# Patient Record
Sex: Male | Born: 1940
Health system: Southern US, Community
[De-identification: ages and names within clinical notes are randomized; demographics above are authoritative.]

## PROBLEM LIST (undated history)

## (undated) ENCOUNTER — Emergency Department (HOSPITAL_COMMUNITY): Admission: EM | Payer: Medicare Other | Source: Home / Self Care

## (undated) DIAGNOSIS — I8390 Asymptomatic varicose veins of unspecified lower extremity: Secondary | ICD-10-CM

## (undated) DIAGNOSIS — Z8709 Personal history of other diseases of the respiratory system: Secondary | ICD-10-CM

## (undated) HISTORY — PX: APPENDECTOMY: SHX54

## (undated) HISTORY — PX: OTHER SURGICAL HISTORY: SHX169

## (undated) HISTORY — PX: TONSILLECTOMY: SUR1361

## (undated) HISTORY — DX: Asymptomatic varicose veins of unspecified lower extremity: I83.90

---

## 2004-06-19 ENCOUNTER — Encounter: Payer: Self-pay | Admitting: Family Medicine

## 2007-09-04 ENCOUNTER — Encounter: Payer: Self-pay | Admitting: Family Medicine

## 2008-08-27 ENCOUNTER — Ambulatory Visit: Payer: Self-pay | Admitting: Family Medicine

## 2008-08-27 DIAGNOSIS — K219 Gastro-esophageal reflux disease without esophagitis: Secondary | ICD-10-CM | POA: Insufficient documentation

## 2008-08-27 DIAGNOSIS — R5381 Other malaise: Secondary | ICD-10-CM | POA: Insufficient documentation

## 2008-08-27 DIAGNOSIS — R5383 Other fatigue: Secondary | ICD-10-CM

## 2008-08-27 DIAGNOSIS — K921 Melena: Secondary | ICD-10-CM | POA: Insufficient documentation

## 2008-08-27 DIAGNOSIS — R0989 Other specified symptoms and signs involving the circulatory and respiratory systems: Secondary | ICD-10-CM

## 2008-08-27 DIAGNOSIS — F341 Dysthymic disorder: Secondary | ICD-10-CM | POA: Insufficient documentation

## 2008-08-27 DIAGNOSIS — R0609 Other forms of dyspnea: Secondary | ICD-10-CM | POA: Insufficient documentation

## 2008-08-29 ENCOUNTER — Ambulatory Visit: Payer: Self-pay | Admitting: Gastroenterology

## 2008-08-30 ENCOUNTER — Ambulatory Visit: Payer: Self-pay | Admitting: Family Medicine

## 2008-08-30 DIAGNOSIS — I872 Venous insufficiency (chronic) (peripheral): Secondary | ICD-10-CM | POA: Insufficient documentation

## 2008-08-30 DIAGNOSIS — F39 Unspecified mood [affective] disorder: Secondary | ICD-10-CM | POA: Insufficient documentation

## 2008-09-11 ENCOUNTER — Ambulatory Visit: Payer: Self-pay | Admitting: Gastroenterology

## 2008-09-11 LAB — HM COLONOSCOPY

## 2008-10-22 ENCOUNTER — Telehealth: Payer: Self-pay | Admitting: Family Medicine

## 2008-12-20 ENCOUNTER — Ambulatory Visit: Payer: Self-pay | Admitting: Family Medicine

## 2009-01-16 ENCOUNTER — Telehealth: Payer: Self-pay | Admitting: Family Medicine

## 2010-02-18 ENCOUNTER — Ambulatory Visit: Payer: Self-pay | Admitting: Family Medicine

## 2010-02-26 ENCOUNTER — Telehealth: Payer: Self-pay | Admitting: Family Medicine

## 2010-03-13 ENCOUNTER — Telehealth: Payer: Self-pay | Admitting: *Deleted

## 2010-03-13 ENCOUNTER — Ambulatory Visit: Payer: Self-pay | Admitting: Family Medicine

## 2010-03-13 DIAGNOSIS — J45909 Unspecified asthma, uncomplicated: Secondary | ICD-10-CM | POA: Insufficient documentation

## 2010-08-02 LAB — CONVERTED CEMR LAB
ALT: 22 units/L (ref 0–53)
ALT: 25 units/L (ref 0–53)
AST: 21 units/L (ref 0–37)
AST: 22 units/L (ref 0–37)
Albumin: 4.1 g/dL (ref 3.5–5.2)
Albumin: 4.2 g/dL (ref 3.5–5.2)
Alkaline Phosphatase: 73 units/L (ref 39–117)
Alkaline Phosphatase: 75 units/L (ref 39–117)
BUN: 19 mg/dL (ref 6–23)
BUN: 24 mg/dL — ABNORMAL HIGH (ref 6–23)
Basophils Absolute: 0 10*3/uL (ref 0.0–0.1)
Basophils Absolute: 0 10*3/uL (ref 0.0–0.1)
Basophils Relative: 0 % (ref 0.0–3.0)
Basophils Relative: 0.4 % (ref 0.0–3.0)
Bilirubin Urine: NEGATIVE
Bilirubin Urine: NEGATIVE
Bilirubin, Direct: 0.2 mg/dL (ref 0.0–0.3)
Bilirubin, Direct: 0.2 mg/dL (ref 0.0–0.3)
Blood in Urine, dipstick: NEGATIVE
CO2: 30 meq/L (ref 19–32)
CO2: 30 meq/L (ref 19–32)
Calcium: 9 mg/dL (ref 8.4–10.5)
Calcium: 9.4 mg/dL (ref 8.4–10.5)
Chloride: 102 meq/L (ref 96–112)
Chloride: 102 meq/L (ref 96–112)
Cholesterol, target level: 200 mg/dL
Cholesterol: 167 mg/dL (ref 0–200)
Cholesterol: 173 mg/dL (ref 0–200)
Creatinine, Ser: 1.1 mg/dL (ref 0.4–1.5)
Creatinine, Ser: 1.1 mg/dL (ref 0.4–1.5)
Eosinophils Absolute: 0.2 10*3/uL (ref 0.0–0.7)
Eosinophils Absolute: 0.2 10*3/uL (ref 0.0–0.7)
Eosinophils Relative: 2.1 % (ref 0.0–5.0)
Eosinophils Relative: 3.2 % (ref 0.0–5.0)
Folate: >20 ng/mL
Free T4: 0.7 ng/dL (ref 0.6–1.6)
GFR calc Af Amer: 86 mL/min
GFR calc non Af Amer: 68.06 mL/min (ref >60)
GFR calc non Af Amer: 71 mL/min
Glucose, Bld: 91 mg/dL (ref 70–99)
Glucose, Bld: 91 mg/dL (ref 70–99)
Glucose, Urine, Semiquant: NEGATIVE
Glucose, Urine, Semiquant: NEGATIVE
HCT: 48.4 % (ref 39.0–52.0)
HCT: 49.6 % (ref 39.0–52.0)
HDL goal, serum: 40 mg/dL
HDL: 43.8 mg/dL (ref >39.00)
HDL: 44.3 mg/dL (ref >39.0)
Hemoglobin: 16.6 g/dL (ref 13.0–17.0)
Hemoglobin: 17.1 g/dL — ABNORMAL HIGH (ref 13.0–17.0)
Iron: 114 ug/dL (ref 42–165)
Ketones, urine, test strip: NEGATIVE
LDL Cholesterol: 100 mg/dL — ABNORMAL HIGH (ref 0–99)
LDL Cholesterol: 108 mg/dL — ABNORMAL HIGH (ref 0–99)
LDL Goal: 160 mg/dL
Lymphocytes Relative: 21.9 % (ref 12.0–46.0)
Lymphocytes Relative: 32.6 % (ref 12.0–46.0)
Lymphs Abs: 2 10*3/uL (ref 0.7–4.0)
MCHC: 34.3 g/dL (ref 30.0–36.0)
MCHC: 34.5 g/dL (ref 30.0–36.0)
MCV: 94.5 fL (ref 78.0–100.0)
MCV: 94.8 fL (ref 78.0–100.0)
Monocytes Absolute: 0.4 10*3/uL (ref 0.1–1.0)
Monocytes Absolute: 0.5 10*3/uL (ref 0.1–1.0)
Monocytes Relative: 4.6 % (ref 3.0–12.0)
Monocytes Relative: 6.5 % (ref 3.0–12.0)
Neutro Abs: 4 10*3/uL (ref 1.4–7.7)
Neutro Abs: 6.5 10*3/uL (ref 1.4–7.7)
Neutrophils Relative %: 57.7 % (ref 43.0–77.0)
Neutrophils Relative %: 71 % (ref 43.0–77.0)
Nitrite: NEGATIVE
Nitrite: NEGATIVE
PSA: 3.08 ng/mL (ref 0.10–4.00)
PSA: 3.39 ng/mL (ref 0.10–4.00)
Platelets: 171 10*3/uL (ref 150–400)
Platelets: 172 10*3/uL (ref 150.0–400.0)
Potassium: 3.8 meq/L (ref 3.5–5.1)
Potassium: 4.3 meq/L (ref 3.5–5.1)
Protein, U semiquant: NEGATIVE
RBC: 5.1 M/uL (ref 4.22–5.81)
RBC: 5.25 M/uL (ref 4.22–5.81)
RDW: 12.3 % (ref 11.5–14.6)
RDW: 13.1 % (ref 11.5–14.6)
Saturation Ratios: 39.1 % (ref 20.0–50.0)
Sodium: 141 meq/L (ref 135–145)
Sodium: 141 meq/L (ref 135–145)
Specific Gravity, Urine: 1.02
Specific Gravity, Urine: 1.025
T3, Free: 3.6 pg/mL (ref 2.3–4.2)
TSH: 0.24 microintl units/mL — ABNORMAL LOW (ref 0.35–5.50)
TSH: 0.53 microintl units/mL (ref 0.35–5.50)
Testosterone: 346.9 ng/dL — ABNORMAL LOW (ref 350.00–890.00)
Total Bilirubin: 1 mg/dL (ref 0.3–1.2)
Total Bilirubin: 1.1 mg/dL (ref 0.3–1.2)
Total CHOL/HDL Ratio: 3.9
Total CHOL/HDL Ratio: 4
Total Protein: 6.5 g/dL (ref 6.0–8.3)
Total Protein: 6.5 g/dL (ref 6.0–8.3)
Transferrin: 208.1 mg/dL — ABNORMAL LOW (ref >=212.0)
Triglycerides: 103 mg/dL (ref 0–149)
Triglycerides: 114 mg/dL (ref 0.0–149.0)
Urobilinogen, UA: 0.2
Urobilinogen, UA: NEGATIVE
VLDL: 21 mg/dL (ref 0–40)
VLDL: 22.8 mg/dL (ref 0.0–40.0)
Vitamin B-12: 594 pg/mL (ref 211–911)
WBC Urine, dipstick: NEGATIVE
WBC Urine, dipstick: NEGATIVE
WBC: 7 10*3/uL (ref 4.5–10.5)
WBC: 9.2 10*3/uL (ref 4.5–10.5)
pH: 5.5
pH: 5.5

## 2010-08-06 NOTE — Progress Notes (Signed)
Summary: rx   Phone Note Call from Patient Call back at Work Phone [phone]   Caller: Patient Call For: Roderick Pee MD Summary of Call: Pt is complaining of cough at night, URI symptoms, and is asking for a Zpack.  Thinks the pneumonia vaccine made him sick.  Initial call taken by: Lynann Beaver CMA,  February 26, 2010 10:25 AM  Follow-up for Phone Call        pneumonia vaccine  did not make him sick.  The symptoms.  He describes are most consistent with a viral infection, which is treated symptomatically with Tylenol and aspirin. Hydromet, 4 ounces one half to 1 teaspoon nightly for cough.  If symptoms persist or he thinks is something else going on there would be happy to see any time Follow-up by: Roderick Pee MD,  February 26, 2010 10:46 AM  Additional Follow-up for Phone Call Additional follow up Details #1::        patient aware rx called in Additional Follow-up by: Kern Reap CMA Duncan Dull),  February 26, 2010 11:03 AM    New/Updated Medications: HYDROMET 5-1.5 MG/5ML SYRP (HYDROCODONE-HOMATROPINE) half to one teaspoon at bedtime as needed for cough Prescriptions: HYDROMET 5-1.5 MG/5ML SYRP (HYDROCODONE-HOMATROPINE) half to one teaspoon at bedtime as needed for cough  #4 oz x 0   Entered by:   Kern Reap CMA (AAMA)   Authorized by:   Roderick Pee MD   Signed by:   Kern Reap CMA (AAMA) on 02/26/2010   Method used:   Historical   RxID:   [id number]

## 2010-08-06 NOTE — Assessment & Plan Note (Signed)
Summary: severe cough/?bronchitus/per Dr. Beatrix Fetters   Vital Signs:  Patient profile:   70 year old male Weight:      234 pounds Temp:     98.3 degrees F oral BP sitting:   130 / 80  (left arm) Cuff size:   regular  Vitals Entered By: Kern Reap CMA Duncan Dull) (March 13, 2010 4:30 PM) CC: cough   CC:  cough.  History of Present Illness: Cory Parker is a 70 year old male, nonsmoker, who comes in with a 3 week history of cough.  He said no fever or sputum production, facial pain, history of reflux, esophagitis, or allergic rhinitis, or asthma in the past.  He states he   He's been treated with prednisone in the past with good results.  A week before the cough started.  He got two puppies   Allergies: No Known Drug Allergies  Past History:  Past medical, surgical, family and social histories (including risk factors) reviewed for relevance to current acute and chronic problems.  Past Medical History: Reviewed history from 08/27/2008 and no changes required. Unremarkable  Past Surgical History: Reviewed history from 08/27/2008 and no changes required. Appendectomy Tonsillectomy finger torn tendon laceration  Family History: Reviewed history from 08/27/2008 and no changes required. Father: heart attack (7) - deceased Mother: heart condition, kidney Siblings: none  Social History: Reviewed history from 08/27/2008 and no changes required. 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 No apparent alopecia or balding. Eyes:  No corneal or conjunctival inflammation noted. EOMI. Perrla. Funduscopic exam benign, without hemorrhages, exudates or papilledema. Vision grossly normal. Ears:  External ear  exam shows no significant lesions or deformities.  Otoscopic examination reveals clear canals, tympanic membranes are intact bilaterally without bulging, retraction, inflammation or discharge. Hearing is grossly normal bilaterally. Nose:  External nasal examination shows no deformity or inflammation. Nasal mucosa are pink and moist without lesions or exudates. Mouth:  Oral mucosa and oropharynx without lesions or exudates.  Teeth in good repair. Neck:  No deformities, masses, or tenderness noted. Chest Wall:  No deformities, masses, tenderness or gynecomastia noted. Lungs:  Normal respiratory effort, chest expands symmetrically. Lungs are clear to auscultation, no crackles or wheezes.   Impression & Recommendations:  Problem # 1:  REACTIVE AIRWAY DISEASE (ICD-493.90) Assessment New  His updated medication list for this problem includes:    Prednisone 20 Mg Tabs (Prednisone) ..... Uad  Complete Medication List: 1)  Calcium 500/d 500-200 Mg-unit Tabs (Calcium carbonate-vitamin d) .... Once daily 2)  Folic Acid 20 Mg Caps (Folic acid) .... Once daily 3)  Vitamin D 1000 Unit Tabs (Cholecalciferol) .... Once daily 4)  Daily Multiple Vitamins Tabs (Multiple vitamin) .... Once daily 5)  Adult Aspirin Ec Low Strength 81 Mg Tbec (Aspirin) .... Once daily 6)  Vitamin C Cr 500 Mg Cr-tabs (Ascorbic acid) .... Once daily 7)  [facility] Vitamin E 1000 Unit Caps (Vitamin e) .... Once daily 8)  Hydrochlorothiazide 25 Mg Tabs (Hydrochlorothiazide) .... Take 1 tablet by mouth every morning 9)  Hydromet 5-1.5 Mg/54ml Syrp (Hydrocodone-homatropine) .... Half to one teaspoon at bedtime as needed for cough 10)  Prednisone 20 Mg Tabs (Prednisone) .... Uad  Patient Instructions: 1)   30 ounces of water daily 2)  Begin prednisone two tabs x 5  days, then one tab x 5 days, a half a tablet x 5 days, then half a tablet Monday, Wednesday, Friday, for a two week taper. 3)  One half to 1 teaspoon of Hytrin at bedtime as  needed for cough. 4)  Return p.r.n. Prescriptions: HYDROMET 5-1.5 MG/5ML SYRP (HYDROCODONE-HOMATROPINE) half to one teaspoon at bedtime as needed for cough  #8oz x 1   Entered and Authorized by:   Roderick Pee MD   Signed by:   Roderick Pee MD on 03/13/2010   Method used:   Print then Give to Patient   RxID:   [id number] PREDNISONE 20 MG TABS (PREDNISONE) UAD  #50 x 1   Entered and Authorized by:   Roderick Pee MD   Signed by:   Roderick Pee MD on 03/13/2010   Method used:   [facility]ron[facility] to        [facility]  [phone]* (retail)       [address] [place], [place]  [address]       Ph: [phone] or [phone]       Fax: [phone]   RxID:   [id number]

## 2010-08-06 NOTE — Assessment & Plan Note (Signed)
Summary: former pt to re establish would like physical/mhf   Vital Signs:  Patient Profile:   70 Years Old Male Weight:      242 pounds Temp:     98.2 degrees F oral BP sitting:   130 / 80  (left arm) Cuff size:   regular  Vitals Entered By: Kern Reap CMA (August 27, 2008 10:41 AM)                 Chief Complaint:  re-establish.  History of Present Illness: Cory Parker is a 70 year old male, who comes in today for evaluation of weight gain dry skin fatigue cold intolerance.  Mood changes, snoring, and reflux esophagitis.  His last medical exam, was 2004.  He, states he's otherwise been in good health.  No problems.  He takes no medication on a regular basis except for some over-the-counter medications, and an 81 mg, baby aspirin.  He gets routine eye care, and dental care, and last tetanus booster 2003    Updated Prior Medication List: CALCIUM 500/D 500-200 MG-UNIT TABS (CALCIUM CARBONATE-VITAMIN D) once daily FOLIC ACID 20 MG CAPS (FOLIC ACID) once daily VITAMIN D 1000 UNIT TABS (CHOLECALCIFEROL) once daily DAILY MULTIPLE VITAMINS  TABS (MULTIPLE VITAMIN) once daily ADULT ASPIRIN EC LOW STRENGTH 81 MG TBEC (ASPIRIN) once daily VITAMIN C CR 500 MG CR-TABS (ASCORBIC ACID) once daily [facility] VITAMIN E 1000 UNIT CAPS (VITAMIN E) once daily  Current Allergies (reviewed today): No known allergies   Past Medical History:    Unremarkable  Past Surgical History:    Reviewed history and no changes required:       Appendectomy       Tonsillectomy       finger torn tendon laceration   Family History:    Reviewed history and no changes required:       Father: heart attack (61) - deceased       Mother: heart condition, kidney       Siblings: none  Social History:    Reviewed history and no changes required:       Occupation:sales        Married       Never Smoked       Alcohol use-yes       Drug use-no       Regular exercise-yes   Risk Factors:  Tobacco use:   never Drug use:  no Alcohol use:  yes Exercise:  yes   Review of Systems      See HPI   Physical Exam  General:     Well-developed,well-nourished,in no acute distress; alert,appropriate and cooperative throughout examination Head:     Normocephalic and atraumatic without obvious abnormalities. No apparent alopecia or balding. Eyes:     No corneal or conjunctival inflammation noted. EOMI. Perrla. Funduscopic exam benign, without hemorrhages, exudates or papilledema. Vision grossly normal. Ears:     External ear exam shows no significant lesions or deformities.  Otoscopic examination reveals clear canals, tympanic membranes are intact bilaterally without bulging, retraction, inflammation or discharge. Hearing is grossly normal bilaterally. Nose:     External nasal examination shows no deformity or inflammation. Nasal mucosa are pink and moist without lesions or exudates. Mouth:     Oral mucosa and oropharynx without lesions or exudates.  Teeth in good repair. Neck:     No deformities, masses, or tenderness noted. Chest Wall:     No deformities, masses, tenderness or gynecomastia noted. Breasts:     No masses  or gynecomastia noted Lungs:     Normal respiratory effort, chest expands symmetrically. Lungs are clear to auscultation, no crackles or wheezes. Heart:     Normal rate and regular rhythm. S1 and S2 normal without gallop, murmur, click, rub or other extra sounds. Abdomen:     Bowel sounds positive,abdomen soft and non-tender without masses, organomegaly or hernias noted. Rectal:     inspection normal stool guaiac-positive.  No history of rectal bleeding, change in bowel habits, etc. never had a colonoscopy Genitalia:     Testes bilaterally descended without nodularity, tenderness or masses. No scrotal masses or lesions. No penis lesions or urethral discharge. Prostate:     Prostate gland firm and smooth, no enlargement, nodularity, tenderness, mass, asymmetry or  induration. Msk:     No deformity or scoliosis noted of thoracic or lumbar spine.   Pulses:     R and L carotid,radial,femoral,dorsalis pedis and posterior tibial pulses are full and equal bilaterally Extremities:     1+ left pedal edema and 1+ right pedal edema.   Neurologic:     No cranial nerve deficits noted. Station and gait are normal. Plantar reflexes are down-going bilaterally. DTRs are symmetrical throughout. Sensory, motor and coordinative functions appear intact. Skin:     Intact without suspicious lesions or rashes Cervical Nodes:     No lymphadenopathy noted Axillary Nodes:     No palpable lymphadenopathy Inguinal Nodes:     No significant adenopathy Psych:     Cognition and judgment appear intact. Alert and cooperative with normal attention span and concentration. No apparent delusions, illusions, hallucinations    Impression & Recommendations:  Problem # 1:  HEMOCCULT POSITIVE STOOL (ICD-578.1) Assessment: New  Orders: Venipuncture (16109) TLB-Lipid Panel (80061-LIPID) TLB-BMP (Basic Metabolic Panel-BMET) (80048-METABOL) TLB-CBC Platelet - w/Differential (85025-CBCD) TLB-Hepatic/Liver Function Pnl (80076-HEPATIC) TLB-TSH (Thyroid Stimulating Hormone) (84443-TSH) TLB-B12 + Folate Pnl (60454_09811-B14/NWG) TLB-IBC Pnl (Iron/FE;Transferrin) (83550-IBC) TLB-PSA (Prostate Specific Antigen) (84153-PSA) TLB-T4 (Thyrox), Free [phone]) TLB-T3, Free (Triiodothyronine) (84481-T3FREE) Gastroenterology Referral (GI)   Problem # 2:  GERD (ICD-530.81) Assessment: New  Orders: Venipuncture (57846) TLB-Lipid Panel (80061-LIPID) TLB-BMP (Basic Metabolic Panel-BMET) (80048-METABOL) TLB-CBC Platelet - w/Differential (85025-CBCD) TLB-Hepatic/Liver Function Pnl (80076-HEPATIC) TLB-TSH (Thyroid Stimulating Hormone) (84443-TSH) TLB-B12 + Folate Pnl (96295_28413-K44/WNU) TLB-IBC Pnl (Iron/FE;Transferrin) (83550-IBC) TLB-PSA (Prostate Specific Antigen)  (84153-PSA) TLB-T4 (Thyrox), Free [phone]) TLB-T3, Free (Triiodothyronine) (84481-T3FREE) EKG w/ Interpretation (93000)   Problem # 3:  ANXIETY DEPRESSION (ICD-300.4) Assessment: New  Orders: Venipuncture (03474) TLB-Lipid Panel (80061-LIPID) TLB-BMP (Basic Metabolic Panel-BMET) (80048-METABOL) TLB-CBC Platelet - w/Differential (85025-CBCD) TLB-Hepatic/Liver Function Pnl (80076-HEPATIC) TLB-TSH (Thyroid Stimulating Hormone) (84443-TSH) TLB-B12 + Folate Pnl (25956_38756-E33/IRJ) TLB-IBC Pnl (Iron/FE;Transferrin) (83550-IBC) TLB-PSA (Prostate Specific Antigen) (84153-PSA) TLB-T4 (Thyrox), Free [phone]) TLB-T3, Free (Triiodothyronine) (84481-T3FREE)   Problem # 4:  SNORING (ICD-786.09) Assessment: New  Orders: Venipuncture (30160) TLB-Lipid Panel (80061-LIPID) TLB-BMP (Basic Metabolic Panel-BMET) (80048-METABOL) TLB-CBC Platelet - w/Differential (85025-CBCD) TLB-Hepatic/Liver Function Pnl (80076-HEPATIC) TLB-TSH (Thyroid Stimulating Hormone) (84443-TSH) TLB-B12 + Folate Pnl (10932_35573-U20/URK) TLB-IBC Pnl (Iron/FE;Transferrin) (83550-IBC) TLB-PSA (Prostate Specific Antigen) (84153-PSA) TLB-T4 (Thyrox), Free [phone]) TLB-T3, Free (Triiodothyronine) (84481-T3FREE) EKG w/ Interpretation (93000)   Problem # 5:  FATIGUE (ICD-780.79) Assessment: New  Orders: Venipuncture (83151) TLB-Lipid Panel (80061-LIPID) TLB-BMP (Basic Metabolic Panel-BMET) (80048-METABOL) TLB-CBC Platelet - w/Differential (85025-CBCD) TLB-Hepatic/Liver Function Pnl (80076-HEPATIC) TLB-TSH (Thyroid Stimulating Hormone) (84443-TSH) TLB-B12 + Folate Pnl (76160_73710-G26/RSW) TLB-IBC Pnl (Iron/FE;Transferrin) (83550-IBC) TLB-PSA (Prostate Specific Antigen) (84153-PSA) TLB-T4 (Thyrox), Free [phone]) TLB-T3, Free (Triiodothyronine) (84481-T3FREE) UA Dipstick w/o Micro (automated)  (81003) EKG w/ Interpretation (93000)   Complete Medication  List: 1)  Calcium 500/d 500-200 Mg-unit  Tabs (Calcium carbonate-vitamin d) .... Once daily 2)  Folic Acid 20 Mg Caps (Folic acid) .... Once daily 3)  Vitamin D 1000 Unit Tabs (Cholecalciferol) .... Once daily 4)  Daily Multiple Vitamins Tabs (Multiple vitamin) .... Once daily 5)  Adult Aspirin Ec Low Strength 81 Mg Tbec (Aspirin) .... Once daily 6)  Vitamin C Cr 500 Mg Cr-tabs (Ascorbic acid) .... Once daily 7)  [facility]vs Vitamin E 1000 Unit Caps (Vitamin e) .... Once daily   Patient Instructions: 1)  return Friday for follow-up     Tetanus/Td Immunization History:    Tetanus/Td # 1:  Historical (07/05/2001)  Laboratory Results   Urine Tests    Routine Urinalysis   Color: yellow Appearance: Clear Glucose: negative   (Normal Range: Negative) Bilirubin: negative   (Normal Range: Negative) Ketone: trace (5)   (Normal Range: Negative) Spec. Gravity: 1.020   (Normal Range: 1.003-1.035) Blood: negative   (Normal Range: Negative) pH: 5.5   (Normal Range: 5.0-8.0) Protein: negative   (Normal Range: Negative) Urobilinogen: 0.2   (Normal Range: 0-1) Nitrite: negative   (Normal Range: Negative) Leukocyte Esterace: negative   (Normal Range: Negative)    Comments: Rita Ohara  August 27, 2008 1:21 PM

## 2010-08-06 NOTE — Assessment & Plan Note (Signed)
Summary: PT WILL COME IN FASTING/NJR   Vital Signs:  Patient profile:   70 year old male Height:      69.5 inches Weight:      231 pounds BMI:     33.75 Temp:     98.0 degrees F oral BP sitting:   130 / 80  (left arm) Cuff size:   regular  Vitals Entered By: Kern Reap CMA Duncan Dull) (February 18, 2010 9:09 AM) CC: wellness exam, Lipid Management Is Patient Diabetic? No   CC:  wellness exam and Lipid Management.  History of Present Illness: [name] is a 70 year old, married man nonsmoker, who continues to work full time, who comes in today for general physical examination and to discuss the edema in his lower extremities.  He has a history of venous insufficiency with bilateral lower extremity edema.  He stopped the diuretic that we gave him........... hydrochlorothiazide 25 mg......... in his leg swelled out.  He restarted the diuretic, and the swelling when a wide.  He does have trouble with superficial varicose veins.  I recommended he see Dr. Hart Rochester for vascular consult.  He's willing to do that....he is also wanting a testosterone level, however, he has no history of erectile dysfunction  He gets routine eye care.  Dental care.  Colonoscopy last year was normal, tetanus, 2003, seasonal flu 2010, Pneumovax today, information given on shingles Here for Medicare AWV:  1.   Risk factors based on Past M, S, F history:,,,,,,,reviewed.  No change 2.   Physical Activities: walks daily 3.   Depression/mood: mood good.  No depression 4.   Hearing: hearing normal 5.   ADL's: ADLs.  Normal 6.   Fall Risk: reviewed none 7.   Home Safety: reviewed.  No guns in the home 8.   Height, weight, &visual acuity:height weight, normal.  Visual acuity normal.  Recent eye exam 9.   Counseling: continue diet exercise program 10.   Labs ordered based on risk factors: done today 11.           Referral Coordination,,,,,,,,referred to Dr. Betti Cruz for vascular evaluation 12.           Care  Plan..........Marland Kitchenreviewed in detail 13.            Cognitive Assessment ...........normal mentation  Lipid Management History:      Positive NCEP/ATP III risk factors include male age 68 years old or older.  Negative NCEP/ATP III risk factors include non-tobacco-user status.    Allergies (verified): No Known Drug Allergies  Past History:  Past medical, surgical, family and social histories (including risk factors) reviewed, and no changes noted (except as noted below).  Past Medical History: Reviewed history from 08/27/2008 and no changes required. Unremarkable  Past Surgical History: Reviewed history from 08/27/2008 and no changes required. Appendectomy Tonsillectomy finger torn tendon laceration  Family History: Reviewed history from 08/27/2008 and no changes required. Father: heart attack (61) - deceased Mother: heart condition, kidney Siblings: none  Social History: Reviewed history from 08/27/2008 and no changes required. 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 No apparent alopecia or balding. Eyes:  No corneal or conjunctival inflammation noted. EOMI. Perrla. Funduscopic exam benign, without hemorrhages, exudates or papilledema. Vision grossly normal. Ears:  External ear exam shows no significant  lesions or deformities.  Otoscopic examination reveals clear canals, tympanic membranes are intact bilaterally without bulging, retraction, inflammation or discharge. Hearing is grossly normal bilaterally. Nose:  External nasal examination shows no deformity or inflammation. Nasal mucosa are pink and moist without lesions or exudates. Mouth:  Oral mucosa and oropharynx without lesions or  exudates.  Teeth in good repair. Neck:  No deformities, masses, or tenderness noted. Chest Wall:  No deformities, masses, tenderness or gynecomastia noted. Breasts:  No masses or gynecomastia noted Lungs:  Normal respiratory effort, chest expands symmetrically. Lungs are clear to auscultation, no crackles or wheezes. Heart:  Normal rate and regular rhythm. S1 and S2 normal without gallop, murmur, click, rub or other extra sounds. Abdomen:  Bowel sounds positive,abdomen soft and non-tender without masses, organomegaly or hernias noted. Rectal:  No external abnormalities noted. Normal sphincter tone. No rectal masses or tenderness. Genitalia:  Testes bilaterally descended without nodularity, tenderness or masses. No scrotal masses or lesions. No penis lesions or urethral discharge. Prostate:  no nodules, no asymmetry, and 1+ enlarged.   Msk:  No deformity or scoliosis noted of thoracic or lumbar spine.   Pulses:  R and L carotid,radial,femoral,dorsalis pedis and posterior tibial pulses are full and equal bilaterally Extremities:  bilateral superficial varicose veins trace edema Neurologic:  No cranial nerve deficits noted. Station and gait are normal. Plantar reflexes are down-going bilaterally. DTRs are symmetrical throughout. Sensory, motor and coordinative functions appear intact. Skin:  Intact without suspicious lesions or rashes Cervical Nodes:  No lymphadenopathy noted Axillary Nodes:  No palpable lymphadenopathy Inguinal Nodes:  No significant adenopathy   Impression & Recommendations:  Problem # 1:  VENOUS INSUFFICIENCY, CHRONIC (ICD-459.81) Assessment Unchanged  Orders: Prescription Created Electronically [phone]) Medicare -1st Annual Wellness Visit [phone]) Urinalysis-dipstick only (Medicare patient) ([id number]) TLB-Lipid Panel (80061-LIPID) TLB-BMP (Basic Metabolic Panel-BMET) (80048-METABOL) TLB-CBC Platelet - w/Differential (85025-CBCD) TLB-Hepatic/Liver Function Pnl  (80076-HEPATIC) TLB-TSH (Thyroid Stimulating Hormone) (84443-TSH) TLB-Testosterone, Total (84403-TESTO) TLB-PSA (Prostate Specific Antigen) (84153-PSA)  Problem # 2:  Preventive Health Care (ICD-V70.0) Assessment: Unchanged  Complete Medication List: 1)  Calcium 500/d 500-200 Mg-unit Tabs (Calcium carbonate-vitamin d) .... Once daily 2)  Folic Acid 20 Mg Caps (Folic acid) .... Once daily 3)  Vitamin D 1000 Unit Tabs (Cholecalciferol) .... Once daily 4)  Daily Multiple Vitamins Tabs (Multiple vitamin) .... Once daily 5)  Adult Aspirin Ec Low Strength 81 Mg Tbec (Aspirin) .... Once daily 6)  Vitamin C Cr 500 Mg Cr-tabs (Ascorbic acid) .... Once daily 7)  [facility]vs Vitamin E 1000 Unit Caps (Vitamin e) .... Once daily 8)  Hydrochlorothiazide 25 Mg Tabs (Hydrochlorothiazide) .... Take 1 tablet by mouth every morning  Other Orders: Venipuncture (62130) Specimen Handling (86578) EKG w/ Interpretation (93000) Pneumococcal Vaccine (46962) Admin 1st Vaccine (95284)  Lipid Assessment/Plan:      Based on NCEP/ATP III, the patient's risk factor category is "0-1 risk factors".  The patient's lipid goals are as follows: Total cholesterol goal is 200; LDL cholesterol goal is 160; HDL cholesterol goal is 40; Triglyceride goal is 150.     Patient Instructions: 1)  called Dr. Betti Cruz for a peripheral vascular evaluation. 2)  Take one aspirin tablet daily, and the hydrochlorothiazide 25 mg daily. 3)  Please schedule a follow-up appointment in 1 year. 4)  It is important that you exercise regularly at least 20 minutes 5 times a week. If you develop chest pain, have severe difficulty breathing, or feel very tired , stop exercising immediately and seek medical  attention. Prescriptions: HYDROCHLOROTHIAZIDE 25 MG TABS (HYDROCHLOROTHIAZIDE) Take 1 tablet by mouth every morning  #100 Tablet x 3   Entered and Authorized by:   Roderick Pee MD   Signed by:   Roderick Pee MD on 02/18/2010   Method used:    Electron[facility] to        [facility]  [phone]* (retail)       [address] [place], [place]  [address]       Ph: [phone] or [phone]       Fax: [phone]   RxID:   [id number]    Immunizations Administered:  Pneumonia Vaccine:    Vaccine Type: Pneumovax    Site: left deltoid    Mfr: Merck    Dose: 0.5 ml    Route: IM    Given by: Kern Reap CMA (AAMA)    Exp. Date: 07/04/2011    Lot #: [id number]    VIS given: 01/31/96 version given February 18, 2010.    Physician counseled: yes   Laboratory Results   Urine Tests  Date/Time Recieved: February 18, 2010 1:19 PM  Date/Time Reported: February 18, 2010 1:19 PM   Routine Urinalysis   Color: yellow Appearance: Clear Glucose: negative   (Normal Range: Negative) Bilirubin: negative   (Normal Range: Negative) Ketone: negative   (Normal Range: Negative) Spec. Gravity: 1.025   (Normal Range: 1.003-1.035) Blood: trace-lysed   (Normal Range: Negative) pH: 5.5   (Normal Range: 5.0-8.0) Protein: trace   (Normal Range: Negative) Urobilinogen: negative   (Normal Range: 0-1) Nitrite: negative   (Normal Range: Negative) Leukocyte Esterace: negative   (Normal Range: Negative)    Comments: Wynona Canes, CMA  February 18, 2010 1:19 PM

## 2010-08-06 NOTE — Progress Notes (Signed)
Summary: Pt requesting zpak for cough  Phone Note Call from Patient Call back at Work Phone [phone]   Caller: vm Call For: Britley Gashi Summary of Call: Hydrocodone will be running out tonight.  One hell of a cough. Think bronchitis.  Cough can't get rid of.   Feather tickle in throat all the time.  Tired of sucking on cough drop or candy.  Zpak or something to [facility].   Initial call taken by: Rudy Jew, RN,  March 13, 2010 12:26 PM  Follow-up for Phone Call        ov today ....... Follow-up by: Roderick Pee MD,  March 13, 2010 12:37 PM  Additional Follow-up for Phone Call Additional follow up Details #1::        call Pt he is coming into office today at 4:15 Additional Follow-up by: Kathrynn Speed CMA,  March 13, 2010 1:21 PM

## 2011-02-21 ENCOUNTER — Other Ambulatory Visit: Payer: Self-pay | Admitting: Family Medicine

## 2011-04-08 ENCOUNTER — Other Ambulatory Visit (INDEPENDENT_AMBULATORY_CARE_PROVIDER_SITE_OTHER): Payer: Commercial Managed Care - PPO

## 2011-04-08 DIAGNOSIS — Z0000 Encounter for general adult medical examination without abnormal findings: Secondary | ICD-10-CM

## 2011-04-08 LAB — CBC WITH DIFFERENTIAL/PLATELET
Basophils Absolute: 0 10*3/uL (ref 0.0–0.1)
Basophils Relative: 0.5 % (ref 0.0–3.0)
Eosinophils Absolute: 0.2 10*3/uL (ref 0.0–0.7)
Eosinophils Relative: 3.9 % (ref 0.0–5.0)
HCT: 47.4 % (ref 39.0–52.0)
Hemoglobin: 16.1 g/dL (ref 13.0–17.0)
Lymphocytes Relative: 37.7 % (ref 12.0–46.0)
Lymphs Abs: 2.3 10*3/uL (ref 0.7–4.0)
MCHC: 33.9 g/dL (ref 30.0–36.0)
MCV: 95.5 fl (ref 78.0–100.0)
Monocytes Absolute: 0.3 10*3/uL (ref 0.1–1.0)
Monocytes Relative: 5.3 % (ref 3.0–12.0)
Neutro Abs: 3.2 10*3/uL (ref 1.4–7.7)
Neutrophils Relative %: 52.6 % (ref 43.0–77.0)
Platelets: 168 10*3/uL (ref 150.0–400.0)
RBC: 4.96 Mil/uL (ref 4.22–5.81)
RDW: 13.4 % (ref 11.5–14.6)
WBC: 6 10*3/uL (ref 4.5–10.5)

## 2011-04-08 LAB — POCT URINALYSIS DIPSTICK
Bilirubin, UA: NEGATIVE
Blood, UA: NEGATIVE
Glucose, UA: NEGATIVE
Ketones, UA: NEGATIVE
Leukocytes, UA: NEGATIVE
Nitrite, UA: NEGATIVE
Protein, UA: NEGATIVE
Spec Grav, UA: 1.025
Urobilinogen, UA: 0.2
pH, UA: 6

## 2011-04-08 LAB — BASIC METABOLIC PANEL
BUN: 23 mg/dL (ref 6–23)
CO2: 28 mEq/L (ref 19–32)
Calcium: 9.1 mg/dL (ref 8.4–10.5)
Chloride: 109 mEq/L (ref 96–112)
Creatinine, Ser: 1.1 mg/dL (ref 0.4–1.5)
GFR: 74.59 mL/min (ref >60.00)
Glucose, Bld: 98 mg/dL (ref 70–99)
Potassium: 5.2 mEq/L — ABNORMAL HIGH (ref 3.5–5.1)
Sodium: 142 mEq/L (ref 135–145)

## 2011-04-08 LAB — HEPATIC FUNCTION PANEL
ALT: 19 U/L (ref 0–53)
AST: 19 U/L (ref 0–37)
Albumin: 4 g/dL (ref 3.5–5.2)
Alkaline Phosphatase: 70 U/L (ref 39–117)
Bilirubin, Direct: 0.2 mg/dL (ref 0.0–0.3)
Total Bilirubin: 0.9 mg/dL (ref 0.3–1.2)
Total Protein: 6.4 g/dL (ref 6.0–8.3)

## 2011-04-08 LAB — TSH: TSH: 0.22 u[IU]/mL — ABNORMAL LOW (ref 0.35–5.50)

## 2011-04-08 LAB — LIPID PANEL
Cholesterol: 159 mg/dL (ref 0–200)
HDL: 41.9 mg/dL (ref >39.00)
LDL Cholesterol: 93 mg/dL (ref 0–99)
Total CHOL/HDL Ratio: 4
Triglycerides: 120 mg/dL (ref 0.0–149.0)
VLDL: 24 mg/dL (ref 0.0–40.0)

## 2011-04-08 LAB — PSA: PSA: 2.8 ng/mL (ref 0.10–4.00)

## 2011-04-14 ENCOUNTER — Encounter: Payer: Self-pay | Admitting: Family Medicine

## 2011-04-15 ENCOUNTER — Encounter: Payer: Self-pay | Admitting: Family Medicine

## 2011-04-15 ENCOUNTER — Ambulatory Visit (INDEPENDENT_AMBULATORY_CARE_PROVIDER_SITE_OTHER): Payer: Commercial Managed Care - PPO | Admitting: Family Medicine

## 2011-04-15 VITALS — BP 122/70 | Temp 98.2°F | Ht 70.0 in | Wt 236.0 lb

## 2011-04-15 DIAGNOSIS — I872 Venous insufficiency (chronic) (peripheral): Secondary | ICD-10-CM

## 2011-04-15 DIAGNOSIS — Z0000 Encounter for general adult medical examination without abnormal findings: Secondary | ICD-10-CM

## 2011-04-15 DIAGNOSIS — Z23 Encounter for immunization: Secondary | ICD-10-CM

## 2011-04-15 MED ORDER — FUROSEMIDE 20 MG PO TABS: 20.0000 mg | ORAL_TABLET | Freq: Every day | ORAL | Status: DC

## 2011-04-15 NOTE — Progress Notes (Signed)
  Subjective:    Patient ID: Cory Parker, male    DOB: 01-Dec-1940, 70 y.o.   MRN: [id number]  HPI Cory Parker is a 70 year-old, married male, nonsmoker Who comes in today for a general Medicare wellness examination.  He has a history of venous insufficiency and edema of his lower extremities.  He was taking hydrochlorothiazide 25 mg daily however, he says it made him feel fatigued.  Therefore, he stopped the medication.  He gets routine eye care, hearing normal, regular dental care, colonoscopy 2010, normal, tetanus, 2003, Pneumovax 2011, seasonal flu shot today, information given on shingles.  Activities of daily living.  Normal.  He still works on a part-time basis.  Cognitive function, normal.  Home health safety reviewed.  No issues identified, no guns in the house, he does have a healthcare power of attorney and living will.   Review of Systems  Constitutional: Negative.   HENT: Negative.   Eyes: Negative.   Respiratory: Negative.   Cardiovascular: Negative.   Gastrointestinal: Negative.   Genitourinary: Negative.   Musculoskeletal: Negative.   Skin: Negative.   Neurological: Negative.   Hematological: Negative.   Psychiatric/Behavioral: Negative.        Objective:   Physical Exam  Constitutional: He is oriented to person, place, and time. He appears well-developed and well-nourished.  HENT:  Head: Normocephalic and atraumatic.  Right Ear: External ear normal.  Left Ear: External ear normal.  Nose: Nose normal.  Mouth/Throat: Oropharynx is clear and moist.  Eyes: Conjunctivae and EOM are normal. Pupils are equal, round, and reactive to light.  Neck: Normal range of motion. Neck supple. No JVD present. No tracheal deviation present. No thyromegaly present.  Cardiovascular: Normal rate, regular rhythm, normal heart sounds and intact distal pulses.  Exam reveals no gallop and no friction rub.   No murmur heard.      Venous insufficiency, with 2+ edema bilaterally    Pulmonary/Chest: Effort normal and breath sounds normal. No stridor. No respiratory distress. He has no wheezes. He has no rales. He exhibits no tenderness.  Abdominal: Soft. Bowel sounds are normal. He exhibits no distension and no mass. There is no tenderness. There is no rebound and no guarding.  Genitourinary: Rectum normal, prostate normal and penis normal. Guaiac negative stool. No penile tenderness.  Musculoskeletal: Normal range of motion. He exhibits no edema and no tenderness.  Lymphadenopathy:    He has no cervical adenopathy.  Neurological: He is alert and oriented to person, place, and time. He has normal reflexes. No cranial nerve deficit. He exhibits normal muscle tone.  Skin: Skin is warm and dry. No rash noted. No erythema. No pallor.       Total body skin exam normal.  Multiple seborrheic keratoses on the body also on the one in the right scalp above the year  Psychiatric: He has a normal mood and affect. His behavior is normal. Judgment and thought content normal.          Assessment & Plan:  Healthy male.  History of venous insufficiency, Lasix 20 mg daily p.r.n. Declines stockings.  Return in one year, sooner for any problems

## 2011-04-15 NOTE — Patient Instructions (Signed)
Continue your good health habits.  Lasix 20 mg........ One tablet daily, as needed for fluid retention.  Return in one year for your annual physical examination sooner if any problems

## 2011-09-24 ENCOUNTER — Telehealth: Payer: Self-pay | Admitting: Family Medicine

## 2011-09-24 NOTE — Telephone Encounter (Signed)
Patient called stating that he would like to have a shingle shot rx sent to [facility] on [address]. Please assist.

## 2011-09-27 MED ORDER — ZOSTER VACCINE LIVE 19400 UNT/0.65ML ~~LOC~~ SOLR: 0.6500 mL | Freq: Once | SUBCUTANEOUS | Status: AC

## 2011-09-27 NOTE — Telephone Encounter (Signed)
rx sent to pharmacy

## 2012-08-23 ENCOUNTER — Ambulatory Visit (INDEPENDENT_AMBULATORY_CARE_PROVIDER_SITE_OTHER): Payer: Medicare Other | Admitting: Family Medicine

## 2012-08-23 ENCOUNTER — Encounter: Payer: Self-pay | Admitting: Family Medicine

## 2012-08-23 ENCOUNTER — Telehealth: Payer: Self-pay | Admitting: Family Medicine

## 2012-08-23 VITALS — BP 140/90 | Temp 98.6°F | Wt 232.0 lb

## 2012-08-23 DIAGNOSIS — J069 Acute upper respiratory infection, unspecified: Secondary | ICD-10-CM

## 2012-08-23 DIAGNOSIS — J45909 Unspecified asthma, uncomplicated: Secondary | ICD-10-CM

## 2012-08-23 MED ORDER — PREDNISONE 20 MG PO TABS: ORAL_TABLET | ORAL | Status: DC

## 2012-08-23 MED ORDER — HYDROCODONE-HOMATROPINE 5-1.5 MG/5ML PO SYRP: ORAL_SOLUTION | ORAL | Status: DC

## 2012-08-23 NOTE — Telephone Encounter (Signed)
Appt made for 11:30 - encounter closed.

## 2012-08-23 NOTE — Telephone Encounter (Signed)
Patient Information:  Caller Name: Drue Flirt  Phone: [phone]  Patient: Cory Parker, Cory Parker  Gender: Male  DOB: 05-19-1941  Age: 72 Years  PCP: Kelle Darting [facility])  Office Follow Up:  Does the office need to follow up with this patient?: Yes  Instructions For The Office:  OFFICE FOLLOWUP REQUEST  -- Caller is requesting appt with Dr. Tawanna Cooler for today for cough since 08/16/2012 , and no appts seen that were available  RN Note:  RN attempted to scheduled appt today with Dr Tawanna Cooler, but there were no available appts. RN will send note to office per profile orders   Symptoms  Reason For Call & Symptoms: Today, 08/23/2012 , wife calling wanting appt for pt who has had cough since 08/16/2012 .  Waking at night often with cough.  Pt is at work during call and not with wife -- RN unable to triage.  Wife is requesting appt for today  Reviewed Health History In EMR: N/A  Reviewed Medications In EMR: N/A  Reviewed Allergies In EMR: N/A  Reviewed Surgeries / Procedures: N/A  Date of Onset of Symptoms: 08/16/2012  Treatments Tried: Rest, fluids, Mucinex,  Treatments Tried Worked: No  Guideline(s) Used:  No Protocol Available - Sick Adult  Disposition Per Guideline:   See Today in Office  Reason For Disposition Reached:   Patient wants to be seen  Advice Given:  N/A

## 2012-08-23 NOTE — Patient Instructions (Addendum)
Drink lots of water  Take the prednisone as directed  Hydromet 1/2-1 teaspoon at bedtime when necessary for cough  A vaporizer  Return when necessary

## 2012-08-23 NOTE — Progress Notes (Signed)
  Subjective:    Patient ID: Cory Parker, male    DOB: 1941/04/12, 72 y.o.   MRN: [id number]  HPI Cory Parker is a 72 year old male nonsmoker who comes in today for evaluation of a  cough for 6 days  About 6 days ago he developed a cough. No fever earache etc. Last Friday he went to an urgent care center and was given azithromycin with no indications  He's had a history of asthma when he gets a viral infection   Review of Systems Review of systems otherwise negative he is a nonsmoker    Objective:   Physical Exam Well-developed well-nourished male in no acute distress HEENT negative neck was supple no adenopathy lungs are clear except for symmetrical late expiratory wheezing       Assessment & Plan:  Viral syndrome with secondary asthma plan prednisone burst and taper

## 2013-02-22 ENCOUNTER — Telehealth: Payer: Self-pay | Admitting: Family Medicine

## 2013-02-22 NOTE — Telephone Encounter (Addendum)
Received letter from patient requesting amendment of Health Information 02/14/13. Mailed forms to patient  DAJ  Received Request for Amendment & Authorization forms from patient 02/23/13 [facility]  Request for Amendment was denied by Kelle Darting MD 03/07/2013 [facility]  The Denial letter & Provider Denial form sent to patient through Certified Mail  03/13/13 DAJ

## 2013-03-15 ENCOUNTER — Telehealth: Payer: Self-pay | Admitting: Family Medicine

## 2013-03-15 NOTE — Telephone Encounter (Signed)
Received signed domestic return receipt verifying delivery of certified letter on March 15, 2013. Article number [id number]

## 2013-03-30 ENCOUNTER — Ambulatory Visit (INDEPENDENT_AMBULATORY_CARE_PROVIDER_SITE_OTHER): Payer: Medicare Other

## 2013-03-30 ENCOUNTER — Ambulatory Visit (INDEPENDENT_AMBULATORY_CARE_PROVIDER_SITE_OTHER): Payer: Medicare Other | Admitting: *Deleted

## 2013-03-30 DIAGNOSIS — Z23 Encounter for immunization: Secondary | ICD-10-CM

## 2013-04-03 ENCOUNTER — Other Ambulatory Visit: Payer: Self-pay | Admitting: Vascular Surgery

## 2013-04-03 DIAGNOSIS — I8390 Asymptomatic varicose veins of unspecified lower extremity: Secondary | ICD-10-CM

## 2013-04-23 ENCOUNTER — Encounter: Payer: Self-pay | Admitting: Vascular Surgery

## 2013-04-24 ENCOUNTER — Ambulatory Visit (INDEPENDENT_AMBULATORY_CARE_PROVIDER_SITE_OTHER): Payer: Medicare Other | Admitting: Vascular Surgery

## 2013-04-24 ENCOUNTER — Encounter: Payer: Self-pay | Admitting: Vascular Surgery

## 2013-04-24 ENCOUNTER — Encounter (INDEPENDENT_AMBULATORY_CARE_PROVIDER_SITE_OTHER): Payer: Self-pay

## 2013-04-24 ENCOUNTER — Ambulatory Visit (HOSPITAL_COMMUNITY)
Admission: RE | Admit: 2013-04-24 | Discharge: 2013-04-24 | Disposition: A | Discharge status: Home / Self Care | Payer: Medicare Other | Source: Ambulatory Visit | Attending: Vascular Surgery | Admitting: Vascular Surgery

## 2013-04-24 VITALS — BP 167/81 | HR 58 | Resp 16 | Ht 71.0 in | Wt 225.0 lb

## 2013-04-24 DIAGNOSIS — I83893 Varicose veins of bilateral lower extremities with other complications: Secondary | ICD-10-CM

## 2013-04-24 DIAGNOSIS — I8390 Asymptomatic varicose veins of unspecified lower extremity: Secondary | ICD-10-CM | POA: Insufficient documentation

## 2013-04-24 NOTE — Progress Notes (Signed)
Subjective:     Patient ID: Cory Parker, male   DOB: [birth year], 73 y.o.   MRN: [id number]  HPI this 72 year old male was referred by Dr. Alonza Smoker for evaluation of bulging painful varicose veins in the right leg. Patient has had veins which are prominent in the right calf for many years but recently has developed increasing swelling as the day progresses as well as some darkening and thickening of the skin in the lower third of the right leg. He has no history of DVT, thrombophlebitis, stasis ulcers, or bleeding. He does notice heaviness in the right leg as the day progresses which is consistent with the edema. It is not wear long leg elastic compression stockings nor elevate his legs or regular basis.  History reviewed. No pertinent past medical history.  History  Substance Use Topics  . Smoking status: Never Smoker   . Smokeless tobacco: Never Used  . Alcohol Use: 0.6 oz/week    1 Cans of beer per week    Family History  Problem Relation Age of Onset  . Kidney disease Mother   . Heart disease Mother   . Heart attack Father     No Known Allergies  Current outpatient prescriptions:aspirin 81 MG tablet, Take 81 mg by mouth daily.  , Disp: , Rfl: ;  calcium-vitamin D (OSCAL WITH D) 500-200 MG-UNIT per tablet, Take 1 tablet by mouth daily.  , Disp: , Rfl: ;  cholecalciferol (VITAMIN D) 1000 UNITS tablet, Take 1,000 Units by mouth daily.  , Disp: , Rfl: ;  DAILY MULTIPLE VITAMINS PO, Take by mouth daily.  , Disp: , Rfl: ;  Folic Acid 20 MG CAPS, Take by mouth daily.  , Disp: , Rfl:  HYDROcodone-homatropine (HYCODAN) 5-1.5 MG/5ML syrup, 1/2-1 teaspoon each bedtime when necessary for cough, Disp: 120 mL, Rfl: 1;  predniSONE (DELTASONE) 20 MG tablet, 2 tabs x3 days, 1 tab x3 days, a half a tab x3 days, then a half a tablet Monday Wednesday Friday for a two-week taper, Disp: 40 tablet, Rfl: 1;  pyridoxine (B-6) 100 MG tablet, Take 100 mg by mouth daily.  , Disp: , Rfl:  vitamin C (ASCORBIC  ACID) 500 MG tablet, Take 500 mg by mouth daily.  , Disp: , Rfl: ;  vitamin E 1000 UNIT capsule, Take 1,000 Units by mouth daily.  , Disp: , Rfl: ;  furosemide (LASIX) 20 MG tablet, Take 1 tablet (20 mg total) by mouth daily., Disp: 100 tablet, Rfl: 3  BP 167/81  Pulse 58  Resp 16  Ht 5\' 11"  (1.803 m)  Wt 225 lb (102.059 kg)  BMI 31.39 kg/m2  Body mass index is 31.39 kg/(m^2).           Review of Systems denies chest pain, dyspnea on exertion, PND, orthopnea, hemoptysis, claudication. All systems negative and complete review of systems    Objective:   Physical Exam BP 167/81  Pulse 58  Resp 16  Ht 5\' 11"  (1.803 m)  Wt 225 lb (102.059 kg)  BMI 31.39 kg/m2  Gen.-alert and oriented x3 in no apparent distress HEENT normal for age Lungs no rhonchi or wheezing Cardiovascular regular rhythm no murmurs carotid pulses 3+ palpable no bruits audible Abdomen soft nontender no palpable masses Musculoskeletal free of  major deformities Skin clear -no rashes Neurologic normal Lower extremities 3+ femoral and dorsalis pedis pulses palpable bilaterally with 1+ edema right ankle no edema left leg Right leg with bulging varicosities along the great saphenous  system distribution in the calf with early hyperpigmentation lower third right leg and prominent reticular and spider veins around malleolar area-no active ulcers  Today I ordered a venous duplex exam of the right leg which are reviewed and interpreted. He does have gross reflux throughout the right great saphenous vein from the knee to the saphenofemoral junction with no DVT.     Assessment:     Painful varicose veins right leg due to gross reflux right great saphenous system with distal edema and early skin changes    Plan:     #1 long-leg elastic compression stockings 20-30 mm gradient #2 elevate legs as much as possible during the day #3 ibuprofen on a daily basis #4 return in 3 months. If no dramatic improvement patient  needs #1 laser ablation right great saphenous vein and then to return in 3 months to see if stab phlebectomy of secondary varicosities will be indicated

## 2013-05-14 ENCOUNTER — Encounter: Payer: Self-pay | Admitting: Vascular Surgery

## 2013-07-30 ENCOUNTER — Encounter: Payer: Self-pay | Admitting: Vascular Surgery

## 2013-07-31 ENCOUNTER — Ambulatory Visit (INDEPENDENT_AMBULATORY_CARE_PROVIDER_SITE_OTHER): Payer: Medicare Other | Admitting: Vascular Surgery

## 2013-07-31 ENCOUNTER — Encounter: Payer: Self-pay | Admitting: Vascular Surgery

## 2013-07-31 VITALS — BP 168/93 | HR 60 | Resp 18 | Ht 71.0 in | Wt 231.0 lb

## 2013-07-31 DIAGNOSIS — I83893 Varicose veins of bilateral lower extremities with other complications: Secondary | ICD-10-CM | POA: Insufficient documentation

## 2013-07-31 NOTE — Progress Notes (Signed)
Subjective:     Patient ID: Cory Parker, male   DOB: January 19, 1941, 73 y.o.   MRN: [id number]  HPI this 73 year old male returns for continued followup regarding his severe painful varicose veins in the right lower extremity due to reflux in the right great saphenous system. He continues to have aching throbbing and burning discomfort. He has tried long-leg elastic compression stockings 20-30 mm gradient as well as elevation and ibuprofen with no success. This is affecting his daily living.  Past Medical History  Diagnosis Date  . Varicose veins     History  Substance Use Topics  . Smoking status: Never Smoker   . Smokeless tobacco: Never Used  . Alcohol Use: 0.6 oz/week    1 Cans of beer per week    Family History  Problem Relation Age of Onset  . Kidney disease Mother   . Heart disease Mother   . Heart attack Father     No Known Allergies  Current outpatient prescriptions:aspirin 81 MG tablet, Take 81 mg by mouth daily.  , Disp: , Rfl: ;  calcium-vitamin D (OSCAL WITH D) 500-200 MG-UNIT per tablet, Take 1 tablet by mouth daily.  , Disp: , Rfl: ;  cholecalciferol (VITAMIN D) 1000 UNITS tablet, Take 1,000 Units by mouth daily.  , Disp: , Rfl: ;  DAILY MULTIPLE VITAMINS PO, Take by mouth daily.  , Disp: , Rfl:  pyridoxine (B-6) 100 MG tablet, Take 100 mg by mouth daily.  , Disp: , Rfl: ;  vitamin C (ASCORBIC ACID) 500 MG tablet, Take 500 mg by mouth daily.  , Disp: , Rfl: ;  vitamin E 1000 UNIT capsule, Take 1,000 Units by mouth daily.  , Disp: , Rfl: ;  Folic Acid 20 MG CAPS, Take by mouth daily.  , Disp: , Rfl: ;  furosemide (LASIX) 20 MG tablet, Take 1 tablet (20 mg total) by mouth daily., Disp: 100 tablet, Rfl: 3 HYDROcodone-homatropine (HYCODAN) 5-1.5 MG/5ML syrup, 1/2-1 teaspoon each bedtime when necessary for cough, Disp: 120 mL, Rfl: 1;  predniSONE (DELTASONE) 20 MG tablet, 2 tabs x3 days, 1 tab x3 days, a half a tab x3 days, then a half a tablet Monday Wednesday Friday for a  two-week taper, Disp: 40 tablet, Rfl: 1  BP 168/93  Pulse 60  Resp 18  Ht 5\' 11"  (1.803 m)  Wt 231 lb (104.781 kg)  BMI 32.23 kg/m2  Body mass index is 32.23 kg/(m^2).          Review of Systems denies chest pain, dyspnea on exertion, PND, orthopnea, hemoptysis    Objective:   Physical Exam BP 168/93  Pulse 60  Resp 18  Ht 5\' 11"  (1.803 m)  Wt 231 lb (104.781 kg)  BMI 32.23 kg/m2  General well-developed well-nourished male no apparent stress oriented x3 Lungs no rhonchi or wheezing Right leg with bulging varicosities in the great saphenous system beginning in the distal thigh extending to the ankle with 1+ edema. No active hyper pigmentation or ulceration is noted.      Assessment:     Gross reflux right great saphenous system causing painful bulging varicosities right leg-affecting patient's living and resistant to conservative measures    Plan:     Patient needs laser ablation right great saphenous vein and then return in 3 months to see if stab phlebectomy of painful varicosities will be indicated. We'll proceed with precertification perform this in the near future

## 2013-08-14 ENCOUNTER — Other Ambulatory Visit: Payer: Self-pay | Admitting: *Deleted

## 2013-08-14 DIAGNOSIS — I83893 Varicose veins of bilateral lower extremities with other complications: Secondary | ICD-10-CM

## 2013-08-24 ENCOUNTER — Encounter: Payer: Self-pay | Admitting: Vascular Surgery

## 2013-08-27 ENCOUNTER — Ambulatory Visit (INDEPENDENT_AMBULATORY_CARE_PROVIDER_SITE_OTHER): Payer: Medicare Other | Admitting: Vascular Surgery

## 2013-08-27 ENCOUNTER — Encounter: Payer: Self-pay | Admitting: Vascular Surgery

## 2013-08-27 VITALS — BP 157/86 | HR 60 | Resp 16 | Ht 71.0 in | Wt 225.0 lb

## 2013-08-27 DIAGNOSIS — I83893 Varicose veins of bilateral lower extremities with other complications: Secondary | ICD-10-CM

## 2013-08-27 NOTE — Progress Notes (Signed)
   Laser Ablation Procedure      Date: 08/27/2013    Cory Parker DOB:[birth year]  Consent signed: Yes  Surgeon:J.D. Kellie Simmering  Procedure: Laser Ablation: right Greater Saphenous Vein  BP 157/86  Pulse 60  Resp 16  Ht 5\' 11"  (1.803 m)  Wt 225 lb (102.059 kg)  BMI 31.39 kg/m2  Start time: 9   End time: 9:40  Tumescent Anesthesia: 400 cc 0.9% NaCl with 50 cc Lidocaine HCL with 1% Epi and 15 cc 8.4% NaHCO3  Local Anesthesia: 5 cc Lidocaine HCL and NaHCO3 (ratio 2:1)  Pulsed mode: 15watts 500 ms delay 1.0 duration Total energy: 2580 total pulses: 173  Total time: 2:52     Patient tolerated procedure well: Yes  Notes:   Description of Procedure:  After marking the course of the saphenous vein and the secondary varicosities in the standing position, the patient was placed on the operating table in the supine position, and the right leg was prepped and draped in sterile fashion. Local anesthetic was administered, and under ultrasound guidance the saphenous vein was accessed with a micro needle and guide wire; then the micro puncture sheath was placed. A guide wire was inserted to the saphenofemoral junction, followed by a 5 french sheath.  The position of the sheath and then the laser fiber below the junction was confirmed using the ultrasound and visualization of the aiming beam.  Tumescent anesthesia was administered along the course of the saphenous vein using ultrasound guidance. Protective laser glasses were placed on the patient, and the laser was fired at 15 watt pulsed mode advancing 1-2 mm per sec.  For a total of 2580 joules.  A steri strip was applied to the puncture site.    ABD pads and thigh high compression stockings were applied.  Ace wrap bandages were applied over the phlebectomy sites and at the top of the saphenofemoral junction.  Blood loss was less than 15 cc.  The patient ambulated out of the operating room having tolerated the procedure well.

## 2013-08-27 NOTE — Progress Notes (Signed)
Subjective:     Patient ID: Cory Parker, male   DOB: January 20, 1941, 74 y.o.   MRN: [id number]  HPI this 73 year old male had laser ablation right great saphenous vein from the proximal calf to the saphenofemoral junction performed under local tumescent anesthesia for venous hypertension with painful varicosities. He tolerated shooter well. A total of 2580 J of energy was utilized.  Review of Systems     Objective:   Physical Exam BP 157/86  Pulse 60  Resp 16  Ht 5\' 11"  (1.803 m)  Wt 225 lb (102.059 kg)  BMI 31.39 kg/m2        Assessment:     Well-tolerated laser ablation right great saphenous vein performed under local tumescent anesthesia    Plan:     Return in one week for venous duplex exam to confirm closure right great saphenous vein Will then return in 3 months to see a stab phlebectomy is indicated for painful residual varicosities.

## 2013-08-28 ENCOUNTER — Telehealth: Payer: Self-pay | Admitting: *Deleted

## 2013-08-28 NOTE — Telephone Encounter (Signed)
Pt doing well. Some sore spots but nothing bad. Following all instructions. Reminded him of his fu appt.

## 2013-08-31 ENCOUNTER — Encounter: Payer: Self-pay | Admitting: Vascular Surgery

## 2013-09-03 ENCOUNTER — Ambulatory Visit (HOSPITAL_COMMUNITY)
Admission: RE | Admit: 2013-09-03 | Discharge: 2013-09-03 | Disposition: A | Discharge status: Home / Self Care | Payer: Medicare Other | Source: Ambulatory Visit | Attending: Vascular Surgery | Admitting: Vascular Surgery

## 2013-09-03 ENCOUNTER — Ambulatory Visit: Payer: Medicare Other | Admitting: Vascular Surgery

## 2013-09-03 ENCOUNTER — Encounter: Payer: Self-pay | Admitting: Vascular Surgery

## 2013-09-03 ENCOUNTER — Ambulatory Visit (INDEPENDENT_AMBULATORY_CARE_PROVIDER_SITE_OTHER): Payer: Medicare Other | Admitting: Vascular Surgery

## 2013-09-03 ENCOUNTER — Encounter (HOSPITAL_COMMUNITY): Payer: Medicare Other

## 2013-09-03 VITALS — BP 145/79 | HR 78 | Resp 16 | Ht 71.0 in | Wt 225.0 lb

## 2013-09-03 DIAGNOSIS — I83893 Varicose veins of bilateral lower extremities with other complications: Secondary | ICD-10-CM | POA: Insufficient documentation

## 2013-09-03 NOTE — Progress Notes (Signed)
Subjective:     Patient ID: Cory Parker, male   DOB: 1940/10/10, 73 y.o.   MRN: [id number]  HPI this 73 year old male returns 1 week post laser ablation right great saphenous vein from proximal Junior saphenofemoral junction. He is in moderate discomfort along the course of the great saphenous vein which has improved over the last several days. He is wearing his long-leg elastic compression stocking and elevating the leg as instructed. He has taken some of the ibuprofen but had some GI system complaints so has backed off of that somewhat. He states that the discoloration of the right ankle areas are significantly improved from prior to the procedure.  Past Medical History  Diagnosis Date  . Varicose veins     History  Substance Use Topics  . Smoking status: Never Smoker   . Smokeless tobacco: Never Used  . Alcohol Use: 0.6 oz/week    1 Cans of beer per week    Family History  Problem Relation Age of Onset  . Kidney disease Mother   . Heart disease Mother   . Heart attack Father     No Known Allergies  Current outpatient prescriptions:aspirin 81 MG tablet, Take 81 mg by mouth daily.  , Disp: , Rfl: ;  calcium-vitamin D (OSCAL WITH D) 500-200 MG-UNIT per tablet, Take 1 tablet by mouth daily.  , Disp: , Rfl: ;  cholecalciferol (VITAMIN D) 1000 UNITS tablet, Take 1,000 Units by mouth daily.  , Disp: , Rfl: ;  DAILY MULTIPLE VITAMINS PO, Take by mouth daily.  , Disp: , Rfl: ;  Folic Acid 20 MG CAPS, Take by mouth daily.  , Disp: , Rfl:  furosemide (LASIX) 20 MG tablet, Take 1 tablet (20 mg total) by mouth daily., Disp: 100 tablet, Rfl: 3;  HYDROcodone-homatropine (HYCODAN) 5-1.5 MG/5ML syrup, 1/2-1 teaspoon each bedtime when necessary for cough, Disp: 120 mL, Rfl: 1;  predniSONE (DELTASONE) 20 MG tablet, 2 tabs x3 days, 1 tab x3 days, a half a tab x3 days, then a half a tablet Monday Wednesday Friday for a two-week taper, Disp: 40 tablet, Rfl: 1 pyridoxine (B-6) 100 MG tablet, Take 100 mg  by mouth daily.  , Disp: , Rfl: ;  vitamin C (ASCORBIC ACID) 500 MG tablet, Take 500 mg by mouth daily.  , Disp: , Rfl: ;  vitamin E 1000 UNIT capsule, Take 1,000 Units by mouth daily.  , Disp: , Rfl:   BP 145/79  Pulse 78  Resp 16  Ht 5\' 11"  (1.803 m)  Wt 225 lb (102.059 kg)  BMI 31.39 kg/m2  Body mass index is 31.39 kg/(m^2).          Review of Systems denies chest pain, dyspnea on exertion, PND, hemoptysis.     Objective:   Physical Exam BP 145/79  Pulse 78  Resp 16  Ht 5\' 11"  (1.803 m)  Wt 225 lb (102.059 kg)  BMI 31.39 kg/m2  General well-developed well-nourished male no apparent stress alert and oriented x3 Lungs are rhonchi or wheezing Right leg with moderate ecchymosis in the mid to proximal thigh with 1+ edema which is resolving. Mildly tender to palpation. No distal edema noted.   Varicosities in the calf are not nearly as tense as previously.  Today I ordered venous duplex exam of the right leg which are reviewed and interpreted. There is no DVT. The right great saphenous vein is now closed from the proximal calf to near the saphenofemoral junction.  Assessment:     Doing well one week post laser ablation right great saphenous vein Venous insufficiency right leg is now resolved following laser ablation of right great saphenous system    Plan:     Will return in 3 months this the stab phlebectomy would be indicated for residual varicosities.

## 2013-09-04 ENCOUNTER — Ambulatory Visit: Payer: Medicare Other | Admitting: Vascular Surgery

## 2013-09-04 ENCOUNTER — Encounter (HOSPITAL_COMMUNITY): Payer: Medicare Other

## 2013-12-10 ENCOUNTER — Encounter: Payer: Self-pay | Admitting: Vascular Surgery

## 2013-12-11 ENCOUNTER — Encounter: Payer: Self-pay | Admitting: Vascular Surgery

## 2013-12-11 ENCOUNTER — Ambulatory Visit (INDEPENDENT_AMBULATORY_CARE_PROVIDER_SITE_OTHER): Payer: Medicare Other | Admitting: Vascular Surgery

## 2013-12-11 VITALS — BP 130/72 | HR 57 | Ht 71.0 in | Wt 233.0 lb

## 2013-12-11 DIAGNOSIS — I83893 Varicose veins of bilateral lower extremities with other complications: Secondary | ICD-10-CM

## 2013-12-11 NOTE — Progress Notes (Signed)
Subjective:     Patient ID: Cory Parker, male   DOB: May 14, 1941, 73 y.o.   MRN: [id number]  HPI 73 year old male returns for continued followup regarding his varicose veins in the right leg. He had laser ablation right great saphenous vein performed 3 months ago for severe reflux causing painful varicosities and edema. Since then the varicosities are less prominent and continue to cause aching and throbbing discomfort as the day progresses. He also has some mild edema distally as well as an extensive pattern of reticular telangiectasias in the ankle area. He has no history of DVT or thrombophlebitis. He has tried long-leg elastic compression stockings 20-30 mm gradient without success.  Past Medical History  Diagnosis Date  . Varicose veins     History  Substance Use Topics  . Smoking status: Never Smoker   . Smokeless tobacco: Never Used  . Alcohol Use: 0.6 oz/week    1 Cans of beer per week    Family History  Problem Relation Age of Onset  . Kidney disease Mother   . Heart disease Mother   . Heart attack Father     No Known Allergies  Current outpatient prescriptions:aspirin 81 MG tablet, Take 81 mg by mouth daily.  , Disp: , Rfl: ;  calcium-vitamin D (OSCAL WITH D) 500-200 MG-UNIT per tablet, Take 1 tablet by mouth daily.  , Disp: , Rfl: ;  cholecalciferol (VITAMIN D) 1000 UNITS tablet, Take 1,000 Units by mouth daily.  , Disp: , Rfl: ;  DAILY MULTIPLE VITAMINS PO, Take by mouth daily.  , Disp: , Rfl: ;  Folic Acid 20 MG CAPS, Take by mouth daily.  , Disp: , Rfl:  HYDROcodone-homatropine (HYCODAN) 5-1.5 MG/5ML syrup, 1/2-1 teaspoon each bedtime when necessary for cough, Disp: 120 mL, Rfl: 1;  predniSONE (DELTASONE) 20 MG tablet, 2 tabs x3 days, 1 tab x3 days, a half a tab x3 days, then a half a tablet Monday Wednesday Friday for a two-week taper, Disp: 40 tablet, Rfl: 1;  pyridoxine (B-6) 100 MG tablet, Take 100 mg by mouth daily.  , Disp: , Rfl:  vitamin C (ASCORBIC ACID) 500 MG  tablet, Take 500 mg by mouth daily.  , Disp: , Rfl: ;  vitamin E 1000 UNIT capsule, Take 1,000 Units by mouth daily.  , Disp: , Rfl: ;  furosemide (LASIX) 20 MG tablet, Take 1 tablet (20 mg total) by mouth daily., Disp: 100 tablet, Rfl: 3  BP 130/72  Pulse 57  Ht 5\' 11"  (1.803 m)  Wt 233 lb (105.688 kg)  BMI 32.51 kg/m2  SpO2 95%  Body mass index is 32.51 kg/(m^2).            Review of Systems denies chest pain, dyspnea on exertion, PND, orthopnea, hemoptysis.     Objective:   Physical Exam BP 130/72  Pulse 57  Ht 5\' 11"  (1.803 m)  Wt 233 lb (105.688 kg)  BMI 32.51 kg/m2  SpO2 95%  General well-developed well-nourished male no apparent stress alert and oriented x3 Lungs rhonchi or wheezing Right lower extremity with 1+ edema and extensive telangiectasia pattern in the lower third of the leg around the medial malleolus. No active ulcer noted. Prominent bulges in the very distal thigh and medial calf of the right leg as well as posteriorly. 3 posterior cells pedis pulse palpable.     Assessment:     Status post laser ablation right great saphenous vein 3 months ago with residual painful varicosities and flange  ectasias in the ankle area which are affecting his daily living    Plan:     The patient needs multiple stab phlebectomy of right thigh and calf-10-20 and 1 course of sclerotherapy for telangiectasias and ankle. We'll proceed with precertification to perform this in the near future

## 2013-12-28 ENCOUNTER — Encounter: Payer: Self-pay | Admitting: Vascular Surgery

## 2013-12-31 ENCOUNTER — Ambulatory Visit (INDEPENDENT_AMBULATORY_CARE_PROVIDER_SITE_OTHER): Payer: Self-pay | Admitting: Vascular Surgery

## 2013-12-31 ENCOUNTER — Encounter: Payer: Self-pay | Admitting: Vascular Surgery

## 2013-12-31 VITALS — BP 170/82 | HR 65 | Resp 16 | Ht 70.0 in | Wt 225.0 lb

## 2013-12-31 DIAGNOSIS — I83893 Varicose veins of bilateral lower extremities with other complications: Secondary | ICD-10-CM

## 2013-12-31 NOTE — Progress Notes (Signed)
   Laser Ablation Procedure      Date: 12/31/2013    Draxton Luu DOB:1940-08-26  Consent signed: Yes  Surgeon:J.D. Kellie Simmering  Procedure: Stab Phlebectomies and sclerotherapy right leg  BP 170/82  Pulse 65  Resp 16  Ht 5\' 10"  (1.778 m)  Wt 225 lb (102.059 kg)  BMI 32.28 kg/m2  Start time: 3:00   End time: 4:00  Tumescent Anesthesia: 75 cc 0.9% NaCl with 50 cc Lidocaine HCL with 1% Epi and 15 cc 8.4% NaHCO3  Local Anesthesia: 3 cc Lidocaine HCL and NaHCO3 (ratio 2:1)     Sclerotherapy: .3 %Sotradecol. Patient received a total of 5 cc  Stab Phlebectomy: 10-20 Sites: Calf  Patient tolerated procedure well: Yes  Notes:   Description of Procedure:  After marking the  varicosities in the standing position, the patient was placed on the operating table in the supine position, and the right leg was prepped and draped in sterile fashion. Local anesthetic was administered  The patient was then put into Trendelenburg position.  Local anesthetic was utilized overlying the marked varicosities.  Ten to 20 stab wounds were made using the tip of an 11 blade; and using the vein hook,  The phlebectomies were performed using a hemostat to avulse these varicosities.  Adequate hemostasis was achieved, and steri strips were applied to the stab wound.    Sclerotherapy was performed on prominent spider veins around the right ankle using 5  cc .3% Sotradecol foam via a 27g butterfly needle.  ABD pads and thigh high compression stockings were applied.  Ace wrap bandages were applied over the phlebectomy sites. Blood loss was less than 15 cc.  The patient ambulated out of the operating room having tolerated the procedure well.

## 2013-12-31 NOTE — Progress Notes (Signed)
Subjective:     Patient ID: Cory Parker, male   DOB: [birth year], 73 y.o.   MRN: [id number]  HPI this 73 year old male had multiple stab phlebectomy of painful varicosities of the right leg performed under local tumescent anesthesia. He tolerated the procedure well. He also had sclerotherapy of some reticular veins in telangiectasia in the ankle area.  Review of Systems     Objective:   Physical Exam BP 170/82  Pulse 65  Resp 16  Ht 5\' 10"  (1.778 m)  Wt 225 lb (102.059 kg)  BMI 32.28 kg/m2       Assessment:     Well-tolerated stab phlebectomy secondary varicosities right leg-10-20 and sclerotherapy of telangiectasia    Plan:     Return in a week for follow followup

## 2014-01-01 ENCOUNTER — Telehealth: Payer: Self-pay | Admitting: *Deleted

## 2014-01-01 NOTE — Telephone Encounter (Signed)
Pt went to work. Spoke with wife. She reports he is doing well. No bleeding or problems.

## 2014-01-21 ENCOUNTER — Encounter: Payer: Self-pay | Admitting: Vascular Surgery

## 2014-03-18 ENCOUNTER — Encounter: Payer: Self-pay | Admitting: Vascular Surgery

## 2014-03-19 ENCOUNTER — Encounter: Payer: Self-pay | Admitting: Vascular Surgery

## 2014-03-19 ENCOUNTER — Ambulatory Visit (INDEPENDENT_AMBULATORY_CARE_PROVIDER_SITE_OTHER): Payer: Self-pay | Admitting: Vascular Surgery

## 2014-03-19 VITALS — BP 137/79 | HR 62 | Ht 70.0 in | Wt 234.8 lb

## 2014-03-19 DIAGNOSIS — I83893 Varicose veins of bilateral lower extremities with other complications: Secondary | ICD-10-CM

## 2014-03-19 NOTE — Progress Notes (Signed)
Subjective:     Patient ID: Cory Parker, male   DOB: Nov 09, 1940, 73 y.o.   MRN: [id number]  HPI this 73 year old male returns for followup regarding his multiple stab phlebectomy of painful varicosities in the right leg and sclerotherapy of a laryngectomy she is in the right ankle area. He has had some edema in the right ankle area which worsens as the day progresses. His previous venous duplex 8 weeks ago revealed no DVT with good closure of the right great saphenous vein. He also has some mild tenderness to touch in the right ankle area which has improved.   Review of Systems     Objective:   Physical Exam BP 137/79  Pulse 62  Ht 5\' 10"  (1.778 m)  Wt 234 lb 12.8 oz (106.505 kg)  BMI 33.69 kg/m2  SpO2 96%  General well-developed well-nourished male in no apparent distress alert and oriented x3 Right leg with 3+ dorsalis pedis pulse palpable. Mild thickening of skin in right medial malleolar area where sclerotherapy was performed. No ulcerations. 1+ edema lower third leg. Stab phlebectomy sites nicely healed.     Assessment:     Doing well post right great saphenous laser ablation and multiple stab phlebectomy. Has some mild tenderness which is improving from previous sclerotherapy and right ankle area.    Plan:     Have recommended elevation of legs it might and short-leg elastic compression stockings 20-30 mm gradient.  Reassured him that things should improve with time and he will return to see [place] on a when necessary basis

## 2014-03-21 ENCOUNTER — Other Ambulatory Visit (INDEPENDENT_AMBULATORY_CARE_PROVIDER_SITE_OTHER): Payer: Medicare Other

## 2014-03-21 DIAGNOSIS — I83893 Varicose veins of bilateral lower extremities with other complications: Secondary | ICD-10-CM

## 2014-03-21 DIAGNOSIS — Z0000 Encounter for general adult medical examination without abnormal findings: Secondary | ICD-10-CM

## 2014-03-21 DIAGNOSIS — Z125 Encounter for screening for malignant neoplasm of prostate: Secondary | ICD-10-CM

## 2014-03-21 DIAGNOSIS — Z136 Encounter for screening for cardiovascular disorders: Secondary | ICD-10-CM

## 2014-03-21 DIAGNOSIS — I872 Venous insufficiency (chronic) (peripheral): Secondary | ICD-10-CM

## 2014-03-21 DIAGNOSIS — I10 Essential (primary) hypertension: Secondary | ICD-10-CM

## 2014-03-21 LAB — POCT URINALYSIS DIPSTICK
Bilirubin, UA: NEGATIVE
Glucose, UA: NEGATIVE
Ketones, UA: NEGATIVE
Leukocytes, UA: NEGATIVE
Nitrite, UA: NEGATIVE
Protein, UA: NEGATIVE
Spec Grav, UA: 1.015
Urobilinogen, UA: 0.2
pH, UA: 7

## 2014-03-21 LAB — LIPID PANEL
Cholesterol: 164 mg/dL (ref 0–200)
HDL: 38.8 mg/dL — ABNORMAL LOW (ref >39.00)
LDL Cholesterol: 101 mg/dL — ABNORMAL HIGH (ref 0–99)
NonHDL: 125.2
Total CHOL/HDL Ratio: 4
Triglycerides: 119 mg/dL (ref 0.0–149.0)
VLDL: 23.8 mg/dL (ref 0.0–40.0)

## 2014-03-21 LAB — BASIC METABOLIC PANEL
BUN: 19 mg/dL (ref 6–23)
CO2: 24 mEq/L (ref 19–32)
Calcium: 9.1 mg/dL (ref 8.4–10.5)
Chloride: 105 mEq/L (ref 96–112)
Creatinine, Ser: 1.1 mg/dL (ref 0.4–1.5)
GFR: 71.58 mL/min (ref >60.00)
Glucose, Bld: 99 mg/dL (ref 70–99)
Potassium: 4.6 mEq/L (ref 3.5–5.1)
Sodium: 139 mEq/L (ref 135–145)

## 2014-03-21 LAB — CBC WITH DIFFERENTIAL/PLATELET
Basophils Absolute: 0 10*3/uL (ref 0.0–0.1)
Basophils Relative: 0.5 % (ref 0.0–3.0)
Eosinophils Absolute: 0.2 10*3/uL (ref 0.0–0.7)
Eosinophils Relative: 3.5 % (ref 0.0–5.0)
HCT: 47.5 % (ref 39.0–52.0)
Hemoglobin: 16.2 g/dL (ref 13.0–17.0)
Lymphocytes Relative: 40.7 % (ref 12.0–46.0)
Lymphs Abs: 2.5 10*3/uL (ref 0.7–4.0)
MCHC: 34.2 g/dL (ref 30.0–36.0)
MCV: 92.7 fl (ref 78.0–100.0)
Monocytes Absolute: 0.3 10*3/uL (ref 0.1–1.0)
Monocytes Relative: 5.4 % (ref 3.0–12.0)
Neutro Abs: 3.1 10*3/uL (ref 1.4–7.7)
Neutrophils Relative %: 49.9 % (ref 43.0–77.0)
Platelets: 172 10*3/uL (ref 150.0–400.0)
RBC: 5.12 Mil/uL (ref 4.22–5.81)
RDW: 12.9 % (ref 11.5–15.5)
WBC: 6.1 10*3/uL (ref 4.0–10.5)

## 2014-03-21 LAB — PSA: PSA: 3.84 ng/mL (ref 0.10–4.00)

## 2014-03-21 LAB — HEPATIC FUNCTION PANEL
ALT: 17 U/L (ref 0–53)
AST: 18 U/L (ref 0–37)
Albumin: 4.1 g/dL (ref 3.5–5.2)
Alkaline Phosphatase: 84 U/L (ref 39–117)
Bilirubin, Direct: 0.2 mg/dL (ref 0.0–0.3)
Total Bilirubin: 0.7 mg/dL (ref 0.2–1.2)
Total Protein: 6.7 g/dL (ref 6.0–8.3)

## 2014-03-21 LAB — TSH: TSH: 0.45 u[IU]/mL (ref 0.35–4.50)

## 2014-03-28 ENCOUNTER — Encounter: Payer: Self-pay | Admitting: Family Medicine

## 2014-03-28 ENCOUNTER — Ambulatory Visit (INDEPENDENT_AMBULATORY_CARE_PROVIDER_SITE_OTHER): Payer: Medicare Other | Admitting: Family Medicine

## 2014-03-28 VITALS — BP 130/80 | Temp 98.0°F | Ht 69.0 in | Wt 235.0 lb

## 2014-03-28 DIAGNOSIS — Z0000 Encounter for general adult medical examination without abnormal findings: Secondary | ICD-10-CM

## 2014-03-28 DIAGNOSIS — I83893 Varicose veins of bilateral lower extremities with other complications: Secondary | ICD-10-CM

## 2014-03-28 DIAGNOSIS — I872 Venous insufficiency (chronic) (peripheral): Secondary | ICD-10-CM

## 2014-03-28 DIAGNOSIS — Z23 Encounter for immunization: Secondary | ICD-10-CM

## 2014-03-28 MED ORDER — TRIAMTERENE-HCTZ 37.5-25 MG PO CAPS: 1.0000 | ORAL_CAPSULE | Freq: Every day | ORAL | Status: DC

## 2014-03-28 NOTE — Patient Instructions (Signed)
Dyazide 25 mg........... one tablet daily in the morning when necessary for fluid retention  Return in one year for general physical examination sooner if any problem  Walk 30 minutes daily

## 2014-03-28 NOTE — Progress Notes (Signed)
   Subjective:    Patient ID: Cory Parker, male    DOB: 19-Jan-1941, 73 y.o.   MRN: [id number]  HPI [name] is a 73 year old male who comes in today for evaluation of venous insufficiency  Weight 235 pounds he has history of chronic venous insufficiency. He was on Lasix 20 mg daily but he quit taking it for unknown reasons. He takes multiple over-the-counter vitamins advised to stop.  He gets routine eye care, dental care, colonoscopy every 10 years for screening. Vaccinations updated by Apolonio Schneiders  He had surgery on his verrucous veins by Dr. Kellie Simmering.   Review of Systems  Constitutional: Negative.   HENT: Negative.   Eyes: Negative.   Respiratory: Negative.   Cardiovascular: Negative.   Gastrointestinal: Negative.   Genitourinary: Negative.   Musculoskeletal: Negative.   Skin: Negative.   Neurological: Negative.   Psychiatric/Behavioral: Negative.        Objective:   Physical Exam  Nursing note and vitals reviewed. Constitutional: He is oriented to person, place, and time. He appears well-developed and well-nourished.  HENT:  Head: Normocephalic and atraumatic.  Right Ear: External ear normal.  Left Ear: External ear normal.  Nose: Nose normal.  Mouth/Throat: Oropharynx is clear and moist.  Eyes: Conjunctivae and EOM are normal. Pupils are equal, round, and reactive to light.  Neck: Normal range of motion. Neck supple. No JVD present. No tracheal deviation present. No thyromegaly present.  Cardiovascular: Normal rate, regular rhythm, normal heart sounds and intact distal pulses.  Exam reveals no gallop and no friction rub.   No murmur heard. Pulmonary/Chest: Effort normal and breath sounds normal. No stridor. No respiratory distress. He has no wheezes. He has no rales. He exhibits no tenderness.  Abdominal: Soft. Bowel sounds are normal. He exhibits no distension and no mass. There is no tenderness. There is no rebound and no guarding.  Genitourinary: Rectum normal,  prostate normal and penis normal. Guaiac negative stool. No penile tenderness.  Musculoskeletal: Normal range of motion. He exhibits no edema and no tenderness.  Lymphadenopathy:    He has no cervical adenopathy.  Neurological: He is alert and oriented to person, place, and time. He has normal reflexes. No cranial nerve deficit. He exhibits normal muscle tone.  Skin: Skin is warm and dry. No rash noted. No erythema. No pallor.  Psychiatric: He has a normal mood and affect. His behavior is normal. Judgment and thought content normal.   2+ symmetrical BPH       Assessment & Plan:  Healthy male  Venous insufficiency must improve since surgery.......... continue thigh-high stockings Dyazide when necessary

## 2014-03-28 NOTE — Progress Notes (Signed)
Pre visit review using our clinic review tool, if applicable. No additional management support is needed unless otherwise documented below in the visit note. 

## 2014-03-30 ENCOUNTER — Encounter: Payer: Self-pay | Admitting: Family Medicine

## 2014-10-23 ENCOUNTER — Other Ambulatory Visit: Payer: Self-pay

## 2014-10-23 DIAGNOSIS — Z9889 Other specified postprocedural states: Secondary | ICD-10-CM

## 2014-10-23 DIAGNOSIS — M79604 Pain in right leg: Secondary | ICD-10-CM

## 2014-10-23 DIAGNOSIS — Z8679 Personal history of other diseases of the circulatory system: Secondary | ICD-10-CM

## 2014-10-24 ENCOUNTER — Other Ambulatory Visit: Payer: Self-pay | Admitting: *Deleted

## 2014-10-24 ENCOUNTER — Ambulatory Visit (HOSPITAL_COMMUNITY)
Admission: RE | Admit: 2014-10-24 | Discharge: 2014-10-24 | Disposition: A | Discharge status: Home / Self Care | Payer: Medicare Other | Source: Ambulatory Visit | Attending: Vascular Surgery | Admitting: Vascular Surgery

## 2014-10-24 DIAGNOSIS — I83891 Varicose veins of right lower extremities with other complications: Secondary | ICD-10-CM | POA: Insufficient documentation

## 2014-10-28 ENCOUNTER — Encounter: Payer: Self-pay | Admitting: Vascular Surgery

## 2014-10-29 ENCOUNTER — Encounter: Payer: Self-pay | Admitting: Vascular Surgery

## 2014-10-29 ENCOUNTER — Ambulatory Visit (INDEPENDENT_AMBULATORY_CARE_PROVIDER_SITE_OTHER): Payer: Medicare Other | Admitting: Vascular Surgery

## 2014-10-29 VITALS — BP 151/75 | HR 60 | Ht 69.0 in | Wt 235.6 lb

## 2014-10-29 DIAGNOSIS — M79604 Pain in right leg: Secondary | ICD-10-CM

## 2014-10-29 NOTE — Progress Notes (Signed)
Subjective:     Patient ID: Cory Parker, male   DOB: [birth year], 74 y.o.   MRN: [id number]  HPI this 74 year old male had laser ablation in February 2015 followed by multiple stab phlebectomy is in June 2015. He states that the discomfort he was experiencing prior to the procedure has been relieved. He has no specific swelling distally. It is not elastic compression stocking. Recently he has developed some discomfort in his proximal posterior O medial thigh area which generally occurs when walking and is relieved by rest. He has no history of DVT. He has no history of back pain or sciatic nerve compression. He states it is not terrible but he was concerned about it.  Past Medical History  Diagnosis Date  . Varicose veins     History  Substance Use Topics  . Smoking status: Never Smoker   . Smokeless tobacco: Never Used  . Alcohol Use: 0.6 oz/week    1 Cans of beer per week    Family History  Problem Relation Age of Onset  . Kidney disease Mother   . Heart disease Mother   . Heart attack Father     No Known Allergies   Current outpatient prescriptions:  .  aspirin 81 MG tablet, Take 81 mg by mouth daily.  , Disp: , Rfl:  .  calcium-vitamin D (OSCAL WITH D) 500-200 MG-UNIT per tablet, Take 1 tablet by mouth daily.  , Disp: , Rfl:  .  cholecalciferol (VITAMIN D) 1000 UNITS tablet, Take 1,000 Units by mouth daily.  , Disp: , Rfl:  .  DAILY MULTIPLE VITAMINS PO, Take by mouth daily.  , Disp: , Rfl:  .  Folic Acid 20 MG CAPS, Take by mouth daily.  , Disp: , Rfl:  .  triamterene-hydrochlorothiazide (DYAZIDE) 37.5-25 MG per capsule, Take 1 each (1 capsule total) by mouth daily., Disp: 100 capsule, Rfl: 3 .  vitamin C (ASCORBIC ACID) 500 MG tablet, Take 500 mg by mouth daily.  , Disp: , Rfl:  .  vitamin E 1000 UNIT capsule, Take 1,000 Units by mouth daily.  , Disp: , Rfl:   Filed Vitals:   10/29/14 1051 10/29/14 1053  BP: 146/76 151/75  Pulse: 60   Height: 5\' 9"  (1.753 m)    Weight: 235 lb 9.6 oz (106.867 kg)   SpO2: 94%     Body mass index is 34.78 kg/(m^2).           Review of Systems denies chest pain, dyspnea on exertion, PND, orthopnea, hemoptysis, claudication     Objective:   Physical Exam BP 151/75 mmHg  Pulse 60  Ht 5\' 9"  (1.753 m)  Wt 235 lb 9.6 oz (106.867 kg)  BMI 34.78 kg/m2  SpO2 94%  Gen. well-developed well-nourished male in no apparent distress alert and oriented 3 Lungs no rhonchi or wheezing Cardiovascular regular rhythm no murmurs Right lower extremity with minimal edema distally. Some staining in the medial malleolar area from previous reticular veins with sclerotherapy. No ulcerations are noted. A few small varicosities in the medial calf area which are non-distended.  Today I ordered a venous duplex exam of the right leg which I reviewed and interpreted. There is no DVT. The right great saphenous vein is totally closed from previous ablation. The small saphenous vein has some mild reflux at the origin near the saphenous popliteal junction but otherwise is a small caliber vein. No significant deep vein reflux.     Assessment:  Right leg discomfort 14 months post laser ablation right great saphenous vein. No evidence of problems related to ablation with successful ablation and no DVT. Do not no etiology of his right thigh discomfort    Plan:     He will be seeing his medical doctor sent in for further evaluation. I told him that if the pain started radiating down the leg he may need MRI of the back or to see a neurosurgeon No evidence of vascular etiology for his leg discomfort and he was reassured regarding this

## 2014-12-03 DIAGNOSIS — H524 Presbyopia: Secondary | ICD-10-CM | POA: Diagnosis not present

## 2015-04-07 DIAGNOSIS — L570 Actinic keratosis: Secondary | ICD-10-CM | POA: Diagnosis not present

## 2015-04-07 DIAGNOSIS — X32XXXD Exposure to sunlight, subsequent encounter: Secondary | ICD-10-CM | POA: Diagnosis not present

## 2015-04-07 DIAGNOSIS — D485 Neoplasm of uncertain behavior of skin: Secondary | ICD-10-CM | POA: Diagnosis not present

## 2015-04-07 DIAGNOSIS — D225 Melanocytic nevi of trunk: Secondary | ICD-10-CM | POA: Diagnosis not present

## 2015-04-07 DIAGNOSIS — D1801 Hemangioma of skin and subcutaneous tissue: Secondary | ICD-10-CM | POA: Diagnosis not present

## 2015-04-07 DIAGNOSIS — L821 Other seborrheic keratosis: Secondary | ICD-10-CM | POA: Diagnosis not present

## 2015-05-09 ENCOUNTER — Ambulatory Visit (INDEPENDENT_AMBULATORY_CARE_PROVIDER_SITE_OTHER): Payer: Medicare Other | Admitting: *Deleted

## 2015-05-09 DIAGNOSIS — Z23 Encounter for immunization: Secondary | ICD-10-CM

## 2015-12-17 DIAGNOSIS — H524 Presbyopia: Secondary | ICD-10-CM | POA: Diagnosis not present

## 2016-04-12 ENCOUNTER — Ambulatory Visit (INDEPENDENT_AMBULATORY_CARE_PROVIDER_SITE_OTHER): Payer: Medicare Other | Admitting: Primary Care

## 2016-04-12 ENCOUNTER — Encounter: Payer: Self-pay | Admitting: Primary Care

## 2016-04-12 VITALS — BP 134/80 | HR 93 | Temp 97.9°F | Ht 69.0 in | Wt 198.8 lb

## 2016-04-12 DIAGNOSIS — I872 Venous insufficiency (chronic) (peripheral): Secondary | ICD-10-CM

## 2016-04-12 DIAGNOSIS — Z23 Encounter for immunization: Secondary | ICD-10-CM | POA: Diagnosis not present

## 2016-04-12 DIAGNOSIS — Z0000 Encounter for general adult medical examination without abnormal findings: Secondary | ICD-10-CM

## 2016-04-12 DIAGNOSIS — Z0001 Encounter for general adult medical examination with abnormal findings: Secondary | ICD-10-CM | POA: Insufficient documentation

## 2016-04-12 LAB — LIPID PANEL
Cholesterol: 145 mg/dL (ref 0–200)
HDL: 42.1 mg/dL (ref >39.00)
LDL Cholesterol: 79 mg/dL (ref 0–99)
NonHDL: 103.36
Total CHOL/HDL Ratio: 3
Triglycerides: 121 mg/dL (ref 0.0–149.0)
VLDL: 24.2 mg/dL (ref 0.0–40.0)

## 2016-04-12 LAB — COMPREHENSIVE METABOLIC PANEL
ALT: 18 U/L (ref 0–53)
AST: 20 U/L (ref 0–37)
Albumin: 3.8 g/dL (ref 3.5–5.2)
Alkaline Phosphatase: 63 U/L (ref 39–117)
BUN: 23 mg/dL (ref 6–23)
CO2: 28 mEq/L (ref 19–32)
Calcium: 9.1 mg/dL (ref 8.4–10.5)
Chloride: 106 mEq/L (ref 96–112)
Creatinine, Ser: 1.05 mg/dL (ref 0.40–1.50)
GFR: 73.52 mL/min (ref >60.00)
Glucose, Bld: 96 mg/dL (ref 70–99)
Potassium: 4.4 mEq/L (ref 3.5–5.1)
Sodium: 139 mEq/L (ref 135–145)
Total Bilirubin: 0.8 mg/dL (ref 0.2–1.2)
Total Protein: 6.3 g/dL (ref 6.0–8.3)

## 2016-04-12 LAB — HEMOGLOBIN A1C: Hgb A1c MFr Bld: 5.2 % (ref 4.6–6.5)

## 2016-04-12 NOTE — Patient Instructions (Signed)
Complete lab work prior to leaving today. I will notify you of your results once received.   You received your flu shot today. You are up to date on the rest of your vaccinations.  It's importance to improve your diet by reducing consumption of fast food, fried food, processed snack foods, sugary drinks. Increase consumption of fresh vegetables and fruits, whole grains, water.  Ensure you are drinking 64 ounces of water daily.  Start exercising. You should be getting 150 minutes of moderate intensity exercise weekly.  Follow up in 1 year for a repeat annual exam or sooner if needed.  It was a pleasure to meet you today! Please don't hesitate to call me with any questions. Welcome to [facility]!

## 2016-04-12 NOTE — Assessment & Plan Note (Signed)
Underwent outpatient saphenous vein closure in 2015 with improvement. No complaints.

## 2016-04-12 NOTE — Progress Notes (Signed)
Subjective:    Patient ID: Cory Parker, male    DOB: 08-06-1940, 75 y.o.   MRN: [id number]  HPI  Cory Parker is a 75 year old male who presents today to transfer care from [place] and for complete physical.  Immunizations: -Tetanus: Completed in 2015 -Influenza: Due, provided today. -Pneumonia: Completed Pneumovax in 2014, Prevnar in 2015 -Shingles: Completed in 2013  Diet: He endorses a fair diet. Breakfast: Cereal with fruit, toast with jelly; eggs with bacon or sausage Lunch: Raisins, sandwich, fruit bar, fruit Dinner: Restaurants, salad, meat, vegetable Snacks: Occasionally, nuts Desserts: Occasionally, cookies Beverages: Coffee, juice, water, Gatorade, sweet tea, diet soda, beer  Exercise: He does not routinely exercise. Eye exam: Completed in August 2017. Dental exam: Completes semi-annually. Colonoscopy: Completed in 2010, normal. Diverticulosis. PSA: Completed in 2015, normal.   Review of Systems  Constitutional: Negative for unexpected weight change.  HENT: Negative for rhinorrhea.   Respiratory: Negative for cough and shortness of breath.   Cardiovascular: Negative for chest pain.  Gastrointestinal: Negative for constipation and diarrhea.  Genitourinary: Negative for difficulty urinating.       Nocturia once nightly  Musculoskeletal: Negative for arthralgias and myalgias.  Skin: Negative for rash.  Allergic/Immunologic: Positive for environmental allergies.  Neurological: Negative for dizziness, numbness and headaches.  Psychiatric/Behavioral:       Denies concerns for anxiety or depression.       Past Medical History:  Diagnosis Date  . Varicose veins      Social History   Social History  . Marital status: Married    Spouse name: N/A  . Number of children: N/A  . Years of education: N/A   Occupational History  . Not on file.   Social History Main Topics  . Smoking status: Never Smoker  . Smokeless tobacco: Never Used  . Alcohol use  0.6 oz/week    1 Cans of beer per week  . Drug use: No  . Sexual activity: Not on file   Other Topics Concern  . Not on file   Social History Narrative  . No narrative on file    Past Surgical History:  Procedure Laterality Date  . APPENDECTOMY    . finger torn tendon laceration    . TONSILLECTOMY      Family History  Problem Relation Age of Onset  . Kidney disease Mother   . Heart disease Mother   . Heart attack Father     No Known Allergies  Current Outpatient Prescriptions on File Prior to Visit  Medication Sig Dispense Refill  . aspirin 81 MG tablet Take 81 mg by mouth daily.      . calcium-vitamin D (OSCAL WITH D) 500-200 MG-UNIT per tablet Take 1 tablet by mouth daily.      Marland Kitchen DAILY MULTIPLE VITAMINS PO Take by mouth daily.      . Folic Acid 20 MG CAPS Take by mouth daily.      . vitamin C (ASCORBIC ACID) 500 MG tablet Take 500 mg by mouth daily.       No current facility-administered medications on file prior to visit.     BP 134/80   Pulse 93   Temp 97.9 F (36.6 C) (Oral)   Ht 5\' 9"  (1.753 m)   Wt 198 lb 12.8 oz (90.2 kg)   SpO2 97%   BMI 29.36 kg/m    Objective:   Physical Exam  Constitutional: He is oriented to person, place, and time. He appears well-nourished.  HENT:  Right Ear: Tympanic membrane and ear canal normal.  Left Ear: Tympanic membrane and ear canal normal.  Nose: Nose normal. Right sinus exhibits no maxillary sinus tenderness and no frontal sinus tenderness. Left sinus exhibits no maxillary sinus tenderness and no frontal sinus tenderness.  Mouth/Throat: Oropharynx is clear and moist.  Eyes: Conjunctivae and EOM are normal. Pupils are equal, round, and reactive to light.  Neck: Neck supple. Carotid bruit is not present. No thyromegaly present.  Cardiovascular: Normal rate, regular rhythm and normal heart sounds.   Pulmonary/Chest: Effort normal and breath sounds normal. He has no wheezes. He has no rales.  Abdominal: Soft. Bowel  sounds are normal. There is no tenderness.  Musculoskeletal: Normal range of motion.  Neurological: He is alert and oriented to person, place, and time. He has normal reflexes. No cranial nerve deficit.  Skin: Skin is warm and dry.  Psychiatric: He has a normal mood and affect.          Assessment & Plan:

## 2016-04-12 NOTE — Addendum Note (Signed)
Addended by: Jacqualin Combes on: 04/12/2016 12:52 PM   Modules accepted: Orders

## 2016-04-12 NOTE — Assessment & Plan Note (Signed)
Immunizations UTD, influenza vaccination provided today. PSA UTD. Colonoscopy UTD. Discussed the importance of a healthy diet and regular exercise in order for weight loss, and to reduce the risk of other medical diseases. Exam unremarkable. Labs pending. Follow up in 1 year for annual exam.

## 2016-04-12 NOTE — Progress Notes (Signed)
Pre visit review using our clinic review tool, if applicable. No additional management support is needed unless otherwise documented below in the visit note. 

## 2016-04-13 ENCOUNTER — Encounter: Payer: Self-pay | Admitting: Primary Care

## 2017-04-29 ENCOUNTER — Encounter: Payer: Self-pay | Admitting: Primary Care

## 2017-04-29 ENCOUNTER — Ambulatory Visit (INDEPENDENT_AMBULATORY_CARE_PROVIDER_SITE_OTHER): Payer: Medicare Other | Admitting: Primary Care

## 2017-04-29 ENCOUNTER — Ambulatory Visit: Payer: Medicare Other

## 2017-04-29 ENCOUNTER — Encounter: Payer: Medicare Other | Admitting: Primary Care

## 2017-04-29 ENCOUNTER — Other Ambulatory Visit: Payer: Self-pay | Admitting: Primary Care

## 2017-04-29 VITALS — BP 124/76 | HR 57 | Temp 98.0°F | Ht 69.0 in | Wt 240.8 lb

## 2017-04-29 DIAGNOSIS — Z13220 Encounter for screening for lipoid disorders: Secondary | ICD-10-CM

## 2017-04-29 DIAGNOSIS — Z23 Encounter for immunization: Secondary | ICD-10-CM | POA: Diagnosis not present

## 2017-04-29 DIAGNOSIS — R350 Frequency of micturition: Secondary | ICD-10-CM

## 2017-04-29 DIAGNOSIS — Z0000 Encounter for general adult medical examination without abnormal findings: Secondary | ICD-10-CM | POA: Diagnosis not present

## 2017-04-29 DIAGNOSIS — N400 Enlarged prostate without lower urinary tract symptoms: Secondary | ICD-10-CM | POA: Insufficient documentation

## 2017-04-29 DIAGNOSIS — Z125 Encounter for screening for malignant neoplasm of prostate: Secondary | ICD-10-CM

## 2017-04-29 DIAGNOSIS — N401 Enlarged prostate with lower urinary tract symptoms: Secondary | ICD-10-CM | POA: Diagnosis not present

## 2017-04-29 DIAGNOSIS — E875 Hyperkalemia: Secondary | ICD-10-CM

## 2017-04-29 DIAGNOSIS — I872 Venous insufficiency (chronic) (peripheral): Secondary | ICD-10-CM | POA: Diagnosis not present

## 2017-04-29 LAB — COMPREHENSIVE METABOLIC PANEL
ALT: 18 U/L (ref 0–53)
AST: 18 U/L (ref 0–37)
Albumin: 4.3 g/dL (ref 3.5–5.2)
Alkaline Phosphatase: 69 U/L (ref 39–117)
BUN: 20 mg/dL (ref 6–23)
CO2: 31 mEq/L (ref 19–32)
Calcium: 9.8 mg/dL (ref 8.4–10.5)
Chloride: 105 mEq/L (ref 96–112)
Creatinine, Ser: 1.08 mg/dL (ref 0.40–1.50)
GFR: 70.97 mL/min (ref >60.00)
Glucose, Bld: 101 mg/dL — ABNORMAL HIGH (ref 70–99)
Potassium: 5.4 mEq/L — ABNORMAL HIGH (ref 3.5–5.1)
Sodium: 142 mEq/L (ref 135–145)
Total Bilirubin: 0.9 mg/dL (ref 0.2–1.2)
Total Protein: 6.6 g/dL (ref 6.0–8.3)

## 2017-04-29 LAB — PSA: PSA: 5.44 ng/mL — ABNORMAL HIGH (ref 0.10–4.00)

## 2017-04-29 LAB — LIPID PANEL
Cholesterol: 160 mg/dL (ref 0–200)
HDL: 49.4 mg/dL (ref >39.00)
LDL Cholesterol: 83 mg/dL (ref 0–99)
NonHDL: 110.65
Total CHOL/HDL Ratio: 3
Triglycerides: 136 mg/dL (ref 0.0–149.0)
VLDL: 27.2 mg/dL (ref 0.0–40.0)

## 2017-04-29 LAB — HEMOGLOBIN A1C: Hgb A1c MFr Bld: 5.3 % (ref 4.6–6.5)

## 2017-04-29 NOTE — Patient Instructions (Addendum)
Complete lab work prior to leaving today. I will notify you of your results once received.   Continue exercising. You should be getting 150 minutes of moderate intensity exercise weekly.  Increase consumption of vegetables, fruit, whole grains, lean protein.  Ensure you are consuming 64 ounces of water daily.  Ear Pressure: Try using Flonase (fluticasone) nasal spray. Instill 1 spray in each nostril twice daily.   Follow up in 1 year for your annual exam or sooner if needed.  It was a pleasure to see you today!

## 2017-04-29 NOTE — Progress Notes (Signed)
Subjective:    Patient ID: Cory Parker, male    DOB: [birth year], 76 y.o.   MRN: [id number]  HPI  Cory Parker is a 76 year old male who presents today for complete physical.  Earache to the left ear for the past one week that is worse at night. He denies fevers, cough, congestion, drainage. He's not taken anything OTC for his symptoms.   Immunizations: -Tetanus: Completed in 2015 -Influenza: Due -Pneumonia: Completed Prevnar and Pneumovax -Shingles: Completed in 2013  Diet: He endorses a fair diet. Breakfast: Cereal, fruit, toast Lunch: Rasin, peanut butter sandwich, fruit, granola bar Dinner: Pizza, chicken sandwich, salad, restaurants/fast food, beans Snacks: Sometimes, crackers Desserts: 2-3 times weekly (graham crackers, applesauce, animal crackers). Beverages: Coffee, juice, water, diet soda, sweet tea, lemonade  Exercise: He walks sometimes, does not regularly exercise. Eye exam: Completed in Summer 2018 Dental exam: Completes semi-annually Colonoscopy: Completed in 2010 PSA: Completed in 2015, due.   Review of Systems  Constitutional: Negative for fever and unexpected weight change.  HENT: Positive for ear pain. Negative for congestion and rhinorrhea.   Respiratory: Negative for cough and shortness of breath.   Cardiovascular: Negative for chest pain.  Gastrointestinal: Negative for constipation and diarrhea.  Genitourinary: Negative for difficulty urinating.       Nocturia 1-2 times nightly. Periodically will urinate every 30 minutes to 1 hour.  Musculoskeletal: Negative for myalgias.  Skin: Negative for rash.  Allergic/Immunologic: Negative for environmental allergies.  Neurological: Negative for dizziness, numbness and headaches.  Psychiatric/Behavioral:       Denies concerns for anxiety and depression       Past Medical History:  Diagnosis Date  . Varicose veins      Social History   Social History  . Marital status: Married    Spouse name: N/A   . Number of children: N/A  . Years of education: N/A   Occupational History  . Not on file.   Social History Main Topics  . Smoking status: Never Smoker  . Smokeless tobacco: Never Used  . Alcohol use 0.6 oz/week    1 Cans of beer per week  . Drug use: No  . Sexual activity: Not on file   Other Topics Concern  . Not on file   Social History Narrative  . No narrative on file    Past Surgical History:  Procedure Laterality Date  . APPENDECTOMY    . finger torn tendon laceration    . TONSILLECTOMY      Family History  Problem Relation Age of Onset  . Kidney disease Mother   . Heart disease Mother   . Heart attack Father     No Known Allergies  Current Outpatient Prescriptions on File Prior to Visit  Medication Sig Dispense Refill  . aspirin 81 MG tablet Take 81 mg by mouth daily.      . calcium-vitamin D (OSCAL WITH D) 500-200 MG-UNIT per tablet Take 1 tablet by mouth daily.      . cholecalciferol (VITAMIN D) 400 units TABS tablet Take 400 Units by mouth.    . DAILY MULTIPLE VITAMINS PO Take by mouth daily.      . Folic Acid 20 MG CAPS Take by mouth daily.      . vitamin C (ASCORBIC ACID) 500 MG tablet Take 500 mg by mouth daily.       No current facility-administered medications on file prior to visit.     BP 124/76   Pulse Marland Kitchen)  57   Temp 98 F (36.7 C) (Oral)   Ht 5\' 9"  (1.753 m)   Wt 240 lb 12.8 oz (109.2 kg)   SpO2 95%   BMI 35.56 kg/m    Objective:   Physical Exam  Constitutional: He is oriented to person, place, and time. He appears well-nourished.  HENT:  Right Ear: Tympanic membrane and ear canal normal. Tympanic membrane is not erythematous and not retracted.  Left Ear: Ear canal normal. Tympanic membrane is retracted. Tympanic membrane is not erythematous.  Nose: Nose normal. Right sinus exhibits no maxillary sinus tenderness and no frontal sinus tenderness. Left sinus exhibits no maxillary sinus tenderness and no frontal sinus tenderness.    Mouth/Throat: Oropharynx is clear and moist.  Eyes: Pupils are equal, round, and reactive to light. Conjunctivae and EOM are normal.  Neck: Neck supple. Carotid bruit is not present. No thyromegaly present.  Cardiovascular: Normal rate, regular rhythm and normal heart sounds.   Pulmonary/Chest: Effort normal and breath sounds normal. He has no wheezes. He has no rales.  Abdominal: Soft. Bowel sounds are normal. There is no tenderness.  Musculoskeletal: Normal range of motion.  Neurological: He is alert and oriented to person, place, and time. He has normal reflexes. No cranial nerve deficit.  Skin: Skin is warm and dry.  Cherry angiomas to abdomen.  Psychiatric: He has a normal mood and affect.          Assessment & Plan:

## 2017-04-29 NOTE — Assessment & Plan Note (Signed)
Recommended regular use of compression socks and elevation of extremities.

## 2017-04-29 NOTE — Assessment & Plan Note (Addendum)
Immunizations UTD, influenza vaccination provided today. PSA pending. Colonoscopy UTD. Discussed the importance of a healthy diet and regular exercise in order for weight loss, and to reduce the risk of other medical problems. Exam unremarkable except for retracted left TM, will have him use Flonase.  Labs pending. Follow up in 1 year.

## 2017-04-29 NOTE — Addendum Note (Signed)
Addended by: Jacqualin Combes on: 04/29/2017 05:04 PM   Modules accepted: Orders

## 2017-04-29 NOTE — Assessment & Plan Note (Addendum)
Experiences intermittent symptoms of dribbling, nocturia, urinary frequency. Diagnosed several years ago. PSA, A1C, CMP pending today. Declines prostate exam.  Never managed on Flomax in the past, will consider this once labs have returned.

## 2017-05-04 ENCOUNTER — Other Ambulatory Visit (INDEPENDENT_AMBULATORY_CARE_PROVIDER_SITE_OTHER): Payer: Medicare Other

## 2017-05-04 DIAGNOSIS — E875 Hyperkalemia: Secondary | ICD-10-CM

## 2017-05-04 LAB — POTASSIUM: Potassium: 4.2 mEq/L (ref 3.5–5.1)

## 2017-06-17 ENCOUNTER — Ambulatory Visit: Payer: Medicare Other | Admitting: Family Medicine

## 2017-06-17 ENCOUNTER — Encounter: Payer: Self-pay | Admitting: Family Medicine

## 2017-06-17 VITALS — BP 134/84 | HR 69 | Temp 98.0°F | Wt 235.0 lb

## 2017-06-17 DIAGNOSIS — M79605 Pain in left leg: Secondary | ICD-10-CM

## 2017-06-17 HISTORY — DX: Pain in left leg: M79.605

## 2017-06-17 MED ORDER — DICLOFENAC SODIUM 1 % TD GEL: 1.0000 "application " | Freq: Three times a day (TID) | TRANSDERMAL | 0 refills | Status: DC

## 2017-06-17 MED ORDER — NAPROXEN 375 MG PO TABS: 375.0000 mg | ORAL_TABLET | Freq: Two times a day (BID) | ORAL | 0 refills | Status: DC

## 2017-06-17 NOTE — Progress Notes (Signed)
BP 134/84 (BP Location: Left Arm, Patient Position: Sitting, Cuff Size: Normal)   Pulse 69   Temp 98 F (36.7 C) (Oral)   Wt 235 lb (106.6 kg)   SpO2 96%   BMI 34.70 kg/m    CC: L leg pain Subjective:    Patient ID: Cory Parker, male    DOB: [birth year], 76 y.o.   MRN: [id number]  HPI: Cory Parker is a 76 y.o. male presenting on 06/17/2017 for Leg Pain (from left hip down to lower leg. Shoveled snow and slipped but did not fall. Has used heat, helpful. Has taken ibuprofen and topical tx. Sleeping on left side is fine but rolling to right side causing shooting pain down left leg.)   L hip pain that started after shoveling snow and walking dog in snow. Has had a few slips in snow but without any falls. Pain starts lateral hip and travels down lateral leg to mid calf.  No back pain, fevers, chills, bladder/bowel incontinence. Denies new rash.   Yesterday especially bad.  No pain with sitting.  Worse pain when he stands and ambulates.  Ibuprofen/arnicare is helpful but only temporary. Heat has helped.  Lab Results  Component Value Date   CREATININE 1.08 04/29/2017     Relevant past medical, surgical, family and social history reviewed and updated as indicated. Interim medical history since our last visit reviewed. Allergies and medications reviewed and updated. Outpatient Medications Prior to Visit  Medication Sig Dispense Refill  . aspirin 81 MG tablet Take 81 mg by mouth daily.      . calcium-vitamin D (OSCAL WITH D) 500-200 MG-UNIT per tablet Take 1 tablet by mouth daily.      . cholecalciferol (VITAMIN D) 400 units TABS tablet Take 400 Units by mouth.    . DAILY MULTIPLE VITAMINS PO Take by mouth daily.      . Folic Acid 20 MG CAPS Take by mouth daily.      . vitamin C (ASCORBIC ACID) 500 MG tablet Take 500 mg by mouth daily.       No facility-administered medications prior to visit.      Per HPI unless specifically indicated in ROS section below Review of  Systems     Objective:    BP 134/84 (BP Location: Left Arm, Patient Position: Sitting, Cuff Size: Normal)   Pulse 69   Temp 98 F (36.7 C) (Oral)   Wt 235 lb (106.6 kg)   SpO2 96%   BMI 34.70 kg/m   Wt Readings from Last 3 Encounters:  06/17/17 235 lb (106.6 kg)  04/29/17 240 lb 12.8 oz (109.2 kg)  04/12/16 198 lb 12.8 oz (90.2 kg)    Physical Exam  Constitutional: He is oriented to person, place, and time. He appears well-developed and well-nourished. No distress.  Musculoskeletal: He exhibits no edema.  No pain midline spine No paraspinous mm tenderness Tender with SLR, rotation at hip on left.  Neg FABER. No pain at SIJ or sciatic notch bilaterally.  Painful at [place] on left as well as along ITB on left  Neurological: He is alert and oriented to person, place, and time. He has normal strength. No sensory deficit.  5/5 strength BLE  Psychiatric: He has a normal mood and affect.  Nursing note and vitals reviewed.     Assessment & Plan:   Problem List Items Addressed This Visit    Left leg pain - Primary    Anticipate L trochanteric bursitis along  with ITB syndrome from repetitive shoveling motion.  Treat with naprosyn 375mg  bid x 5 days with meals then PRN as well as topical voltaren and ice (discussed correct use). Stretching exercises for GTB and ITB syndrome provided today. Update if not improving with treatment.  Advised to separate NSAID from aspirin.           Follow up plan: Return if symptoms worsen or fail to improve.  Ria Bush, MD

## 2017-06-17 NOTE — Patient Instructions (Addendum)
I think you have hip bursitis and iliotibial band syndrome - from repetitive motion of that hip  Rest, avoid aggravating movements, may use ice to area.  Use naprosyn 375mg  twice daily with meals for 5 days then as needed. Don't take with ibuprofen. Stretching exercises provided today as well. Let us know if not improving with treatment.

## 2017-06-17 NOTE — Assessment & Plan Note (Addendum)
Anticipate L trochanteric bursitis along with ITB syndrome from repetitive shoveling motion.  Treat with naprosyn 375mg  bid x 5 days with meals then PRN as well as topical voltaren and ice (discussed correct use). Stretching exercises for GTB and ITB syndrome provided today. Update if not improving with treatment.  Advised to separate NSAID from aspirin.

## 2017-06-20 ENCOUNTER — Encounter: Payer: Self-pay | Admitting: Family Medicine

## 2017-06-20 ENCOUNTER — Ambulatory Visit: Payer: Medicare Other | Admitting: Family Medicine

## 2017-06-20 VITALS — BP 140/82 | HR 88 | Temp 98.2°F | Wt 234.2 lb

## 2017-06-20 DIAGNOSIS — M79605 Pain in left leg: Secondary | ICD-10-CM

## 2017-06-20 MED ORDER — DEXAMETHASONE SODIUM PHOSPHATE 10 MG/ML IJ SOLN
10.0000 mg | Freq: Once | INTRAMUSCULAR | Status: AC
  Administered 2017-06-20: 10 mg via INTRAMUSCULAR

## 2017-06-20 NOTE — Patient Instructions (Signed)
I think you have persistent lateral hip bursitis as well as possible sciatica on the left.  Treat with steroid shot today then continue naprosyn and topical voltaren. Continue treatment plan as up to now.  Let us know if not improving with this.

## 2017-06-20 NOTE — Progress Notes (Signed)
BP 140/82 (BP Location: Left Arm, Patient Position: Sitting, Cuff Size: Normal)   Pulse 88   Temp 98.2 F (36.8 C) (Oral)   Wt 234 lb 4 oz (106.3 kg)   SpO2 94%   BMI 34.59 kg/m    CC: persistent L leg pain Subjective:    Patient ID: Cory Parker, male    DOB: 1941/06/22, 76 y.o.   MRN: [id number]  HPI: Landis Dowdy is a 76 y.o. male presenting on 06/20/2017 for Leg Pain (follow-up. States pain is not improving even after trying recommendations from Dr. Darnell Level. Pain is disturbing sleep.)   See prior note for details. Thought combination of L bursitis and L ITB syndrome.  No significant improvement in pain despite following recommendations from last week. Insurance did not cover voltaren gel - he paid out of pocket $55.  Pain persists, now seems to be radiation to L buttock.  No fevers, rash.  Sleeps in recliner because worse pain when supine.   From last visit: L hip pain that started after shoveling snow and walking dog in snow. Has had a few slips in snow but without any falls. Pain starts lateral hip and travels down lateral leg to mid calf.  No back pain, fevers, chills, bladder/bowel incontinence. Denies new rash.  A/P: Anticipate L trochanteric bursitis along with ITB syndrome from repetitive shoveling motion. Treat with naprosyn 375mg  bid x 5 days with meals then PRN as well as topical voltaren and ice (discussed correct use). Stretching exercises for GTB and ITB syndrome provided today. Update if not improving with treatment. Advised to separate NSAID from aspirin.   Relevant past medical, surgical, family and social history reviewed and updated as indicated. Interim medical history since our last visit reviewed. Allergies and medications reviewed and updated. Outpatient Medications Prior to Visit  Medication Sig Dispense Refill  . aspirin 81 MG tablet Take 81 mg by mouth daily.      . calcium-vitamin D (OSCAL WITH D) 500-200 MG-UNIT per tablet Take 1 tablet by mouth  daily.      . cholecalciferol (VITAMIN D) 400 units TABS tablet Take 400 Units by mouth.    . DAILY MULTIPLE VITAMINS PO Take by mouth daily.      . diclofenac sodium (VOLTAREN) 1 % GEL Apply 1 application topically 3 (three) times daily. 1 Tube 0  . Folic Acid 20 MG CAPS Take by mouth daily.      . naproxen (NAPROSYN) 375 MG tablet Take 1 tablet (375 mg total) by mouth 2 (two) times daily with a meal. 30 tablet 0  . vitamin C (ASCORBIC ACID) 500 MG tablet Take 500 mg by mouth daily.       No facility-administered medications prior to visit.      Per HPI unless specifically indicated in ROS section below Review of Systems     Objective:    BP 140/82 (BP Location: Left Arm, Patient Position: Sitting, Cuff Size: Normal)   Pulse 88   Temp 98.2 F (36.8 C) (Oral)   Wt 234 lb 4 oz (106.3 kg)   SpO2 94%   BMI 34.59 kg/m   Wt Readings from Last 3 Encounters:  06/20/17 234 lb 4 oz (106.3 kg)  06/17/17 235 lb (106.6 kg)  04/29/17 240 lb 12.8 oz (109.2 kg)    Physical Exam  Constitutional: He is oriented to person, place, and time. He appears well-developed and well-nourished. No distress.  Musculoskeletal: He exhibits edema (tr).  No pain  midline spine No paraspinous mm tenderness Tender with SLR, rotation at hip on left.  Neg FABER. No pain at SIJ bilaterally.  Tender today at L sciatic notch Painful at [place] on left  Neurological: He is alert and oriented to person, place, and time. He has normal strength. No sensory deficit.  5/5 strength BLE  Psychiatric: He has a normal mood and affect.  Nursing note and vitals reviewed.     Assessment & Plan:   Problem List Items Addressed This Visit    Left leg pain - Primary    Anticipate persistent L greater trochanteric pain syndrome as well as may be developing sciatica/piriformis syndrome. Treat with dexamethasone 10mg  today IM and continue naprosyn and voltaren and treatment plan previously outlined. Update if not improved,  consider return to [facility]. Pt agrees with plan.           Follow up plan: No Follow-up on file.  Ria Bush, MD

## 2017-06-20 NOTE — Addendum Note (Signed)
Addended by: Brenton Grills on: 37/03/6437 11:06 AM   Modules accepted: Orders

## 2017-06-20 NOTE — Assessment & Plan Note (Signed)
Anticipate persistent L greater trochanteric pain syndrome as well as may be developing sciatica/piriformis syndrome. Treat with dexamethasone 10mg  today IM and continue naprosyn and voltaren and treatment plan previously outlined. Update if not improved, consider return to [facility]. Pt agrees with plan.

## 2017-06-21 ENCOUNTER — Ambulatory Visit
Admission: RE | Admit: 2017-06-21 | Discharge: 2017-06-21 | Disposition: A | Discharge status: Home / Self Care | Payer: Medicare Other | Source: Ambulatory Visit | Attending: Family Medicine | Admitting: Family Medicine

## 2017-06-21 ENCOUNTER — Ambulatory Visit: Payer: Self-pay | Admitting: *Deleted

## 2017-06-21 DIAGNOSIS — M7989 Other specified soft tissue disorders: Secondary | ICD-10-CM

## 2017-06-21 DIAGNOSIS — M79662 Pain in left lower leg: Secondary | ICD-10-CM

## 2017-06-21 DIAGNOSIS — M79605 Pain in left leg: Secondary | ICD-10-CM

## 2017-06-21 NOTE — Telephone Encounter (Signed)
Patient was in the office yesterday morning for a cortisone shot. Today he has pain and soreness in left calf. It is hard and warm and swollen. Patient is concerned about having a DVT and would like to get it checked today.  Reason for Disposition . [1] Thigh or calf pain AND [2] only 1 side AND [3] present > 1 hour  Answer Assessment - Initial Assessment Questions 1. ONSET: "When did the pain start?"      Today- it has been coming on- but worse today 2. LOCATION: "Where is the pain located?"      Left calve- swollen and hard 3. PAIN: "How bad is the pain?"    (Scale 1-10; or mild, moderate, severe)   -  MILD (1-3): doesn't interfere with normal activities    -  MODERATE (4-7): interferes with normal activities (e.g., work or school) or awakens from sleep, limping    -  SEVERE (8-10): excruciating pain, unable to do any normal activities, unable to walk     9- hurts to walk 4. WORK OR EXERCISE: "Has there been any recent work or exercise that involved this part of the body?"      Nothing out the ordinary 5. CAUSE: "What do you think is causing the leg pain?"     unsure 6. OTHER SYMPTOMS: "Do you have any other symptoms?" (e.g., chest pain, back pain, breathing difficulty, swelling, rash, fever, numbness, weakness)     Swelling, pain 7. PREGNANCY: "Is there any chance you are pregnant?" "When was your last menstrual period?"     n/a  Protocols used: LEG PAIN-A-AH

## 2017-06-21 NOTE — Telephone Encounter (Signed)
Called patient and sent him to [facility] for a Stat US of his leg

## 2017-06-21 NOTE — Telephone Encounter (Signed)
I recommend we get venous ultrasound right away today. I have ordered this.

## 2017-06-21 NOTE — Telephone Encounter (Signed)
I spoke with Dr Darnell Level who is ordering a stat US and Pt has just arrived at our office and [facility] will give info to pt. Pt is aware to stay at hospital until Dr G gets the report and pt will receive further instructions at that time.

## 2017-06-22 ENCOUNTER — Encounter: Payer: Self-pay | Admitting: Family Medicine

## 2017-06-22 ENCOUNTER — Ambulatory Visit: Payer: Medicare Other | Admitting: Family Medicine

## 2017-06-22 DIAGNOSIS — M79605 Pain in left leg: Secondary | ICD-10-CM | POA: Diagnosis not present

## 2017-06-22 MED ORDER — HYDROCODONE-ACETAMINOPHEN 5-325 MG PO TABS: 0.5000 | ORAL_TABLET | Freq: Two times a day (BID) | ORAL | 0 refills | Status: DC | PRN

## 2017-06-22 NOTE — Assessment & Plan Note (Addendum)
No calf asymmetry, recent venous US reassuring. Persistent trochanteric bursitis as well as anticipate sciatica from shoveling snow. Supportive care reviewed. Continue naprosyn, voltaren topically, ice to area, rest and exercises previously provided. Will add short narcotic course for PRN breakthrough pain mainly for rest at night time.  If not better, advised return with Dr Lorelei Pont to discuss possible bursal injection.  [place] CSRS reviewed.

## 2017-06-22 NOTE — Telephone Encounter (Addendum)
Late entry - spoke with patient about normal results of venous US - I would like him to come in Wednesday at 9:30am for recheck visit.  He will call in am to schedule this - plz call to schedule thanks.

## 2017-06-22 NOTE — Progress Notes (Signed)
BP 136/68 (BP Location: Right Arm, Patient Position: Sitting)   Pulse 65   Temp 97.6 F (36.4 C) (Oral)   Ht 5\' 9"  (1.753 m)   Wt 234 lb 8 oz (106.4 kg)   SpO2 98%   BMI 34.63 kg/m    CC: f/u L leg pain/calf swelling Subjective:    Patient ID: Cory Parker, male    DOB: Aug 03, 1940, 76 y.o.   MRN: [id number]  HPI: Cory Parker is a 76 y.o. male presenting on 06/22/2017 for Ultrasound follow up   See recent notes for details. Thought L bursitis and piriformis syndrome after shoveling snow. Treated 06/20/2017 with 10mg  dexamethasone IM. Also continues oral naprosyn/voltaren gel.   Yesterday called in concerned with L lateral calf hardness and swelling so STAT venous US obtained which was reassuringly negative for DVT. Swelling started yesterday afternoon while at work. Area never hot or red.   Ongoing fragmented sleep due to pain - does better in recliner.  He continues naprosyn as well as tylenol.   Relevant past medical, surgical, family and social history reviewed and updated as indicated. Interim medical history since our last visit reviewed. Allergies and medications reviewed and updated. Outpatient Medications Prior to Visit  Medication Sig Dispense Refill  . calcium-vitamin D (OSCAL WITH D) 500-200 MG-UNIT per tablet Take 1 tablet by mouth daily.      . cholecalciferol (VITAMIN D) 400 units TABS tablet Take 400 Units by mouth.    . DAILY MULTIPLE VITAMINS PO Take by mouth daily.      . diclofenac sodium (VOLTAREN) 1 % GEL Apply 1 application topically 3 (three) times daily. 1 Tube 0  . Folic Acid 20 MG CAPS Take by mouth daily.      . naproxen (NAPROSYN) 375 MG tablet Take 1 tablet (375 mg total) by mouth 2 (two) times daily with a meal. 30 tablet 0  . vitamin C (ASCORBIC ACID) 500 MG tablet Take 500 mg by mouth daily.      Marland Kitchen aspirin 81 MG tablet Take 81 mg by mouth daily.       No facility-administered medications prior to visit.      Per HPI unless  specifically indicated in ROS section below Review of Systems     Objective:    BP 136/68 (BP Location: Right Arm, Patient Position: Sitting)   Pulse 65   Temp 97.6 F (36.4 C) (Oral)   Ht 5\' 9"  (1.753 m)   Wt 234 lb 8 oz (106.4 kg)   SpO2 98%   BMI 34.63 kg/m   Wt Readings from Last 3 Encounters:  06/22/17 234 lb 8 oz (106.4 kg)  06/20/17 234 lb 4 oz (106.3 kg)  06/17/17 235 lb (106.6 kg)    Physical Exam  Constitutional: He appears well-developed and well-nourished. No distress.  Musculoskeletal: He exhibits no edema.  No unilateral calf swelling R calf circ 15.6 in L calf circ 15.6 in No pain midline spine + SLR on left No pain with int/ext rotation at hip. No pain at SIJ bilaterally.  + pain at left sciatic notch and left GTB   Neurological: He has normal strength. No sensory deficit. Coordination and gait normal.  Reflex Scores:      Patellar reflexes are 2+ on the right side and 2+ on the left side. 5/5 strength BLE Able to heel and toe walk  Skin: Skin is warm and dry. No rash noted. No erythema.  Nursing note and vitals reviewed.  US Venous Img Lower Unilateral Left CLINICAL DATA:  Left lower extremity swelling, redness, pain  EXAM: LEFT LOWER EXTREMITY VENOUS DOPPLER ULTRASOUND  TECHNIQUE: Gray-scale sonography with graded compression, as well as color Doppler and duplex ultrasound were performed to evaluate the lower extremity deep venous systems from the level of the common femoral vein and including the common femoral, femoral, profunda femoral, popliteal and calf veins including the posterior tibial, peroneal and gastrocnemius veins when visible. The superficial great saphenous vein was also interrogated. Spectral Doppler was utilized to evaluate flow at rest and with distal augmentation maneuvers in the common femoral, femoral and popliteal veins.  COMPARISON:  None.  FINDINGS: Contralateral Common Femoral Vein: Respiratory phasicity is  normal and symmetric with the symptomatic side. No evidence of thrombus. Normal compressibility.  Common Femoral Vein: No evidence of thrombus. Normal compressibility, respiratory phasicity and response to augmentation.  Saphenofemoral Junction: No evidence of thrombus. Normal compressibility and flow on color Doppler imaging.  Profunda Femoral Vein: No evidence of thrombus. Normal compressibility and flow on color Doppler imaging.  Femoral Vein: No evidence of thrombus. Normal compressibility, respiratory phasicity and response to augmentation.  Popliteal Vein: No evidence of thrombus. Normal compressibility, respiratory phasicity and response to augmentation.  Calf Veins: No evidence of thrombus. Normal compressibility and flow on color Doppler imaging.  Superficial Great Saphenous Vein: No evidence of thrombus. Normal compressibility.  Venous Reflux:  None.  Other Findings:  None.  IMPRESSION: No evidence of deep venous thrombosis.  Electronically Signed   By: Rolm Baptise M.D.   On: 06/21/2017 18:44      Assessment & Plan:   Problem List Items Addressed This Visit    Left leg pain    No calf asymmetry, recent venous US reassuring. Persistent trochanteric bursitis as well as anticipate sciatica from shoveling snow. Supportive care reviewed. Continue naprosyn, voltaren topically, ice to area, rest and exercises previously provided. Will add short narcotic course for PRN breakthrough pain mainly for rest at night time.  If not better, advised return with Dr Lorelei Pont to discuss possible bursal injection.  [place] CSRS reviewed.          Follow up plan: Return if symptoms worsen or fail to improve.  Ria Bush, MD

## 2017-06-22 NOTE — Patient Instructions (Addendum)
I'm glad leg ultrasound was ok.  You still have hip bursitis as well as sciatica  Continue naprosyn and voltaren gel.  We will add on hydrocodone to take 1/2-1 pill as needed, mainly at night time for rest.  If persistent lateral hip pain - schedule appointment with Dr Lorelei Pont to discuss possible bursa steroid injection.

## 2017-06-23 ENCOUNTER — Encounter: Payer: Self-pay | Admitting: Family Medicine

## 2017-06-23 ENCOUNTER — Other Ambulatory Visit: Payer: Self-pay

## 2017-06-23 ENCOUNTER — Ambulatory Visit: Payer: Medicare Other | Admitting: Family Medicine

## 2017-06-23 VITALS — BP 128/70 | HR 79 | Temp 97.6°F | Ht 69.0 in | Wt 238.5 lb

## 2017-06-23 DIAGNOSIS — M7062 Trochanteric bursitis, left hip: Secondary | ICD-10-CM

## 2017-06-23 DIAGNOSIS — M25552 Pain in left hip: Secondary | ICD-10-CM

## 2017-06-23 DIAGNOSIS — S76012A Strain of muscle, fascia and tendon of left hip, initial encounter: Secondary | ICD-10-CM | POA: Diagnosis not present

## 2017-06-23 MED ORDER — METHYLPREDNISOLONE ACETATE 40 MG/ML IJ SUSP
80.0000 mg | Freq: Once | INTRAMUSCULAR | Status: AC
  Administered 2017-06-23: 80 mg via INTRA_ARTICULAR

## 2017-06-23 NOTE — Progress Notes (Signed)
Dr. Frederico Hamman T. Golda Zavalza, MD, [facility] [place], [address] Phone: [phone] Fax: [phone]  06/23/2017  Patient: Cory Parker, MRN: [id number], DOB: [birth year], 76 y.o.  Primary Physician:  [name], NP   Chief Complaint  Patient presents with  . Hip Pain    Left   Subjective:   Cory Parker is a 76 y.o. very pleasant male patient who presents with the following:  This represents the fourth office visit for this patient since June 17, 2017.  The patient has some significant pain on the lateral aspect of his hip and to a lesser extent in the buttocks region.  He is having some pain down the lateral aspect of his leg to a lesser degree also.  His pain primarily began after he was shoveling a foot of snow repetitively during the recent snowstorm, and he also had an episode where he slipped approximately 6 inches on ice abruptly.  He is tried Naprosyn, diclofenac gel, IM Decadron, oral Vicodin, and he still has persistent pain.  He has had a DVT evaluation which was also negative on June 21, 2017.  Past Medical History, Surgical History, Social History, Family History, Problem List, Medications, and Allergies have been reviewed and updated if relevant.  Patient Active Problem List   Diagnosis Date Noted  . Left leg pain 06/17/2017  . BPH (benign prostatic hyperplasia) 04/29/2017  . Preventative health care 04/12/2016  . Right leg pain 10/29/2014  . Varicose veins of lower extremities with other complications 37/62/8315  . Venous (peripheral) insufficiency 08/30/2008    Past Medical History:  Diagnosis Date  . Varicose veins     Past Surgical History:  Procedure Laterality Date  . APPENDECTOMY    . finger torn tendon laceration    . TONSILLECTOMY      Social History   Socioeconomic History  . Marital status: Married    Spouse name: Not on file  . Number of children: Not on  file  . Years of education: Not on file  . Highest education level: Not on file  Social Needs  . Financial resource strain: Not on file  . Food insecurity - worry: Not on file  . Food insecurity - inability: Not on file  . Transportation needs - medical: Not on file  . Transportation needs - non-medical: Not on file  Occupational History  . Not on file  Tobacco Use  . Smoking status: Never Smoker  . Smokeless tobacco: Never Used  Substance and Sexual Activity  . Alcohol use: Yes    Alcohol/week: 0.6 oz    Types: 1 Cans of beer per week  . Drug use: No  . Sexual activity: Not on file  Other Topics Concern  . Not on file  Social History Narrative  . Not on file    Family History  Problem Relation Age of Onset  . Kidney disease Mother   . Heart disease Mother   . Heart attack Father     No Known Allergies  Medication list reviewed and updated in full in [facility].  GEN: No fevers, chills. Nontoxic. Primarily MSK c/o today. MSK: Detailed in the HPI GI: tolerating PO intake without difficulty Neuro: No numbness, parasthesias, or tingling associated. Otherwise the pertinent positives of the ROS are noted above.   Objective:   BP 128/70   Pulse 79   Temp 97.6 F (36.4 C) (Oral)   Ht  5\' 9"  (1.753 m)   Wt 238 lb 8 oz (108.2 kg)   BMI 35.22 kg/m    GEN: WDWN, NAD, Non-toxic, Alert & Oriented x 3 HEENT: Atraumatic, Normocephalic.  Ears and Nose: No external deformity. EXTR: No clubbing/cyanosis/edema NEURO: Normal gait. Mild antalgia PSYCH: Normally interactive. Conversant. Not depressed or anxious appearing.  Calm demeanor.   HIP EXAM: SIDE: L ROM: Abduction, Flexion, Internal and External range of motion: Modest restriction of motion in all directions, there is some mild pain with terminal external range of motion and to a lesser extent internal range of motion. GTB:  tender to palpation. There is some additional tenderness proximal to the greater  trochanter, but there is no significant tenderness along the posterior pelvis. SLR: NEG Knees: No effusion FABER: some pain Piriformis: NT at direct palpation Str: flexion: 5/5 abduction: 5/5 adduction: 5/5 Strength testing non-tender     Radiology: US Venous Img Lower Unilateral Left  Result Date: 06/21/2017 CLINICAL DATA:  Left lower extremity swelling, redness, pain EXAM: LEFT LOWER EXTREMITY VENOUS DOPPLER ULTRASOUND TECHNIQUE: Gray-scale sonography with graded compression, as well as color Doppler and duplex ultrasound were performed to evaluate the lower extremity deep venous systems from the level of the common femoral vein and including the common femoral, femoral, profunda femoral, popliteal and calf veins including the posterior tibial, peroneal and gastrocnemius veins when visible. The superficial great saphenous vein was also interrogated. Spectral Doppler was utilized to evaluate flow at rest and with distal augmentation maneuvers in the common femoral, femoral and popliteal veins. COMPARISON:  None. FINDINGS: Contralateral Common Femoral Vein: Respiratory phasicity is normal and symmetric with the symptomatic side. No evidence of thrombus. Normal compressibility. Common Femoral Vein: No evidence of thrombus. Normal compressibility, respiratory phasicity and response to augmentation. Saphenofemoral Junction: No evidence of thrombus. Normal compressibility and flow on color Doppler imaging. Profunda Femoral Vein: No evidence of thrombus. Normal compressibility and flow on color Doppler imaging. Femoral Vein: No evidence of thrombus. Normal compressibility, respiratory phasicity and response to augmentation. Popliteal Vein: No evidence of thrombus. Normal compressibility, respiratory phasicity and response to augmentation. Calf Veins: No evidence of thrombus. Normal compressibility and flow on color Doppler imaging. Superficial Great Saphenous Vein: No evidence of thrombus. Normal  compressibility. Venous Reflux:  None. Other Findings:  None. IMPRESSION: No evidence of deep venous thrombosis. Electronically Signed   By: Rolm Baptise M.D.   On: 06/21/2017 18:44    Assessment and Plan:   Trochanteric bursitis of left hip  Left hip pain - Plan: methylPREDNISolone acetate (DEPO-MEDROL) injection 80 mg  Sprain, gluteus medius, left, initial encounter  The patient has not done all that well after acute injury.  Given history and exam, consistent with acute injury while shoveling snow versus abrupt movement on the ice.  Suspect insertional gluteus medius injury at the caudal aspect of the greater trochanter, with additional trochanteric bursitis.  He also has some mild pain along his iliotibial band and tensor fascia lata.  There is no significant pain at [facility] tubercle.  His strength is quite good, and I suspect he will do well if he gets better pain control, and I recommended that he continue with some basic motion, strengthening, and walking forwards, backwards, and sideways.  Trochanteric Bursitis Injection, L Verbal consent obtained. Risks (including infection, potential atrophy), benefits, and alternatives reviewed. Greater trochanter sterilely prepped with Chloraprep. Ethyl Chloride used for anesthesia. 8 cc of Lidocaine 1% injected with 2 mL of Depo-Medrol 40 mg and 50% of  medication injected into trochanteric bursa at area of maximal tenderness at greater trochanter the remaining 50% of medication was taken superiorly to the edge of the superior aspect of the trochanter and the femur as the femoral neck goes to the true hip. Needle taken to bone, flows easily. Bursa massaged. No bleeding and no complications. Decreased pain after injection. Needle: 22 gauge spinal needle   Follow-up: No Follow-up on file.  Meds ordered this encounter  Medications  . methylPREDNISolone acetate (DEPO-MEDROL) injection 80 mg   Signed,  [place] T. Trinika Cortese, MD   Allergies as of  06/23/2017   No Known Allergies     Medication List        Accurate as of 06/23/17 11:59 PM. Always use your most recent med list.          aspirin 81 MG tablet Take 81 mg by mouth daily.   calcium-vitamin D 500-200 MG-UNIT tablet Commonly known as:  OSCAL WITH D Take 1 tablet by mouth daily.   cholecalciferol 400 units Tabs tablet Commonly known as:  VITAMIN D Take 400 Units by mouth.   DAILY MULTIPLE VITAMINS PO Take by mouth daily.   diclofenac sodium 1 % Gel Commonly known as:  VOLTAREN Apply 1 application topically 3 (three) times daily.   Folic Acid 20 MG Caps Take by mouth daily.   HYDROcodone-acetaminophen 5-325 MG tablet Commonly known as:  NORCO/VICODIN Take 0.5-1 tablets by mouth 2 (two) times daily as needed (breakthrough pain).   naproxen 375 MG tablet Commonly known as:  NAPROSYN Take 1 tablet (375 mg total) by mouth 2 (two) times daily with a meal.   vitamin C 500 MG tablet Commonly known as:  ASCORBIC ACID Take 500 mg by mouth daily.

## 2017-07-12 ENCOUNTER — Ambulatory Visit: Payer: Medicare Other | Admitting: Family Medicine

## 2017-07-12 ENCOUNTER — Encounter: Payer: Self-pay | Admitting: Family Medicine

## 2017-07-12 VITALS — BP 120/80 | HR 66 | Temp 98.0°F | Wt 232.0 lb

## 2017-07-12 DIAGNOSIS — M79605 Pain in left leg: Secondary | ICD-10-CM | POA: Diagnosis not present

## 2017-07-12 NOTE — Patient Instructions (Addendum)
You are doing well today Continue 2 tylenol at night as needed. Start doing lateral leg raises to help strengthen hip muscles.  Continue heating pad.  You can take aleve 220mg  1 tablet daily to twice daily with meals for next 1-2 weeks.

## 2017-07-12 NOTE — Assessment & Plan Note (Addendum)
Ongoing but improving. Reassured on progress to date. Exam consistent with possible piriformis syndrome in addition to known recent GTBursitis. Discussed continued tylenol, voltaren as up to now, may add aleve 220mg  BID PRN pain for next 1-2 weeks. Continue heating pad as well. Discussed lateral leg raises to help strengthen hip abductors. Update with effect.

## 2017-07-12 NOTE — Progress Notes (Signed)
BP 120/80 (BP Location: Left Arm, Patient Position: Sitting, Cuff Size: Normal)   Pulse 66   Temp 98 F (36.7 C) (Oral)   Wt 232 lb (105.2 kg)   SpO2 97%   BMI 34.26 kg/m    CC: f/u L hip bursitis Subjective:    Patient ID: Cory Parker, male    DOB: 04-01-1941, 77 y.o.   MRN: [id number]  HPI: Cory Parker is a 77 y.o. male presenting on 07/12/2017 for Follow-up (Here for f/u on IT band in left LE. Still having some pain, improving. Was able to sleep all night last night. Still taking an occasional Aleve or Tylenol.)   Seen here 4 times last month with L leg/hip pain, latest eval by Dr Lorelei Pont with hip bursitis and L gluteus medius sprain s/p bursal steroid injection with improvement. He also had pain along ITB and tensor fascia lata. Note reviewed.  Steroid shot did help. "I just wanted to follow up" - finally slept the night through last night - first time. But still notices lateral hip pain and buttock pain and calf pain on right. main concern is ongoing lower leg dull discomfort at calf as well as some tingling/numbness of left foot. Doing home exercises. No significant lower back pain. Continues taking 2 tylenol at bedtime. He continues heating pad as well.   Relevant past medical, surgical, family and social history reviewed and updated as indicated. Interim medical history since our last visit reviewed. Allergies and medications reviewed and updated. Outpatient Medications Prior to Visit  Medication Sig Dispense Refill  . aspirin 81 MG tablet Take 81 mg by mouth daily.      . calcium-vitamin D (OSCAL WITH D) 500-200 MG-UNIT per tablet Take 1 tablet by mouth daily.      . cholecalciferol (VITAMIN D) 400 units TABS tablet Take 400 Units by mouth.    . DAILY MULTIPLE VITAMINS PO Take by mouth daily.      . diclofenac sodium (VOLTAREN) 1 % GEL Apply 1 application topically 3 (three) times daily. 1 Tube 0  . Folic Acid 20 MG CAPS Take by mouth daily.      . vitamin C  (ASCORBIC ACID) 500 MG tablet Take 500 mg by mouth daily.      Marland Kitchen HYDROcodone-acetaminophen (NORCO/VICODIN) 5-325 MG tablet Take 0.5-1 tablets by mouth 2 (two) times daily as needed (breakthrough pain). 10 tablet 0  . naproxen (NAPROSYN) 375 MG tablet Take 1 tablet (375 mg total) by mouth 2 (two) times daily with a meal. 30 tablet 0   No facility-administered medications prior to visit.      Per HPI unless specifically indicated in ROS section below Review of Systems     Objective:    BP 120/80 (BP Location: Left Arm, Patient Position: Sitting, Cuff Size: Normal)   Pulse 66   Temp 98 F (36.7 C) (Oral)   Wt 232 lb (105.2 kg)   SpO2 97%   BMI 34.26 kg/m   Wt Readings from Last 3 Encounters:  07/12/17 232 lb (105.2 kg)  06/23/17 238 lb 8 oz (108.2 kg)  06/22/17 234 lb 8 oz (106.4 kg)    Physical Exam  Constitutional: He is oriented to person, place, and time. He appears well-developed and well-nourished. No distress.  Musculoskeletal: He exhibits edema (tr pedal).  Neg seated SLR on left No pain with int/ext rotation at hip. Tender at sciatic notch on left as well as GTB on left.   Neurological: He  is alert and oriented to person, place, and time.  5/5 strength BLE Able to heel and toe walk without difficulty  Skin: Skin is warm and dry. No rash noted.  Psychiatric: He has a normal mood and affect.  Nursing note and vitals reviewed.  Results for orders placed or performed in visit on 05/04/17  Potassium  Result Value Ref Range   Potassium 4.2 3.5 - 5.1 mEq/L   Lab Results  Component Value Date   CREATININE 1.08 04/29/2017       Assessment & Plan:   Problem List Items Addressed This Visit    Left leg pain - Primary    Ongoing but improving. Reassured on progress to date. Exam consistent with possible piriformis syndrome in addition to known recent GTBursitis. Discussed continued tylenol, voltaren as up to now, may add aleve 220mg  BID PRN pain for next 1-2 weeks.  Continue heating pad as well. Discussed lateral leg raises to help strengthen hip abductors. Update with effect.           Follow up plan: Return if symptoms worsen or fail to improve.  Ria Bush, MD

## 2017-09-16 ENCOUNTER — Encounter: Payer: Self-pay | Admitting: Family Medicine

## 2017-09-16 ENCOUNTER — Ambulatory Visit: Payer: Medicare Other | Admitting: Family Medicine

## 2017-09-16 ENCOUNTER — Ambulatory Visit: Payer: Self-pay | Admitting: *Deleted

## 2017-09-16 VITALS — BP 120/64 | HR 63 | Temp 97.8°F | Wt 236.0 lb

## 2017-09-16 DIAGNOSIS — I872 Venous insufficiency (chronic) (peripheral): Secondary | ICD-10-CM

## 2017-09-16 NOTE — Patient Instructions (Signed)
I do think your leg swelling is coming from chronic venous insufficiency.  Elevate legs when able, avoid salt and sodium in the diet, and drink plenty of water.  Start using compression stockings.   Chronic Venous Insufficiency Chronic venous insufficiency, also called venous stasis, is a condition that prevents blood from being pumped effectively through the veins in your legs. Blood may no longer be pumped effectively from the legs back to the heart. This condition can range from mild to severe. With proper treatment, you should be able to continue with an active life. What are the causes? Chronic venous insufficiency occurs when the vein walls become stretched, weakened, or damaged, or when valves within the vein are damaged. Some common causes of this include:  High blood pressure inside the veins (venous hypertension).  Increased blood pressure in the leg veins from long periods of sitting or standing.  A blood clot that blocks blood flow in a vein (deep vein thrombosis, DVT).  Inflammation of a vein (phlebitis) that causes a blood clot to form.  Tumors in the pelvis that cause blood to back up.  What increases the risk? The following factors may make you more likely to develop this condition:  Having a family history of this condition.  Obesity.  Pregnancy.  Living without enough physical activity or exercise (sedentary lifestyle).  Smoking.  Having a job that requires long periods of standing or sitting in one place.  Being a certain age. Women in their 72s and 22s and men in their 71s are more likely to develop this condition.  What are the signs or symptoms? Symptoms of this condition include:  Veins that are enlarged, bulging, or twisted (varicose veins).  Skin breakdown or ulcers.  Reddened or discolored skin on the front of the leg.  Brown, smooth, tight, and painful skin just above the ankle, usually on the inside of the leg  (lipodermatosclerosis).  Swelling.  How is this diagnosed? This condition may be diagnosed based on:  Your medical history.  A physical exam.  Tests, such as: ? A procedure that creates an image of a blood vessel and nearby organs and provides information about blood flow through the blood vessel (duplex ultrasound). ? A procedure that tests blood flow (plethysmography). ? A procedure to look at the veins using X-ray and dye (venogram).  How is this treated? The goals of treatment are to help you return to an active life and to minimize pain or disability. Treatment depends on the severity of your condition, and it may include:  Wearing compression stockings. These can help relieve symptoms and help prevent your condition from getting worse. However, they do not cure the condition.  Sclerotherapy. This is a procedure involving an injection of a material that "dissolves" damaged veins.  Surgery. This may involve: ? Removing a diseased vein (vein stripping). ? Cutting off blood flow through the vein (laser ablation surgery). ? Repairing a valve.  Follow these instructions at home:  Wear compression stockings as told by your health care provider. These stockings help to prevent blood clots and reduce swelling in your legs.  Take over-the-counter and prescription medicines only as told by your health care provider.  Stay active by exercising, walking, or doing different activities. Ask your health care provider what activities are safe for you and how much exercise you need.  Drink enough fluid to keep your urine clear or pale yellow.  Do not use any products that contain nicotine or tobacco, such as  cigarettes and e-cigarettes. If you need help quitting, ask your health care provider.  Keep all follow-up visits as told by your health care provider. This is important. Contact a health care provider if:  You have redness, swelling, or more pain in the affected area.  You see a  red streak or line that extends up or down from the affected area.  You have skin breakdown or a loss of skin in the affected area, even if the breakdown is small.  You get an injury in the affected area. Get help right away if:  You get an injury and an open wound in the affected area.  You have severe pain that does not get better with medicine.  You have sudden numbness or weakness in the foot or ankle below the affected area, or you have trouble moving your foot or ankle.  You have a fever and you have worse or persistent symptoms.  You have chest pain.  You have shortness of breath. Summary  Chronic venous insufficiency, also called venous stasis, is a condition that prevents blood from being pumped effectively through the veins in your legs.  Chronic venous insufficiency occurs when the vein walls become stretched, weakened, or damaged, or when valves within the vein are damaged.  Treatment for this condition depends on how severe your condition is, and it may involve wearing compression stockings or having a procedure.  Make sure you stay active by exercising, walking, or doing different activities. Ask your health care provider what activities are safe for you and how much exercise you need. This information is not intended to replace advice given to you by your health care provider. Make sure you discuss any questions you have with your health care provider. Document Released: 10/25/2006 Document Revised: 05/10/2016 Document Reviewed: 05/10/2016 Elsevier Interactive Patient Education  2017 [employer].

## 2017-09-16 NOTE — Telephone Encounter (Signed)
   Answer Assessment - Initial Assessment Questions 1. ONSET: "When did the swelling start?" (e.g., minutes, hours, days)     About a month ago 2. LOCATION: "What part of the leg is swollen?"  "Are both legs swollen or just one leg?"     Left knee down 3. SEVERITY: "How bad is the swelling?" (e.g., localized; mild, moderate, severe)  - Localized - small area of swelling localized to one leg  - MILD pedal edema - swelling limited to foot and ankle, pitting edema < 1/4 inch (6 mm) deep, rest and elevation eliminate most or all swelling  - MODERATE edema - swelling of lower leg to knee, pitting edema > 1/4 inch (6 mm) deep, rest and elevation only partially reduce swelling  - SEVERE edema - swelling extends above knee, facial or hand swelling present      mild 4. REDNESS: "Does the swelling look red or infected?"     no 5. PAIN: "Is the swelling painful to touch?" If so, ask: "How painful is it?"   (Scale 1-10; mild, moderate or severe)     No pain 6. FEVER: "Do you have a fever?" If so, ask: "What is it, how was it measured, and when did it start?"      no 7. CAUSE: "What do you think is causing the leg swelling?"     Not sure 8. MEDICAL HISTORY: "Do you have a history of heart failure, kidney disease, liver failure, or cancer?"     no 9. RECURRENT SYMPTOM: "Have you had leg swelling before?" If so, ask: "When was the last time?" "What happened that time?"     No swelling 10. OTHER SYMPTOMS: "Do you have any other symptoms?" (e.g., chest pain, difficulty breathing)       no 11. PREGNANCY: "Is there any chance you are pregnant?" "When was your last menstrual period?"       no  Protocols used: LEG SWELLING AND EDEMA-A-AH

## 2017-09-16 NOTE — Progress Notes (Signed)
BP 120/64 (BP Location: Left Arm, Patient Position: Sitting, Cuff Size: Normal)   Pulse 63   Temp 97.8 F (36.6 C) (Oral)   Wt 236 lb (107 kg)   SpO2 94%   BMI 34.85 kg/m    CC: leg swelling Subjective:    Patient ID: Cory Parker, male    DOB: 1941/02/05, 77 y.o.   MRN: [id number]  HPI: Cory Parker is a 77 y.o. male presenting on 09/16/2017 for Leg Swelling (Left lower LE swelling down to the foot. Has had off and on after IT band.  Contacted vein/vascular this morning but has not heard back. )   See prior notes for details. Dx 06/2017 L hip bursitis, L gluteus medius sprain s/p bursal steroid injection with improvement. Had prolonged recovery course - took 8 wks to fully resolve. He is walking regularly and doing lateral leg raises.   Ongoing trouble with swelling of L calf and foot. Worse as day progresses, better when he first wakes up. He may go see vascular doc for this. S/p laser ablation R great saphenous vein.  He did have negative venous US 06/2017.  He is not regularly using compression stockings.   Relevant past medical, surgical, family and social history reviewed and updated as indicated. Interim medical history since our last visit reviewed. Allergies and medications reviewed and updated. Outpatient Medications Prior to Visit  Medication Sig Dispense Refill  . aspirin 81 MG tablet Take 81 mg by mouth daily.      . calcium-vitamin D (OSCAL WITH D) 500-200 MG-UNIT per tablet Take 1 tablet by mouth daily.      . cholecalciferol (VITAMIN D) 400 units TABS tablet Take 400 Units by mouth.    . DAILY MULTIPLE VITAMINS PO Take by mouth daily.      . Folic Acid 20 MG CAPS Take by mouth daily.      . vitamin C (ASCORBIC ACID) 500 MG tablet Take 500 mg by mouth daily.      . diclofenac sodium (VOLTAREN) 1 % GEL Apply 1 application topically 3 (three) times daily. 1 Tube 0   No facility-administered medications prior to visit.      Per HPI unless specifically  indicated in ROS section below Review of Systems     Objective:    BP 120/64 (BP Location: Left Arm, Patient Position: Sitting, Cuff Size: Normal)   Pulse 63   Temp 97.8 F (36.6 C) (Oral)   Wt 236 lb (107 kg)   SpO2 94%   BMI 34.85 kg/m   Wt Readings from Last 3 Encounters:  09/16/17 236 lb (107 kg)  07/12/17 232 lb (105.2 kg)  06/23/17 238 lb 8 oz (108.2 kg)    Physical Exam  Constitutional: He appears well-developed and well-nourished. No distress.  Musculoskeletal: He exhibits edema (1+ pitting bilaterally).  R calf circ 40cm L calf circ 41cm 2+ DP  Skin: Skin is warm and dry. No rash noted. No erythema.  Nursing note and vitals reviewed.  Results for orders placed or performed in visit on 05/04/17  Potassium  Result Value Ref Range   Potassium 4.2 3.5 - 5.1 mEq/L      Assessment & Plan:   Problem List Items Addressed This Visit    Venous (peripheral) insufficiency - Primary    I think this is CVI related - recommended he start using compression stockings regularly. Reviewed further supportive care.           No orders  of the defined types were placed in this encounter.  No orders of the defined types were placed in this encounter.   Follow up plan: Return if symptoms worsen or fail to improve.  Ria Bush, MD

## 2017-09-16 NOTE — Assessment & Plan Note (Signed)
I think this is CVI related - recommended he start using compression stockings regularly. Reviewed further supportive care.

## 2017-09-16 NOTE — Telephone Encounter (Signed)
Pt has appt with Dr Darnell Level 09/16/17 at 2:45.

## 2017-09-16 NOTE — Telephone Encounter (Signed)
Pt states the swelling in his left leg has gotten worse and it extends from the knee down.Marland Kitchen He stated that he had worn an IT band for his leg. He wanted to see his pcp before going back to the vein and vascular specialist.  Appointment made per protocol.   Reason for Disposition . [1] MODERATE leg swelling (e.g., swelling extends up to knees) AND [2] new onset or worsening  Additional Information . Commented on: Answer Assessment - Initial Assessment Questions    His swelling to his lower leg is moderate. It extends to the knee. Disregard # 3 for severity under the assessment questions.  Protocols used: LEG SWELLING AND EDEMA-A-AH

## 2017-10-04 ENCOUNTER — Other Ambulatory Visit: Payer: Self-pay

## 2017-10-04 DIAGNOSIS — I872 Venous insufficiency (chronic) (peripheral): Secondary | ICD-10-CM

## 2017-10-17 ENCOUNTER — Ambulatory Visit (HOSPITAL_COMMUNITY)
Admission: RE | Admit: 2017-10-17 | Discharge: 2017-10-17 | Disposition: A | Discharge status: Home / Self Care | Payer: Medicare Other | Source: Ambulatory Visit | Attending: Surgery | Admitting: Surgery

## 2017-10-17 DIAGNOSIS — I872 Venous insufficiency (chronic) (peripheral): Secondary | ICD-10-CM | POA: Diagnosis not present

## 2017-10-19 ENCOUNTER — Encounter: Payer: Self-pay | Admitting: Vascular Surgery

## 2017-10-19 ENCOUNTER — Other Ambulatory Visit: Payer: Self-pay

## 2017-10-19 ENCOUNTER — Ambulatory Visit: Payer: Medicare Other | Admitting: Vascular Surgery

## 2017-10-19 VITALS — BP 137/76 | HR 62 | Temp 97.9°F | Resp 20 | Ht 69.0 in | Wt 236.0 lb

## 2017-10-19 DIAGNOSIS — I872 Venous insufficiency (chronic) (peripheral): Secondary | ICD-10-CM

## 2017-10-19 NOTE — Progress Notes (Signed)
Patient name: Cory Parker MRN: [id number] DOB: 13-Sep-1940 Sex: male   REASON FOR CONSULT:    Left lower extremity swelling with possible venous insufficiency.  HPI:   Cory Parker is a pleasant 77 y.o. male, who was last seen in our office by Dr. Kellie Simmering on 10/29/2014.  The patient had undergone laser ablation with multiple stab phlebectomies in the right lower extremity in 2015.  He was having some right leg at the time of his last visit and  his venous duplex scan showed no evidence of DVT.  While shoveling snow in December he began having left leg pain.  He was ultimately evaluated and felt to have iliotibial band syndrome.  He also had some hip arthritis and had injections in his hips.  This pain ultimately resolved however he is continued to have some left leg swelling.  This began in December.  He did have a venous duplex scan on 06/21/2017 which showed no evidence of DVT in the left lower extremity.  He experiences some aching pain in the left leg associated with the swelling.  The swelling is relieved with elevation.  He does not wear compression stockings consistently.  He does he use ibuprofen some for pain which helps.  He denies any previous history of DVT.  I do not get any history of claudication, rest pain, or nonhealing ulcers.    Past Medical History:  Diagnosis Date  . Varicose veins     Family History  Problem Relation Age of Onset  . Kidney disease Mother   . Heart disease Mother   . Heart attack Father     SOCIAL HISTORY: Social History   Socioeconomic History  . Marital status: Married    Spouse name: Not on file  . Number of children: Not on file  . Years of education: Not on file  . Highest education level: Not on file  Occupational History  . Not on file  Social Needs  . Financial resource strain: Not on file  . Food insecurity:    Worry: Not on file    Inability: Not on file  . Transportation needs:    Medical: Not on file   Non-medical: Not on file  Tobacco Use  . Smoking status: Never Smoker  . Smokeless tobacco: Never Used  Substance and Sexual Activity  . Alcohol use: Yes    Alcohol/week: 0.6 oz    Types: 1 Cans of beer per week  . Drug use: No  . Sexual activity: Not on file  Lifestyle  . Physical activity:    Days per week: Not on file    Minutes per session: Not on file  . Stress: Not on file  Relationships  . Social connections:    Talks on phone: Not on file    Gets together: Not on file    Attends religious service: Not on file    Active member of club or organization: Not on file    Attends meetings of clubs or organizations: Not on file    Relationship status: Not on file  . Intimate partner violence:    Fear of current or ex partner: Not on file    Emotionally abused: Not on file    Physically abused: Not on file    Forced sexual activity: Not on file  Other Topics Concern  . Not on file  Social History Narrative  . Not on file    No Known Allergies  Current Outpatient Medications  Medication Sig Dispense Refill  . aspirin 81 MG tablet Take 81 mg by mouth daily.      . calcium-vitamin D (OSCAL WITH D) 500-200 MG-UNIT per tablet Take 1 tablet by mouth daily.      . cholecalciferol (VITAMIN D) 400 units TABS tablet Take 400 Units by mouth.    . DAILY MULTIPLE VITAMINS PO Take by mouth daily.      . Folic Acid 20 MG CAPS Take by mouth daily.      . vitamin C (ASCORBIC ACID) 500 MG tablet Take 500 mg by mouth daily.       No current facility-administered medications for this visit.     REVIEW OF SYSTEMS:  [X]  denotes positive finding, [ ]  denotes negative finding Cardiac  Comments:  Chest pain or chest pressure:    Shortness of breath upon exertion:    Short of breath when lying flat:    Irregular heart rhythm:        Vascular    Pain in calf, thigh, or hip brought on by ambulation:    Pain in feet at night that wakes you up from your sleep:     Blood clot in your  veins:    Leg swelling:  x       Pulmonary    Oxygen at home:    Productive cough:     Wheezing:         Neurologic    Sudden weakness in arms or legs:     Sudden numbness in arms or legs:     Sudden onset of difficulty speaking or slurred speech:    Temporary loss of vision in one eye:     Problems with dizziness:         Gastrointestinal    Blood in stool:     Vomited blood:         Genitourinary    Burning when urinating:     Blood in urine:        Psychiatric    Major depression:         Hematologic    Bleeding problems:    Problems with blood clotting too easily:        Skin    Rashes or ulcers:        Constitutional    Fever or chills:     PHYSICAL EXAM:   Vitals:   10/19/17 0914  BP: 137/76  Pulse: 62  Resp: 20  Temp: 97.9 F (36.6 C)  TempSrc: Oral  SpO2: 93%  Weight: 236 lb (107 kg)  Height: 5\' 9"  (1.753 m)    GENERAL: The patient is a well-nourished male, in no acute distress. The vital signs are documented above. CARDIAC: There is a regular rate and rhythm.  VASCULAR: I do not detect carotid bruits. He has palpable femoral, popliteal, and dorsalis pedis pulses bilaterally. He has mild left lower extremity swelling. He has hyperpigmentation bilaterally consistent with chronic venous insufficiency. PULMONARY: There is good air exchange bilaterally without wheezing or rales. ABDOMEN: Soft and non-tender with normal pitched bowel sounds.  MUSCULOSKELETAL: There are no major deformities or cyanosis. NEUROLOGIC: No focal weakness or paresthesias are detected. SKIN: There are no ulcers or rashes noted. PSYCHIATRIC: The patient has a normal affect.  DATA:    VENOUS DUPLEX: I reviewed the venous duplex scan that was done on 06/21/2017.  This was of the left lower extremity because he was having pain and redness.  This showed no  evidence of DVT in the left lower extremity.  MEDICAL ISSUES:   CHRONIC VENOUS INSUFFICIENCY: Based on his exam and  history he has evidence of chronic venous insufficiency bilaterally.  He has more swelling in the left leg likely related to the fact that he has had his superficial venous reflux addressed on the right previously in 2015.  I have discussed with him the importance of intermittent leg elevation the proper positioning for this.  I have written him a prescription for knee-high compression stockings with a gradient of 15-20 mmHg.  I have encouraged him to avoid prolonged sitting and standing.  I encouraged him to exercise as much as possible.  We also discussed water aerobics which is very helpful for patients with venous disease.  If his swelling worsens and certainly I be happy to see him back and get a formal reflux study of the left lower extremity to look for evidence of significant chronic venous insufficiency.  We could try him in a 20-30 thigh-high stocking if the knee-high stockings were not helpful.  He will call if his symptoms worsen.  Deitra Mayo [facility] [phone]

## 2018-01-20 ENCOUNTER — Encounter: Payer: Self-pay | Admitting: Family Medicine

## 2018-01-20 ENCOUNTER — Ambulatory Visit: Payer: Medicare Other | Admitting: Family Medicine

## 2018-01-20 VITALS — BP 114/58 | HR 81 | Temp 98.7°F | Ht 69.0 in | Wt 231.0 lb

## 2018-01-20 DIAGNOSIS — J22 Unspecified acute lower respiratory infection: Secondary | ICD-10-CM

## 2018-01-20 MED ORDER — GUAIFENESIN-CODEINE 100-10 MG/5ML PO SYRP: 5.0000 mL | ORAL_SOLUTION | Freq: Three times a day (TID) | ORAL | 0 refills | Status: DC | PRN

## 2018-01-20 MED ORDER — AMOXICILLIN 875 MG PO TABS
875.0000 mg | ORAL_TABLET | Freq: Two times a day (BID) | ORAL | 0 refills | Status: DC
Start: 2018-01-20 — End: 2018-05-03

## 2018-01-20 NOTE — Progress Notes (Signed)
   Subjective:    Patient ID: Cory Parker, male    DOB: 1941/05/24, 77 y.o.   MRN: [id number]  HPI This is a 77 yo male who presents today with sinus problems x 5 days. Felt like a cold coming on. Now with headache, burning eyes, scratchy throat, cough. Pain in back with cough. Has been taking ibuprofen and tylenol. Back better today. Cough dry. No cough at night. Has felt feverish. No SOB or wheeze. Was feeling better but now feeling worse.   Past Medical History:  Diagnosis Date  . Varicose veins    Past Surgical History:  Procedure Laterality Date  . APPENDECTOMY    . finger torn tendon laceration    . TONSILLECTOMY     Family History  Problem Relation Age of Onset  . Kidney disease Mother   . Heart disease Mother   . Heart attack Father    Social History   Tobacco Use  . Smoking status: Never Smoker  . Smokeless tobacco: Never Used  Substance Use Topics  . Alcohol use: Yes    Alcohol/week: 0.6 oz    Types: 1 Cans of beer per week  . Drug use: No      Review of Systems Per HPI    Objective:   Physical Exam  Constitutional: He is oriented to person, place, and time. He appears well-developed and well-nourished. No distress.  HENT:  Head: Normocephalic and atraumatic.  Right Ear: Tympanic membrane, external ear and ear canal normal.  Left Ear: Tympanic membrane, external ear and ear canal normal.  Nose: Nose normal.  Mouth/Throat: Uvula is midline, oropharynx is clear and moist and mucous membranes are normal.  Eyes: Conjunctivae are normal.  Neck: Normal range of motion. Neck supple.  Cardiovascular: Normal rate, regular rhythm and normal heart sounds.  Pulmonary/Chest: Effort normal and breath sounds normal.  Frequent dry cough.   Lymphadenopathy:    He has no cervical adenopathy.  Neurological: He is alert and oriented to person, place, and time.  Skin: Skin is warm and dry. He is not diaphoretic.  Psychiatric: He has a normal mood and affect. His  behavior is normal. Judgment and thought content normal.  Vitals reviewed.     BP (!) 114/58 (BP Location: Right Arm, Patient Position: Sitting, Cuff Size: Normal)   Pulse 81   Temp 98.7 F (37.1 C) (Oral)   Ht 5\' 9"  (1.753 m)   Wt 231 lb (104.8 kg)   SpO2 97%   BMI 34.11 kg/m  Wt Readings from Last 3 Encounters:  01/20/18 231 lb (104.8 kg)  10/19/17 236 lb (107 kg)  09/16/17 236 lb (107 kg)       Assessment & Plan:  1. Lower respiratory infection - given improvement then worsening of symptoms, will cover with antibiotic, no recent antibiotics - RTC precautions reviewed - encouraged to increase fluids - amoxicillin (AMOXIL) 875 MG tablet; Take 1 tablet (875 mg total) by mouth 2 (two) times daily.  Dispense: 14 tablet; Refill: 0 - guaiFENesin-codeine (ROBITUSSIN AC) 100-10 MG/5ML syrup; Take 5 mLs by mouth 3 (three) times daily as needed for cough.  Dispense: 120 mL; Refill: 0   Clarene Reamer, FNP-BC  [facility] [place] [facility]  01/20/2018 5:10 PM

## 2018-01-20 NOTE — Patient Instructions (Signed)
Good to see you today  If not better after antibiotic or if worse, please come back in

## 2018-01-24 ENCOUNTER — Ambulatory Visit: Payer: Medicare Other | Admitting: Primary Care

## 2018-04-28 ENCOUNTER — Other Ambulatory Visit: Payer: Self-pay | Admitting: Primary Care

## 2018-04-28 DIAGNOSIS — Z0000 Encounter for general adult medical examination without abnormal findings: Secondary | ICD-10-CM

## 2018-05-02 ENCOUNTER — Ambulatory Visit: Payer: Medicare Other

## 2018-05-03 ENCOUNTER — Ambulatory Visit (INDEPENDENT_AMBULATORY_CARE_PROVIDER_SITE_OTHER): Payer: Medicare Other

## 2018-05-03 VITALS — BP 110/84 | HR 63 | Temp 98.4°F | Ht 70.0 in | Wt 233.8 lb

## 2018-05-03 DIAGNOSIS — Z0000 Encounter for general adult medical examination without abnormal findings: Secondary | ICD-10-CM

## 2018-05-03 DIAGNOSIS — Z125 Encounter for screening for malignant neoplasm of prostate: Secondary | ICD-10-CM | POA: Diagnosis not present

## 2018-05-03 LAB — LIPID PANEL
Cholesterol: 152 mg/dL (ref 0–200)
HDL: 46.8 mg/dL (ref >39.00)
LDL Cholesterol: 87 mg/dL (ref 0–99)
NonHDL: 105.51
Total CHOL/HDL Ratio: 3
Triglycerides: 92 mg/dL (ref 0.0–149.0)
VLDL: 18.4 mg/dL (ref 0.0–40.0)

## 2018-05-03 LAB — COMPREHENSIVE METABOLIC PANEL
ALT: 18 U/L (ref 0–53)
AST: 18 U/L (ref 0–37)
Albumin: 4.3 g/dL (ref 3.5–5.2)
Alkaline Phosphatase: 65 U/L (ref 39–117)
BUN: 21 mg/dL (ref 6–23)
CO2: 27 mEq/L (ref 19–32)
Calcium: 9.4 mg/dL (ref 8.4–10.5)
Chloride: 104 mEq/L (ref 96–112)
Creatinine, Ser: 1.11 mg/dL (ref 0.40–1.50)
GFR: 68.57 mL/min (ref >60.00)
Glucose, Bld: 94 mg/dL (ref 70–99)
Potassium: 4.7 mEq/L (ref 3.5–5.1)
Sodium: 138 mEq/L (ref 135–145)
Total Bilirubin: 0.7 mg/dL (ref 0.2–1.2)
Total Protein: 6.3 g/dL (ref 6.0–8.3)

## 2018-05-03 LAB — PSA: PSA: 5.59 ng/mL — ABNORMAL HIGH (ref 0.10–4.00)

## 2018-05-03 NOTE — Patient Instructions (Signed)
Mr. Carlyon , Thank you for taking time to come for your Medicare Wellness Visit. I appreciate your ongoing commitment to your health goals. Please review the following plan we discussed and let me know if I can assist you in the future.   These are the goals we discussed: Goals    . Increase physical activity     Starting 05/03/2018, I will continue to walk 60 minutes 5 days per week.        This is a list of the screening recommended for you and due dates:  Health Maintenance  Topic Date Due  . Flu Shot  05/09/2018*  . Colon Cancer Screening  09/12/2018  . Tetanus Vaccine  03/28/2024  . Pneumonia vaccines  Completed  *Topic was postponed. The date shown is not the original due date.   Preventive Care for Adults  A healthy lifestyle and preventive care can promote health and wellness. Preventive health guidelines for adults include the following key practices.  . A routine yearly physical is a good way to check with your health care provider about your health and preventive screening. It is a chance to share any concerns and updates on your health and to receive a thorough exam.  . Visit your dentist for a routine exam and preventive care every 6 months. Brush your teeth twice a day and floss once a day. Good oral hygiene prevents tooth decay and gum disease.  . The frequency of eye exams is based on your age, health, family medical history, use  of contact lenses, and other factors. Follow your health care provider's recommendations for frequency of eye exams.  . Eat a healthy diet. Foods like vegetables, fruits, whole grains, low-fat dairy products, and lean protein foods contain the nutrients you need without too many calories. Decrease your intake of foods high in solid fats, added sugars, and salt. Eat the right amount of calories for you. Get information about a proper diet from your health care provider, if necessary.  . Regular physical exercise is one of the most important  things you can do for your health. Most adults should get at least 150 minutes of moderate-intensity exercise (any activity that increases your heart rate and causes you to sweat) each week. In addition, most adults need muscle-strengthening exercises on 2 or more days a week.  Silver Sneakers may be a benefit available to you. To determine eligibility, you may visit the website: www.silversneakers.com or contact program at (830) 087-6377 Mon-Fri between 8AM-8PM.   . Maintain a healthy weight. The body mass index (BMI) is a screening tool to identify possible weight problems. It provides an estimate of body fat based on height and weight. Your health care provider can find your BMI and can help you achieve or maintain a healthy weight.   For adults 20 years and older: ? A BMI below 18.5 is considered underweight. ? A BMI of 18.5 to 24.9 is normal. ? A BMI of 25 to 29.9 is considered overweight. ? A BMI of 30 and above is considered obese.   . Maintain normal blood lipids and cholesterol levels by exercising and minimizing your intake of saturated fat. Eat a balanced diet with plenty of fruit and vegetables. Blood tests for lipids and cholesterol should begin at age 47 and be repeated every 5 years. If your lipid or cholesterol levels are high, you are over 50, or you are at high risk for heart disease, you may need your cholesterol levels checked more frequently.  Ongoing high lipid and cholesterol levels should be treated with medicines if diet and exercise are not working.  . If you smoke, find out from your health care provider how to quit. If you do not use tobacco, please do not start.  . If you choose to drink alcohol, please do not consume more than 2 drinks per day. One drink is considered to be 12 ounces (355 mL) of beer, 5 ounces (148 mL) of wine, or 1.5 ounces (44 mL) of liquor.  . If you are 90-43 years old, ask your health care provider if you should take aspirin to prevent  strokes.  . Use sunscreen. Apply sunscreen liberally and repeatedly throughout the day. You should seek shade when your shadow is shorter than you. Protect yourself by wearing long sleeves, pants, a wide-brimmed hat, and sunglasses year round, whenever you are outdoors.  . Once a month, do a whole body skin exam, using a mirror to look at the skin on your back. Tell your health care provider of new moles, moles that have irregular borders, moles that are larger than a pencil eraser, or moles that have changed in shape or color.

## 2018-05-03 NOTE — Progress Notes (Signed)
Subjective:   Cory Parker is a 77 y.o. male who presents for Medicare Annual/Subsequent preventive examination.  Review of Systems:  N/A Cardiac Risk Factors include: advanced age (>44men, >54 women);male gender     Objective:    Vitals: BP 110/84 (BP Location: Right Arm, Patient Position: Sitting, Cuff Size: Normal)   Pulse 63   Temp 98.4 F (36.9 C) (Oral)   Ht 5\' 10"  (1.778 m) Comment: shoes  Wt 233 lb 12 oz (106 kg)   SpO2 95%   BMI 33.54 kg/m   Body mass index is 33.54 kg/m.  Advanced Directives 05/03/2018 10/19/2017  Does Patient Have a Medical Advance Directive? Yes Yes  Type of Paramedic of [place];Living will [place];Living will  Copy of [place] in Chart? No - copy requested -    Tobacco Social History   Tobacco Use  Smoking Status Never Smoker  Smokeless Tobacco Never Used     Counseling given: No   Clinical Intake:  Pre-visit preparation completed: Yes  Pain : No/denies pain Pain Score: 0-No pain     Nutritional Status: BMI > 30  Obese Nutritional Risks: None Diabetes: No  How often do you need to have someone help you when you read instructions, pamphlets, or other written materials from your doctor or pharmacy?: 1 - Never What is the last grade level you completed in school?: Bachelor degree  Interpreter Needed?: No  Comments: pt lives with spouse Information entered by :: LPinson, LPN  Past Medical History:  Diagnosis Date  . Varicose veins    Past Surgical History:  Procedure Laterality Date  . APPENDECTOMY    . finger torn tendon laceration    . TONSILLECTOMY     Family History  Problem Relation Age of Onset  . Heart attack Father   . Kidney disease Mother   . Heart disease Mother    Social History   Socioeconomic History  . Marital status: Married    Spouse name: Not on file  . Number of children: Not on file  . Years of education: Not on file    . Highest education level: Not on file  Occupational History  . Not on file  Social Needs  . Financial resource strain: Not on file  . Food insecurity:    Worry: Not on file    Inability: Not on file  . Transportation needs:    Medical: Not on file    Non-medical: Not on file  Tobacco Use  . Smoking status: Never Smoker  . Smokeless tobacco: Never Used  Substance and Sexual Activity  . Alcohol use: Yes    Alcohol/week: 1.0 - 2.0 standard drinks    Types: 1 - 2 Cans of beer per week  . Drug use: No  . Sexual activity: Not on file  Lifestyle  . Physical activity:    Days per week: Not on file    Minutes per session: Not on file  . Stress: Not on file  Relationships  . Social connections:    Talks on phone: Not on file    Gets together: Not on file    Attends religious service: Not on file    Active member of club or organization: Not on file    Attends meetings of clubs or organizations: Not on file    Relationship status: Not on file  Other Topics Concern  . Not on file  Social History Narrative  . Not  on file    Outpatient Encounter Medications as of 05/03/2018  Medication Sig  . aspirin 81 MG tablet Take 81 mg by mouth daily.    . calcium-vitamin D (OSCAL WITH D) 500-200 MG-UNIT per tablet Take 1 tablet by mouth daily.    . cholecalciferol (VITAMIN D) 400 units TABS tablet Take 400 Units by mouth.  . DAILY MULTIPLE VITAMINS PO Take by mouth daily.    . Folic Acid 20 MG CAPS Take by mouth daily.    . vitamin C (ASCORBIC ACID) 500 MG tablet Take 500 mg by mouth daily.    . [DISCONTINUED] amoxicillin (AMOXIL) 875 MG tablet Take 1 tablet (875 mg total) by mouth 2 (two) times daily.  Marland Kitchen guaiFENesin-codeine (ROBITUSSIN AC) 100-10 MG/5ML syrup Take 5 mLs by mouth 3 (three) times daily as needed for cough. (Patient not taking: Reported on 05/03/2018)   No facility-administered encounter medications on file as of 05/03/2018.     Activities of Daily Living In your  present state of health, do you have any difficulty performing the following activities: 05/03/2018  Hearing? Y  Vision? N  Difficulty concentrating or making decisions? N  Walking or climbing stairs? N  Dressing or bathing? N  Doing errands, shopping? N  Preparing Food and eating ? N  Using the Toilet? N  In the past six months, have you accidently leaked urine? N  Do you have problems with loss of bowel control? N  Managing your Medications? N  Managing your Finances? N  Housekeeping or managing your Housekeeping? N  Some recent data might be hidden    Patient Care Team: [name], NP as PCP - General (Internal Medicine)   Assessment:   This is a routine wellness examination for [name].  Hearing Screening Comments: Bilateral hearing aids Vision Screening Comments: Vision exam in June 2019 with Dr. Macarthur Critchley  Exercise Activities and Dietary recommendations Current Exercise Habits: Home exercise routine, Type of exercise: walking, Time (Minutes): > 60, Frequency (Times/Week): 5, Weekly Exercise (Minutes/Week): 0, Intensity: Mild, Exercise limited by: None identified  Goals    . Increase physical activity     Starting 05/03/2018, I will continue to walk 60 minutes 5 days per week.        Fall Risk Fall Risk  05/03/2018 03/28/2014  Falls in the past year? No No   Depression Screen PHQ 2/9 Scores 05/03/2018 03/28/2014  PHQ - 2 Score 0 0  PHQ- 9 Score 0 -    Cognitive Function MMSE - Mini Mental State Exam 05/03/2018  Orientation to time 5  Orientation to Place 5  Registration 3  Attention/ Calculation 0  Recall 3  Language- name 2 objects 0  Language- repeat 1  Language- follow 3 step command 3  Language- read & follow direction 0  Write a sentence 0  Copy design 0  Total score 20     PLEASE NOTE: A Mini-Cog screen was completed. Maximum score is 20. A value of 0 denotes this part of [place] MMSE was not completed or the patient failed this part of the  Mini-Cog screening.   Mini-Cog Screening Orientation to Time - Max 5 pts Orientation to Place - Max 5 pts Registration - Max 3 pts Recall - Max 3 pts Language Repeat - Max 1 pts Language Follow 3 Step Command - Max 3 pts     Immunization History  Administered Date(s) Administered  . Influenza Split 04/15/2011  . Influenza, High Dose Seasonal PF 05/09/2015  .  Influenza,inj,Quad PF,6+ Mos 03/30/2013, 03/28/2014, 04/12/2016, 04/29/2017  . Pneumococcal Conjugate-13 03/28/2014  . Pneumococcal Polysaccharide-23 02/18/2010, 03/30/2013  . Td 07/05/2001  . Tetanus 03/28/2014  . Zoster 04/01/2012    Screening Tests Health Maintenance  Topic Date Due  . INFLUENZA VACCINE  05/09/2018 (Originally 02/02/2018)  . COLONOSCOPY  09/12/2018  . TETANUS/TDAP  03/28/2024  . PNA vac Low Risk Adult  Completed        Plan:     I have personally reviewed, addressed, and noted the following in the patient's chart:  A. Medical and social history B. Use of alcohol, tobacco or illicit drugs  C. Current medications and supplements D. Functional ability and status E.  Nutritional status F.  Physical activity G. Advance directives H. List of other physicians I.  Hospitalizations, surgeries, and ER visits in previous 12 months J.  [place] to include hearing, vision, cognitive, depression L. Referrals and appointments - none  In addition, I have reviewed and discussed with patient certain preventive protocols, quality metrics, and best practice recommendations. A written personalized care plan for preventive services as well as general preventive health recommendations were provided to patient.  See attached scanned questionnaire for additional information.   Signed,   Lindell Noe, MHA, BS, LPN Health Coach

## 2018-05-03 NOTE — Progress Notes (Signed)
PCP notes:   Health maintenance:  Flu vaccine - postponed until CPE per pt request  Abnormal screenings:   None  Patient concerns:   None  Nurse concerns:  None  Next PCP appt:   05/09/2018 @ 0900

## 2018-05-04 NOTE — Progress Notes (Signed)
I reviewed health advisor's note, was available for consultation, and agree with documentation and plan.  

## 2018-05-09 ENCOUNTER — Ambulatory Visit (INDEPENDENT_AMBULATORY_CARE_PROVIDER_SITE_OTHER): Payer: Medicare Other | Admitting: Primary Care

## 2018-05-09 ENCOUNTER — Encounter: Payer: Self-pay | Admitting: Primary Care

## 2018-05-09 VITALS — BP 124/74 | HR 52 | Temp 98.2°F | Ht 70.0 in | Wt 235.8 lb

## 2018-05-09 DIAGNOSIS — N401 Enlarged prostate with lower urinary tract symptoms: Secondary | ICD-10-CM

## 2018-05-09 DIAGNOSIS — I872 Venous insufficiency (chronic) (peripheral): Secondary | ICD-10-CM

## 2018-05-09 DIAGNOSIS — R972 Elevated prostate specific antigen [PSA]: Secondary | ICD-10-CM | POA: Diagnosis not present

## 2018-05-09 DIAGNOSIS — Z23 Encounter for immunization: Secondary | ICD-10-CM

## 2018-05-09 DIAGNOSIS — Z0000 Encounter for general adult medical examination without abnormal findings: Secondary | ICD-10-CM | POA: Diagnosis not present

## 2018-05-09 DIAGNOSIS — M79605 Pain in left leg: Secondary | ICD-10-CM | POA: Diagnosis not present

## 2018-05-09 NOTE — Patient Instructions (Signed)
Start exercising. You should be getting 150 minutes of exercise weekly.  Continue to work on [employer]. Eat plenty of vegetables, fruit, whole grains, lean protein.  You will be contacted regarding your referral to Urology.  Please let us know if you have not been contacted within one week.   We will see you in one year for your annual exam or sooner if needed.  It was a pleasure to see you today!   Preventive Care 30 Years and Older, Male Preventive care refers to lifestyle choices and visits with your health care provider that can promote health and wellness. What does preventive care include?  A yearly physical exam. This is also called an annual well check.  Dental exams once or twice a year.  Routine eye exams. Ask your health care provider how often you should have your eyes checked.  Personal lifestyle choices, including: ? Daily care of your teeth and gums. ? Regular physical activity. ? Eating a healthy diet. ? Avoiding tobacco and drug use. ? Limiting alcohol use. ? Practicing safe sex. ? Taking low doses of aspirin every day. ? Taking vitamin and mineral supplements as recommended by your health care provider. What happens during an annual well check? The services and screenings done by your health care provider during your annual well check will depend on your age, overall health, lifestyle risk factors, and family history of disease. Counseling Your health care provider may ask you questions about your:  Alcohol use.  Tobacco use.  Drug use.  Emotional well-being.  Home and relationship well-being.  Sexual activity.  Eating habits.  History of falls.  Memory and ability to understand (cognition).  Work and work Statistician.  Screening You may have the following tests or measurements:  Height, weight, and BMI.  Blood pressure.  Lipid and cholesterol levels. These may be checked every 5 years, or more frequently if you are over 30 years  old.  Skin check.  Lung cancer screening. You may have this screening every year starting at age 38 if you have a 30-pack-year history of smoking and currently smoke or have quit within the past 15 years.  Fecal occult blood test (FOBT) of the stool. You may have this test every year starting at age 70.  Flexible sigmoidoscopy or colonoscopy. You may have a sigmoidoscopy every 5 years or a colonoscopy every 10 years starting at age 31.  Prostate cancer screening. Recommendations will vary depending on your family history and other risks.  Hepatitis C blood test.  Hepatitis B blood test.  Sexually transmitted disease (STD) testing.  Diabetes screening. This is done by checking your blood sugar (glucose) after you have not eaten for a while (fasting). You may have this done every 1-3 years.  Abdominal aortic aneurysm (AAA) screening. You may need this if you are a current or former smoker.  Osteoporosis. You may be screened starting at age 55 if you are at high risk.  Talk with your health care provider about your test results, treatment options, and if necessary, the need for more tests. Vaccines Your health care provider may recommend certain vaccines, such as:  Influenza vaccine. This is recommended every year.  Tetanus, diphtheria, and acellular pertussis (Tdap, Td) vaccine. You may need a Td booster every 10 years.  Varicella vaccine. You may need this if you have not been vaccinated.  Zoster vaccine. You may need this after age 81.  Measles, mumps, and rubella (MMR) vaccine. You may need at least  one dose of MMR if you were born in 1957 or later. You may also need a second dose.  Pneumococcal 13-valent conjugate (PCV13) vaccine. One dose is recommended after age 38.  Pneumococcal polysaccharide (PPSV23) vaccine. One dose is recommended after age 8.  Meningococcal vaccine. You may need this if you have certain conditions.  Hepatitis A vaccine. You may need this if you  have certain conditions or if you travel or work in places where you may be exposed to hepatitis A.  Hepatitis B vaccine. You may need this if you have certain conditions or if you travel or work in places where you may be exposed to hepatitis B.  Haemophilus influenzae type b (Hib) vaccine. You may need this if you have certain risk factors.  Talk to your health care provider about which screenings and vaccines you need and how often you need them. This information is not intended to replace advice given to you by your health care provider. Make sure you discuss any questions you have with your health care provider. Document Released: 07/18/2015 Document Revised: 03/10/2016 Document Reviewed: 04/22/2015 Elsevier Interactive Patient Education  [employer].

## 2018-05-09 NOTE — Assessment & Plan Note (Signed)
Immunizations UTD. Influenza vaccination provided today. Colonoscopy UTD, due in 2020. PSA elevated, see notes. Recommended he work on diet, start regular exercise. Exam stable. Labs reviewed. Follow up in 1 year for CPE.

## 2018-05-09 NOTE — Progress Notes (Signed)
Subjective:    Patient ID: Cory Parker, male    DOB: 1940/10/10, 77 y.o.   MRN: [id number]  HPI  Mr. Bangura is a 77 year old male who presents today for complete physical.  Immunizations: -Tetanus: Completed in 2015 -Influenza: Due this season -Pneumonia: Completed last in 2015 -Shingles: Completed Zostavax in 2013  Diet: He endorses a fair diet.  Breakfast: Cereal, toast, fruit Lunch: Fruit, sandwich, granola bar Dinner: Soup, beans, sandwich  Snacks: Occasionally, nuts Desserts: 1-2 times weekly  Beverages: Water, coffee, lemonade, occasional beer, soda/sweet tea 1-2 times weekly.  Exercise: He is not exercising, active at home  Eye exam: Completed in 2019 Dental exam: Completes annually Colonoscopy: Completed in 2010 PSA: Elevated at 5.59 in 2019   Review of Systems  Constitutional: Negative for unexpected weight change.  HENT: Negative for rhinorrhea.   Respiratory: Negative for cough and shortness of breath.   Cardiovascular: Negative for chest pain.  Gastrointestinal: Negative for constipation and diarrhea.  Genitourinary: Positive for difficulty urinating.       Nocturia once nightly, urinary frequency during the day  Musculoskeletal: Negative for arthralgias.  Skin: Negative for rash.  Allergic/Immunologic: Negative for environmental allergies.  Neurological: Negative for dizziness, numbness and headaches.  Psychiatric/Behavioral: The patient is not nervous/anxious.        Past Medical History:  Diagnosis Date  . Varicose veins      Social History   Socioeconomic History  . Marital status: Married    Spouse name: Not on file  . Number of children: Not on file  . Years of education: Not on file  . Highest education level: Not on file  Occupational History  . Not on file  Social Needs  . Financial resource strain: Not on file  . Food insecurity:    Worry: Not on file    Inability: Not on file  . Transportation needs:    Medical: Not on  file    Non-medical: Not on file  Tobacco Use  . Smoking status: Never Smoker  . Smokeless tobacco: Never Used  Substance and Sexual Activity  . Alcohol use: Yes    Alcohol/week: 1.0 - 2.0 standard drinks    Types: 1 - 2 Cans of beer per week  . Drug use: No  . Sexual activity: Not on file  Lifestyle  . Physical activity:    Days per week: Not on file    Minutes per session: Not on file  . Stress: Not on file  Relationships  . Social connections:    Talks on phone: Not on file    Gets together: Not on file    Attends religious service: Not on file    Active member of club or organization: Not on file    Attends meetings of clubs or organizations: Not on file    Relationship status: Not on file  . Intimate partner violence:    Fear of current or ex partner: Not on file    Emotionally abused: Not on file    Physically abused: Not on file    Forced sexual activity: Not on file  Other Topics Concern  . Not on file  Social History Narrative  . Not on file    Past Surgical History:  Procedure Laterality Date  . APPENDECTOMY    . finger torn tendon laceration    . TONSILLECTOMY      Family History  Problem Relation Age of Onset  . Heart attack Father   .  Kidney disease Mother   . Heart disease Mother     No Known Allergies  Current Outpatient Medications on File Prior to Visit  Medication Sig Dispense Refill  . aspirin 81 MG tablet Take 81 mg by mouth daily.      . calcium-vitamin D (OSCAL WITH D) 500-200 MG-UNIT per tablet Take 1 tablet by mouth daily.      . cholecalciferol (VITAMIN D) 400 units TABS tablet Take 400 Units by mouth.    . DAILY MULTIPLE VITAMINS PO Take by mouth daily.      . Folic Acid 20 MG CAPS Take by mouth daily.      . vitamin C (ASCORBIC ACID) 500 MG tablet Take 500 mg by mouth daily.       No current facility-administered medications on file prior to visit.     BP 124/74   Pulse (!) 52   Temp 98.2 F (36.8 C) (Oral)   Ht 5\' 10"   (1.778 m)   Wt 235 lb 12 oz (106.9 kg)   SpO2 94%   BMI 33.83 kg/m    Objective:   Physical Exam  Constitutional: He is oriented to person, place, and time. He appears well-nourished.  HENT:  Mouth/Throat: No oropharyngeal exudate.  Eyes: Pupils are equal, round, and reactive to light. EOM are normal.  Neck: Neck supple. No thyromegaly present.  Cardiovascular: Normal rate and regular rhythm.  Extremity swelling to bilateral lower extremities. Trace pitting to left. Left with more swelling than right.  Respiratory: Effort normal and breath sounds normal.  GI: Soft. Bowel sounds are normal. There is no tenderness.  Musculoskeletal: Normal range of motion.  Neurological: He is alert and oriented to person, place, and time.  Skin: Skin is warm and dry.  Psychiatric: He has a normal mood and affect.           Assessment & Plan:

## 2018-05-09 NOTE — Assessment & Plan Note (Signed)
Compliant to compression stockings as prescribed by vascular, last evaluated in April 2019. Denies concerns today.

## 2018-05-09 NOTE — Assessment & Plan Note (Signed)
Resolved. Did follow with vein and vascular. Wearing support stockings as directed.

## 2018-05-09 NOTE — Assessment & Plan Note (Signed)
Likely secondary to BPH symptoms, referral placed to Urology per patient request.

## 2018-05-09 NOTE — Addendum Note (Signed)
Addended by: Jacqualin Combes on: 05/09/2018 12:01 PM   Modules accepted: Orders

## 2018-05-09 NOTE — Assessment & Plan Note (Signed)
Steady rise in PSA, also with symptoms of BPH. Patient declines exam today and would prefer to see Urology. Rise in PSA could be secondary to BPH symptoms. Referral placed to Urology for confirmation.

## 2018-09-29 NOTE — Telephone Encounter (Signed)
Lvm asking pt to call office 

## 2018-09-29 NOTE — Telephone Encounter (Signed)
Raquel Sarna, Web Ex please. Thanks!

## 2018-10-02 ENCOUNTER — Ambulatory Visit (INDEPENDENT_AMBULATORY_CARE_PROVIDER_SITE_OTHER): Payer: Medicare Other | Admitting: Primary Care

## 2018-10-02 ENCOUNTER — Other Ambulatory Visit: Payer: Self-pay

## 2018-10-02 ENCOUNTER — Encounter: Payer: Self-pay | Admitting: Primary Care

## 2018-10-02 DIAGNOSIS — R600 Localized edema: Secondary | ICD-10-CM | POA: Diagnosis not present

## 2018-10-02 DIAGNOSIS — I872 Venous insufficiency (chronic) (peripheral): Secondary | ICD-10-CM | POA: Diagnosis not present

## 2018-10-02 DIAGNOSIS — N401 Enlarged prostate with lower urinary tract symptoms: Secondary | ICD-10-CM

## 2018-10-02 NOTE — Progress Notes (Signed)
Subjective:    Patient ID: Cory Parker, male    DOB: [birth year], 78 y.o.   MRN: [id number]  HPI  Virtual Visit via Video Note  I connected with Cory Parker on 10/02/18 at  8:00 AM EDT by a video enabled telemedicine application and verified that I am speaking with the correct person using two identifiers. He is at home, I am in the office.   I discussed the limitations of evaluation and management by telemedicine and the availability of in person appointments. The patient expressed understanding and agreed to proceed.  History of Present Illness:  Mr. Tomeo is a 78 year old male with a history of BPH, elevated PSA, venous insufficiency who presents today via video with a chief complaint of lower extremity edema. He is currently following with Urology and Vascular Surgery. He is managed on Tamsulosin 0.4 mg which was initiated approximately 3 months ago. He saw vascular surgery (Dr. Scot Dock) in April 2019, underwent vascular lower extremity reflux study to the left lower extremity which was abnormal.  It was recommended he start wearing knee high compression stockings with a gradient of 15-20 mmHg, also start water aerobics. He was encouraged to call back if symptoms persisted.   After his visit in April 2019 his swelling significantly improved, but around January 2020 his ankle edema worsened. His main concern today is ankle and foot edema edema. His ankles and feet are mostly normal when waking in the morning but will gradually "blow up" within a few hours. He denies cough, chest pain, shortness of breath, skin color changes. He is active and endorses walking most every afternoon weather permitting. He is compliant to his knee high compression stockings daily. He will also elevate his legs with improvement. He is working still and is predominantly at a desk, does try to walking some. He has not since contacted Dr. Nicole Cella office.   Observations/Objective:  No distress. Mild edema  noted to left foot, ankle, and lower extremity.   Assessment and Plan:  Suspect symptoms are still secondary to venous insufficiency, lower suspicion for other cardiac cause including CHF. Very low suspicion for DVT. Will start by checking some labs including BMP and BNP. Also discussed to increase the gradient of his compression stockings to 20-30 mmHg and/or calling Dr. Nicole Cella office for further advice. We discussed to continue with daily elevation of extremities, regular activity. Await lab results.  Follow Up Instructions:  Schedule a lab only appointment for labs. Continue to elevate your legs when resting. Make sure to get up at least every one hour during your work day to walk. Consider trying an increased gradient of compression stockings to 20-30 mmHg as discussed. Consider calling Dr. Nicole Cella office for further advice. I will be in touch with your lab results once received.   Nice to see you! Allie Bossier, NP-C    I discussed the assessment and treatment plan with the patient. The patient was provided an opportunity to ask questions and all were answered. The patient agreed with the plan and demonstrated an understanding of the instructions.   The patient was advised to call back or seek an in-person evaluation if the symptoms worsen or if the condition fails to improve as anticipated.     [name], NP    Review of Systems  Constitutional: Negative for fatigue.  Respiratory: Negative for shortness of breath.   Cardiovascular: Positive for leg swelling. Negative for chest pain.  Genitourinary: Negative for difficulty urinating.  Skin: Negative for color change.  Neurological: Negative for weakness.       Past Medical History:  Diagnosis Date  . Varicose veins      Social History   Socioeconomic History  . Marital status: Married    Spouse name: Not on file  . Number of children: Not on file  . Years of education: Not on file  . Highest  education level: Not on file  Occupational History  . Not on file  Social Needs  . Financial resource strain: Not on file  . Food insecurity:    Worry: Not on file    Inability: Not on file  . Transportation needs:    Medical: Not on file    Non-medical: Not on file  Tobacco Use  . Smoking status: Never Smoker  . Smokeless tobacco: Never Used  Substance and Sexual Activity  . Alcohol use: Yes    Alcohol/week: 1.0 - 2.0 standard drinks    Types: 1 - 2 Cans of beer per week  . Drug use: No  . Sexual activity: Not on file  Lifestyle  . Physical activity:    Days per week: Not on file    Minutes per session: Not on file  . Stress: Not on file  Relationships  . Social connections:    Talks on phone: Not on file    Gets together: Not on file    Attends religious service: Not on file    Active member of club or organization: Not on file    Attends meetings of clubs or organizations: Not on file    Relationship status: Not on file  . Intimate partner violence:    Fear of current or ex partner: Not on file    Emotionally abused: Not on file    Physically abused: Not on file    Forced sexual activity: Not on file  Other Topics Concern  . Not on file  Social History Narrative  . Not on file    Past Surgical History:  Procedure Laterality Date  . APPENDECTOMY    . finger torn tendon laceration    . TONSILLECTOMY      Family History  Problem Relation Age of Onset  . Heart attack Father   . Kidney disease Mother   . Heart disease Mother     No Known Allergies  Current Outpatient Medications on File Prior to Visit  Medication Sig Dispense Refill  . aspirin 81 MG tablet Take 81 mg by mouth daily.      . calcium-vitamin D (OSCAL WITH D) 500-200 MG-UNIT per tablet Take 1 tablet by mouth daily.      . cholecalciferol (VITAMIN D) 400 units TABS tablet Take 400 Units by mouth.    . DAILY MULTIPLE VITAMINS PO Take by mouth daily.      . Folic Acid 20 MG CAPS Take by mouth  daily.      . tamsulosin (FLOMAX) 0.4 MG CAPS capsule     . vitamin C (ASCORBIC ACID) 500 MG tablet Take 500 mg by mouth daily.       No current facility-administered medications on file prior to visit.     There were no vitals taken for this visit.   Objective:   Physical Exam  Constitutional: He is oriented to person, place, and time. He appears well-nourished.  Cardiovascular:  Mild edema noted to left foot, ankle, and lower extremity via video  Respiratory: Effort normal.  Neurological: He is alert and  oriented to person, place, and time.  Skin: Skin is warm and dry.  Psychiatric: He has a normal mood and affect.           Assessment & Plan:

## 2018-10-02 NOTE — Assessment & Plan Note (Signed)
Following with Urology, recent PSA levels have decreased per patient. Due for repeat PSA in June 2020. Continue Tamsulosin.

## 2018-10-02 NOTE — Assessment & Plan Note (Signed)
Suspect symptoms are still secondary to venous insufficiency, lower suspicion for other cardiac cause including CHF. Very low suspicion for DVT. Will start by checking some labs including BMP and BNP. Also discussed to increase the gradient of his compression stockings to 20-30 mmHg and/or calling Dr. Nicole Cella office for further advice. We discussed to continue with daily elevation of extremities, regular activity. Await lab results.

## 2018-10-02 NOTE — Patient Instructions (Signed)
Schedule a lab only appointment for labs. Continue to elevate your legs when resting. Make sure to get up at least every one hour during your work day to walk. Consider trying an increased gradient of compression stockings to 20-30 mmHg as discussed. Consider calling Dr. Nicole Cella office for further advice. I will be in touch with your lab results once received.   Nice to see you! Allie Bossier, NP-C

## 2018-10-03 ENCOUNTER — Other Ambulatory Visit (INDEPENDENT_AMBULATORY_CARE_PROVIDER_SITE_OTHER): Payer: Medicare Other

## 2018-10-03 DIAGNOSIS — R600 Localized edema: Secondary | ICD-10-CM

## 2018-10-03 LAB — BASIC METABOLIC PANEL
BUN: 22 mg/dL (ref 6–23)
CO2: 26 mEq/L (ref 19–32)
Calcium: 8.7 mg/dL (ref 8.4–10.5)
Chloride: 106 mEq/L (ref 96–112)
Creatinine, Ser: 1.17 mg/dL (ref 0.40–1.50)
GFR: 60.64 mL/min (ref >60.00)
Glucose, Bld: 112 mg/dL — ABNORMAL HIGH (ref 70–99)
Potassium: 4.2 mEq/L (ref 3.5–5.1)
Sodium: 139 mEq/L (ref 135–145)

## 2018-10-03 LAB — BRAIN NATRIURETIC PEPTIDE: Pro B Natriuretic peptide (BNP): 37 pg/mL (ref 0.0–100.0)

## 2019-01-01 ENCOUNTER — Telehealth: Payer: Self-pay

## 2019-01-01 NOTE — Telephone Encounter (Signed)
Pt saw [facility] last week and was having pain upon urination; pt was advised to cb if symptoms did not improve. Pt said now having frequency of urine and still pain when starts to urinate. No back pain; pt will call [facility] back and if needed pt will call [facility] for appt. FYI to Gentry Fitz NP.

## 2019-01-01 NOTE — Telephone Encounter (Signed)
Noted  

## 2019-01-02 ENCOUNTER — Telehealth (HOSPITAL_COMMUNITY): Payer: Self-pay | Admitting: Rehabilitation

## 2019-01-02 NOTE — Telephone Encounter (Signed)

## 2019-01-03 ENCOUNTER — Ambulatory Visit (HOSPITAL_COMMUNITY)
Admission: RE | Admit: 2019-01-03 | Discharge: 2019-01-03 | Disposition: A | Discharge status: Home / Self Care | Payer: Medicare Other | Source: Ambulatory Visit | Attending: Vascular Surgery | Admitting: Vascular Surgery

## 2019-01-03 ENCOUNTER — Other Ambulatory Visit: Payer: Self-pay

## 2019-01-03 ENCOUNTER — Encounter: Payer: Self-pay | Admitting: Vascular Surgery

## 2019-01-03 ENCOUNTER — Ambulatory Visit: Payer: Medicare Other | Admitting: Vascular Surgery

## 2019-01-03 VITALS — BP 99/55 | HR 78 | Temp 97.8°F | Resp 20 | Ht 70.0 in | Wt 238.0 lb

## 2019-01-03 DIAGNOSIS — I872 Venous insufficiency (chronic) (peripheral): Secondary | ICD-10-CM

## 2019-01-03 DIAGNOSIS — M79605 Pain in left leg: Secondary | ICD-10-CM

## 2019-01-03 NOTE — Progress Notes (Signed)
Patient name: Cory Parker MRN: [id number] DOB: Jun 08, 1941 Sex: male  REASON FOR VISIT:   Bilateral foot swelling.  HPI:   Cory Parker is a pleasant 78 y.o. male who had previously seen in consultation with left lower extremity swelling and possible venous insufficiency.  This was in April 2019.  At that time he had no evidence of DVT in the left lower extremity.  Based on his physical exam and history I think he did have evidence of chronic venous insufficiency bilaterally.  We discussed conservative measures at that time.  He is developed recurrent swelling in both legs and comes in for follow-up.  Of note, this patient had undergone previous laser ablation of the right great saphenous vein with stab phlebectomies by Dr. Kellie Simmering in the past.  Patient has noted that over the last several months has had increased swelling in both legs more significantly on the left side.  His symptoms are aggravated by standing and relieved somewhat with elevation.  He has been wearing his compression stockings when he was on his feet and these do help.  He has not required ibuprofen.  He has no previous history of DVT.  Past Medical History:  Diagnosis Date  . Varicose veins     Family History  Problem Relation Age of Onset  . Heart attack Father   . Kidney disease Mother   . Heart disease Mother     SOCIAL HISTORY: Social History   Tobacco Use  . Smoking status: Never Smoker  . Smokeless tobacco: Never Used  Substance Use Topics  . Alcohol use: Yes    Alcohol/week: 1.0 - 2.0 standard drinks    Types: 1 - 2 Cans of beer per week    Allergies  Allergen Reactions  . Codeine     Current Outpatient Medications  Medication Sig Dispense Refill  . aspirin 81 MG tablet Take 81 mg by mouth daily.      . calcium-vitamin D (OSCAL WITH D) 500-200 MG-UNIT per tablet Take 1 tablet by mouth daily.      . cholecalciferol (VITAMIN D) 400 units TABS tablet Take 400 Units by mouth.    . DAILY  MULTIPLE VITAMINS PO Take by mouth daily.      . Folic Acid 20 MG CAPS Take by mouth daily.      . tamsulosin (FLOMAX) 0.4 MG CAPS capsule     . vitamin C (ASCORBIC ACID) 500 MG tablet Take 500 mg by mouth daily.       No current facility-administered medications for this visit.     REVIEW OF SYSTEMS:  [X]  denotes positive finding, [ ]  denotes negative finding Cardiac  Comments:  Chest pain or chest pressure:    Shortness of breath upon exertion:    Short of breath when lying flat:    Irregular heart rhythm:        Vascular    Pain in calf, thigh, or hip brought on by ambulation:    Pain in feet at night that wakes you up from your sleep:     Blood clot in your veins:    Leg swelling:  x       Pulmonary    Oxygen at home:    Productive cough:     Wheezing:         Neurologic    Sudden weakness in arms or legs:     Sudden numbness in arms or legs:     Sudden onset of  difficulty speaking or slurred speech:    Temporary loss of vision in one eye:     Problems with dizziness:         Gastrointestinal    Blood in stool:     Vomited blood:         Genitourinary    Burning when urinating:     Blood in urine:        Psychiatric    Major depression:         Hematologic    Bleeding problems:    Problems with blood clotting too easily:        Skin    Rashes or ulcers:        Constitutional    Fever or chills:     PHYSICAL EXAM:   Vitals:   01/03/19 1447  BP: (!) 99/55  Pulse: 78  Resp: 20  Temp: 97.8 F (36.6 C)  TempSrc: Temporal  SpO2: 97%  Weight: 238 lb (108 kg)  Height: 5\' 10"  (1.778 m)    GENERAL: The patient is a well-nourished male, in no acute distress. The vital signs are documented above. CARDIAC: There is a regular rate and rhythm.  VASCULAR: I do not detect carotid bruits. He has palpable femoral and pedal pulses bilaterally. He has mild bilateral lower extremity swelling which is more significant on the left side. He has hyperpigmentation  bilaterally and some telangiectasias bilaterally. PULMONARY: There is good air exchange bilaterally without wheezing or rales. ABDOMEN: Soft and non-tender with normal pitched bowel sounds.  MUSCULOSKELETAL: There are no major deformities or cyanosis. NEUROLOGIC: No focal weakness or paresthesias are detected. SKIN: There are no ulcers or rashes noted. PSYCHIATRIC: The patient has a normal affect.  DATA:    VENOUS DUPLEX: I have independently interpreted his venous duplex scan today.  There is no evidence of deep venous thrombosis in either lower extremity.    VENOUS REFLUX STUDY: I did review the patient's venous reflux study from 10/17/2017.  This showed significant reflux in the left great saphenous vein.  The vein was dilated up to 0.65 cm in the proximal thigh.  He also had deep venous reflux on the left.  MEDICAL ISSUES:   CHRONIC VENOUS INSUFFICIENCY: Based on this patient's physical exam and duplex findings the patient does have evidence of chronic venous insufficiency.  He has CEAP C4 venous disease.  I have discussed with him the importance of intermittent leg elevation and the proper positioning for this.  I have encouraged him to wear his stockings.  I felt that he might benefit from thigh-high stockings with a gradient of 20 to 30 mmHg however he was not interested at this time.  He will continue with his knee-high stockings.  I have encouraged him to avoid prolonged sitting and standing.  We discussed the importance of exercise specifically walking and water aerobics.  We also discussed the importance of weight management as central obesity especially increases lower extremity venous pressure.  If his symptoms progress on the left I think we would have to repeat his reflux study and consider him for laser ablation of the left great saphenous vein.  We would want to try him in the thigh-high stockings first.  He will call if his symptoms progress.  Deitra [facility] [phone]

## 2019-01-08 ENCOUNTER — Encounter (HOSPITAL_COMMUNITY): Payer: Medicare Other

## 2019-05-02 ENCOUNTER — Other Ambulatory Visit: Payer: Self-pay | Admitting: Primary Care

## 2019-05-02 DIAGNOSIS — R972 Elevated prostate specific antigen [PSA]: Secondary | ICD-10-CM

## 2019-05-02 DIAGNOSIS — I872 Venous insufficiency (chronic) (peripheral): Secondary | ICD-10-CM

## 2019-05-08 ENCOUNTER — Other Ambulatory Visit: Payer: Self-pay

## 2019-05-08 ENCOUNTER — Other Ambulatory Visit (INDEPENDENT_AMBULATORY_CARE_PROVIDER_SITE_OTHER): Payer: Medicare Other

## 2019-05-08 ENCOUNTER — Ambulatory Visit: Payer: Medicare Other

## 2019-05-08 DIAGNOSIS — I872 Venous insufficiency (chronic) (peripheral): Secondary | ICD-10-CM

## 2019-05-08 DIAGNOSIS — R972 Elevated prostate specific antigen [PSA]: Secondary | ICD-10-CM | POA: Diagnosis not present

## 2019-05-08 LAB — CBC
HCT: 47.4 % (ref 39.0–52.0)
Hemoglobin: 16.2 g/dL (ref 13.0–17.0)
MCHC: 34.2 g/dL (ref 30.0–36.0)
MCV: 93.1 fl (ref 78.0–100.0)
Platelets: 172 10*3/uL (ref 150.0–400.0)
RBC: 5.1 Mil/uL (ref 4.22–5.81)
RDW: 13.3 % (ref 11.5–15.5)
WBC: 6.3 10*3/uL (ref 4.0–10.5)

## 2019-05-08 LAB — COMPREHENSIVE METABOLIC PANEL
ALT: 17 U/L (ref 0–53)
AST: 16 U/L (ref 0–37)
Albumin: 4.4 g/dL (ref 3.5–5.2)
Alkaline Phosphatase: 65 U/L (ref 39–117)
BUN: 17 mg/dL (ref 6–23)
CO2: 29 mEq/L (ref 19–32)
Calcium: 9.3 mg/dL (ref 8.4–10.5)
Chloride: 104 mEq/L (ref 96–112)
Creatinine, Ser: 1.04 mg/dL (ref 0.40–1.50)
GFR: 69.36 mL/min (ref >60.00)
Glucose, Bld: 99 mg/dL (ref 70–99)
Potassium: 4.6 mEq/L (ref 3.5–5.1)
Sodium: 138 mEq/L (ref 135–145)
Total Bilirubin: 0.7 mg/dL (ref 0.2–1.2)
Total Protein: 6.4 g/dL (ref 6.0–8.3)

## 2019-05-08 LAB — PSA: PSA: 5.47 ng/mL — ABNORMAL HIGH (ref 0.10–4.00)

## 2019-05-08 LAB — LIPID PANEL
Cholesterol: 149 mg/dL (ref 0–200)
HDL: 46.8 mg/dL (ref >39.00)
LDL Cholesterol: 82 mg/dL (ref 0–99)
NonHDL: 102.69
Total CHOL/HDL Ratio: 3
Triglycerides: 102 mg/dL (ref 0.0–149.0)
VLDL: 20.4 mg/dL (ref 0.0–40.0)

## 2019-05-09 ENCOUNTER — Ambulatory Visit (INDEPENDENT_AMBULATORY_CARE_PROVIDER_SITE_OTHER): Payer: Medicare Other

## 2019-05-09 DIAGNOSIS — Z0000 Encounter for general adult medical examination without abnormal findings: Secondary | ICD-10-CM | POA: Diagnosis not present

## 2019-05-09 NOTE — Progress Notes (Signed)
Subjective:   Cory Parker is a 78 y.o. male who presents for Medicare Annual/Subsequent preventive examination.  Review of Systems:    This visit is being conducted through telemedicine via telephone at the nurse health advisor's home address due to the COVID-19 pandemic. This patient has given me verbal consent via doximity to conduct this visit, patient states they are participating from their home address. Patient and myself are on the telephone call. There is no referral for this visit. Some vital signs may be absent or patient reported.    Patient identification: identified by name, DOB, and current address   Cardiac Risk Factors include: advanced age (>6men, >65 women);male gender     Objective:    Vitals: There were no vitals taken for this visit.  There is no height or weight on file to calculate BMI.  Advanced Directives 05/09/2019 01/03/2019 05/03/2018 10/19/2017  Does Patient Have a Medical Advance Directive? Yes Yes Yes Yes  Type of Paramedic of [place];Living will [place];Living will [place];Living will [place];Living will  Does patient want to make changes to medical advance directive? - No - Patient declined - -  Copy of [place] in Chart? No - copy requested - No - copy requested -    Tobacco Social History   Tobacco Use  Smoking Status Never Smoker  Smokeless Tobacco Never Used     Counseling given: Not Answered   Clinical Intake:  Pre-visit preparation completed: Yes  Pain : No/denies pain     Nutritional Risks: None Diabetes: No  How often do you need to have someone help you when you read instructions, pamphlets, or other written materials from your doctor or pharmacy?: 1 - Never What is the last grade level you completed in school?: college graduate  Interpreter Needed?: No  Information entered by :: CJohnson, LPN  Past Medical  History:  Diagnosis Date  . Varicose veins    Past Surgical History:  Procedure Laterality Date  . APPENDECTOMY    . finger torn tendon laceration    . TONSILLECTOMY     Family History  Problem Relation Age of Onset  . Heart attack Father   . Kidney disease Mother   . Heart disease Mother    Social History   Socioeconomic History  . Marital status: Married    Spouse name: Not on file  . Number of children: Not on file  . Years of education: Not on file  . Highest education level: Not on file  Occupational History  . Not on file  Social Needs  . Financial resource strain: Not hard at all  . Food insecurity    Worry: Never true    Inability: Never true  . Transportation needs    Medical: No    Non-medical: No  Tobacco Use  . Smoking status: Never Smoker  . Smokeless tobacco: Never Used  Substance and Sexual Activity  . Alcohol use: Yes    Alcohol/week: 1.0 - 2.0 standard drinks    Types: 1 - 2 Cans of beer per week  . Drug use: No  . Sexual activity: Not on file  Lifestyle  . Physical activity    Days per week: 0 days    Minutes per session: 0 min  . Stress: Not at all  Relationships  . Social Herbalist on phone: Not on file    Gets together: Not on  file    Attends religious service: Not on file    Active member of club or organization: Not on file    Attends meetings of clubs or organizations: Not on file    Relationship status: Not on file  Other Topics Concern  . Not on file  Social History Narrative  . Not on file    Outpatient Encounter Medications as of 05/09/2019  Medication Sig  . aspirin 81 MG tablet Take 81 mg by mouth daily.    . calcium-vitamin D (OSCAL WITH D) 500-200 MG-UNIT per tablet Take 1 tablet by mouth daily.    . cholecalciferol (VITAMIN D) 400 units TABS tablet Take 400 Units by mouth.  . DAILY MULTIPLE VITAMINS PO Take by mouth daily.    . Folic Acid 20 MG CAPS Take by mouth daily.    . tamsulosin (FLOMAX) 0.4 MG CAPS  capsule   . vitamin C (ASCORBIC ACID) 500 MG tablet Take 500 mg by mouth daily.     No facility-administered encounter medications on file as of 05/09/2019.     Activities of Daily Living In your present state of health, do you have any difficulty performing the following activities: 05/09/2019  Hearing? Y  Comment wears hearing aids  Vision? N  Difficulty concentrating or making decisions? N  Walking or climbing stairs? N  Dressing or bathing? N  Doing errands, shopping? N  Preparing Food and eating ? N  Using the Toilet? N  In the past six months, have you accidently leaked urine? N  Do you have problems with loss of bowel control? N  Managing your Medications? N  Managing your Finances? N  Housekeeping or managing your Housekeeping? N  Some recent data might be hidden    Patient Care Team: [name], NP as PCP - General (Internal Medicine)   Assessment:   This is a routine wellness examination for Cory Parker.  Exercise Activities and Dietary recommendations Current Exercise Habits: Home exercise routine, Type of exercise: walking, Time (Minutes): 20, Frequency (Times/Week): 7, Weekly Exercise (Minutes/Week): 140, Intensity: Mild, Exercise limited by: None identified  Goals    . Increase physical activity     Starting 05/03/2018, I will continue to walk 60 minutes 5 days per week.     . Patient Stated     05/09/2019, I will try to increase my exercise level as soon as the pandemic gets under control.        Fall Risk Fall Risk  05/09/2019 05/03/2018 03/28/2014  Falls in the past year? 0 No No  Number falls in past yr: 0 - -  Injury with Fall? 0 - -  Risk for fall due to : Medication side effect - -  Follow up Falls evaluation completed;Falls prevention discussed - -   Is the patient's home free of loose throw rugs in walkways, pet beds, electrical cords, etc?   yes      Grab bars in the bathroom? no      Handrails on the stairs?   yes      Adequate lighting?    yes  Timed Get Up and Go Performed: N/A  Depression Screen PHQ 2/9 Scores 05/09/2019 05/03/2018 03/28/2014  PHQ - 2 Score 0 0 0  PHQ- 9 Score 0 0 -    Cognitive Function MMSE - Mini Mental State Exam 05/09/2019 05/03/2018  Orientation to time 5 5  Orientation to Place 5 5  Registration 3 3  Attention/ Calculation 5 0  Recall  3 3  Language- name 2 objects - 0  Language- repeat 1 1  Language- follow 3 step command - 3  Language- read & follow direction - 0  Write a sentence - 0  Copy design - 0  Total score - 20  Mini Cog  Mini-Cog screen was completed. Maximum score is 22. A value of 0 denotes this part of the MMSE was not completed or the patient failed this part of the Mini-Cog screening.       Immunization History  Administered Date(s) Administered  . Influenza Split 04/15/2011  . Influenza, High Dose Seasonal PF 05/09/2015  . Influenza,inj,Quad PF,6+ Mos 03/30/2013, 03/28/2014, 04/12/2016, 04/29/2017, 05/09/2018  . Pneumococcal Conjugate-13 03/28/2014  . Pneumococcal Polysaccharide-23 02/18/2010, 03/30/2013  . Td 07/05/2001  . Tetanus 03/28/2014  . Zoster 04/01/2012    Qualifies for Shingles Vaccine? Yes   Screening Tests Health Maintenance  Topic Date Due  . TETANUS/TDAP  03/28/2024  . INFLUENZA VACCINE  Completed  . PNA vac Low Risk Adult  Completed   Cancer Screenings: Lung: Low Dose CT Chest recommended if Age 65-80 years, 30 pack-year currently smoking OR have quit w/in 15years. Patient does not qualify. Colorectal: declined at this time   Additional Screenings:  Hepatitis C Screening: N/A      Plan:   Patient wants to increase exercise once the pandemic gets under control.   I have personally reviewed and noted the following in the patient's chart:   . Medical and social history . Use of alcohol, tobacco or illicit drugs  . Current medications and supplements . Functional ability and status . Nutritional status . Physical activity .  Advanced directives . List of other physicians . Hospitalizations, surgeries, and ER visits in previous 12 months . Vitals . Screenings to include cognitive, depression, and falls . Referrals and appointments  In addition, I have reviewed and discussed with patient certain preventive protocols, quality metrics, and best practice recommendations. A written personalized care plan for preventive services as well as general preventive health recommendations were provided to patient.     Andrez Grime, LPN  [id number]

## 2019-05-09 NOTE — Patient Instructions (Signed)
Cory Parker , Thank you for taking time to come for your Medicare Wellness Visit. I appreciate your ongoing commitment to your health goals. Please review the following plan we discussed and let me know if I can assist you in the future.   Screening recommendations/referrals: Colonoscopy: declined Recommended yearly ophthalmology/optometry visit for glaucoma screening and checkup Recommended yearly dental visit for hygiene and checkup  Vaccinations: Influenza vaccine: up to date, completed 03/06/2019 Pneumococcal vaccine: Completed series Tdap vaccine: up to date, completed 03/28/2014 Shingles vaccine: discussed    Advanced directives: Please bring a copy of your POA (Power of [place]) and/or Living Will to your next appointment.   Conditions/risks identified: none  Next appointment: 05/15/2019 @ 8:20 am   Preventive Care 65 Years and Older, Male Preventive care refers to lifestyle choices and visits with your health care provider that can promote health and wellness. What does preventive care include?  A yearly physical exam. This is also called an annual well check.  Dental exams once or twice a year.  Routine eye exams. Ask your health care provider how often you should have your eyes checked.  Personal lifestyle choices, including:  Daily care of your teeth and gums.  Regular physical activity.  Eating a healthy diet.  Avoiding tobacco and drug use.  Limiting alcohol use.  Practicing safe sex.  Taking low doses of aspirin every day.  Taking vitamin and mineral supplements as recommended by your health care provider. What happens during an annual well check? The services and screenings done by your health care provider during your annual well check will depend on your age, overall health, lifestyle risk factors, and family history of disease. Counseling  Your health care provider may ask you questions about your:  Alcohol use.  Tobacco use.  Drug use.   Emotional well-being.  Home and relationship well-being.  Sexual activity.  Eating habits.  History of falls.  Memory and ability to understand (cognition).  Work and work Statistician. Screening  You may have the following tests or measurements:  Height, weight, and BMI.  Blood pressure.  Lipid and cholesterol levels. These may be checked every 5 years, or more frequently if you are over 76 years old.  Skin check.  Lung cancer screening. You may have this screening every year starting at age 76 if you have a 30-pack-year history of smoking and currently smoke or have quit within the past 15 years.  Fecal occult blood test (FOBT) of the stool. You may have this test every year starting at age 76.  Flexible sigmoidoscopy or colonoscopy. You may have a sigmoidoscopy every 5 years or a colonoscopy every 10 years starting at age 76.  Prostate cancer screening. Recommendations will vary depending on your family history and other risks.  Hepatitis C blood test.  Hepatitis B blood test.  Sexually transmitted disease (STD) testing.  Diabetes screening. This is done by checking your blood sugar (glucose) after you have not eaten for a while (fasting). You may have this done every 1-3 years.  Abdominal aortic aneurysm (AAA) screening. You may need this if you are a current or former smoker.  Osteoporosis. You may be screened starting at age 76 if you are at high risk. Talk with your health care provider about your test results, treatment options, and if necessary, the need for more tests. Vaccines  Your health care provider may recommend certain vaccines, such as:  Influenza vaccine. This is recommended every year.  Tetanus, diphtheria, and acellular pertussis (  Tdap, Td) vaccine. You may need a Td booster every 10 years.  Zoster vaccine. You may need this after age 76.  Pneumococcal 13-valent conjugate (PCV13) vaccine. One dose is recommended after age 76.  Pneumococcal  polysaccharide (PPSV23) vaccine. One dose is recommended after age 76. Talk to your health care provider about which screenings and vaccines you need and how often you need them. This information is not intended to replace advice given to you by your health care provider. Make sure you discuss any questions you have with your health care provider. Document Released: 07/18/2015 Document Revised: 03/10/2016 Document Reviewed: 04/22/2015 Elsevier Interactive Patient Education  2017 [employer] Prevention in the Home Falls can cause injuries. They can happen to people of all ages. There are many things you can do to make your home safe and to help prevent falls. What can I do on the outside of my home?  Regularly fix the edges of walkways and driveways and fix any cracks.  Remove anything that might make you trip as you walk through a door, such as a raised step or threshold.  Trim any bushes or trees on the path to your home.  Use bright outdoor lighting.  Clear any walking paths of anything that might make someone trip, such as rocks or tools.  Regularly check to see if handrails are loose or broken. Make sure that both sides of any steps have handrails.  Any raised decks and porches should have guardrails on the edges.  Have any leaves, snow, or ice cleared regularly.  Use sand or salt on walking paths during winter.  Clean up any spills in your garage right away. This includes oil or grease spills. What can I do in the bathroom?  Use night lights.  Install grab bars by the toilet and in the tub and shower. Do not use towel bars as grab bars.  Use non-skid mats or decals in the tub or shower.  If you need to sit down in the shower, use a plastic, non-slip stool.  Keep the floor dry. Clean up any water that spills on the floor as soon as it happens.  Remove soap buildup in the tub or shower regularly.  Attach bath mats securely with double-sided non-slip rug tape.   Do not have throw rugs and other things on the floor that can make you trip. What can I do in the bedroom?  Use night lights.  Make sure that you have a light by your bed that is easy to reach.  Do not use any sheets or blankets that are too big for your bed. They should not hang down onto the floor.  Have a firm chair that has side arms. You can use this for support while you get dressed.  Do not have throw rugs and other things on the floor that can make you trip. What can I do in the kitchen?  Clean up any spills right away.  Avoid walking on wet floors.  Keep items that you use a lot in easy-to-reach places.  If you need to reach something above you, use a strong step stool that has a grab bar.  Keep electrical cords out of the way.  Do not use floor polish or wax that makes floors slippery. If you must use wax, use non-skid floor wax.  Do not have throw rugs and other things on the floor that can make you trip. What can I do with my stairs?  Do  not leave any items on the stairs.  Make sure that there are handrails on both sides of the stairs and use them. Fix handrails that are broken or loose. Make sure that handrails are as long as the stairways.  Check any carpeting to make sure that it is firmly attached to the stairs. Fix any carpet that is loose or worn.  Avoid having throw rugs at the top or bottom of the stairs. If you do have throw rugs, attach them to the floor with carpet tape.  Make sure that you have a light switch at the top of the stairs and the bottom of the stairs. If you do not have them, ask someone to add them for you. What else can I do to help prevent falls?  Wear shoes that:  Do not have high heels.  Have rubber bottoms.  Are comfortable and fit you well.  Are closed at the toe. Do not wear sandals.  If you use a stepladder:  Make sure that it is fully opened. Do not climb a closed stepladder.  Make sure that both sides of the  stepladder are locked into place.  Ask someone to hold it for you, if possible.  Clearly mark and make sure that you can see:  Any grab bars or handrails.  First and last steps.  Where the edge of each step is.  Use tools that help you move around (mobility aids) if they are needed. These include:  Canes.  Walkers.  Scooters.  Crutches.  Turn on the lights when you go into a dark area. Replace any light bulbs as soon as they burn out.  Set up your furniture so you have a clear path. Avoid moving your furniture around.  If any of your floors are uneven, fix them.  If there are any pets around you, be aware of where they are.  Review your medicines with your doctor. Some medicines can make you feel dizzy. This can increase your chance of falling. Ask your doctor what other things that you can do to help prevent falls. This information is not intended to replace advice given to you by your health care provider. Make sure you discuss any questions you have with your health care provider. Document Released: 04/17/2009 Document Revised: 11/27/2015 Document Reviewed: 07/26/2014 Elsevier Interactive Patient Education  2017 [employer].

## 2019-05-09 NOTE — Progress Notes (Signed)
PCP notes:  Health Maintenance:  Declined colonoscopy at this time  Patient states that he has received the Shingrix vaccine. He will check his records.    Abnormal Screenings: none    Patient concerns: none    Nurse concerns: none   Next PCP appt.: 05/15/2019 @ 8:20 am

## 2019-05-15 ENCOUNTER — Encounter: Payer: Self-pay | Admitting: Primary Care

## 2019-05-15 ENCOUNTER — Other Ambulatory Visit: Payer: Self-pay

## 2019-05-15 ENCOUNTER — Encounter: Payer: Medicare Other | Admitting: Primary Care

## 2019-05-15 ENCOUNTER — Ambulatory Visit (INDEPENDENT_AMBULATORY_CARE_PROVIDER_SITE_OTHER): Payer: Medicare Other | Admitting: Primary Care

## 2019-05-15 VITALS — BP 122/66 | HR 78 | Temp 97.3°F | Ht 70.0 in | Wt 231.8 lb

## 2019-05-15 DIAGNOSIS — Z0000 Encounter for general adult medical examination without abnormal findings: Secondary | ICD-10-CM | POA: Diagnosis not present

## 2019-05-15 DIAGNOSIS — R972 Elevated prostate specific antigen [PSA]: Secondary | ICD-10-CM | POA: Diagnosis not present

## 2019-05-15 DIAGNOSIS — I872 Venous insufficiency (chronic) (peripheral): Secondary | ICD-10-CM | POA: Diagnosis not present

## 2019-05-15 DIAGNOSIS — Z23 Encounter for immunization: Secondary | ICD-10-CM

## 2019-05-15 DIAGNOSIS — N401 Enlarged prostate with lower urinary tract symptoms: Secondary | ICD-10-CM

## 2019-05-15 MED ORDER — ZOSTER VAC RECOMB ADJUVANTED 50 MCG/0.5ML IM SUSR: 0.5000 mL | Freq: Once | INTRAMUSCULAR | 1 refills | Status: AC

## 2019-05-15 NOTE — Assessment & Plan Note (Signed)
Doing well overall, compliant to compression socks. Following with vascular surgery.

## 2019-05-15 NOTE — Assessment & Plan Note (Signed)
Recent PSA of 5.47, following with Urology and has an appointment in December 2020.

## 2019-05-15 NOTE — Assessment & Plan Note (Signed)
Immunizations UTD. PSA UTD. Colon cancer screening due, he will call to schedule. Encouraged regular exercise and healthy diet. Exam today stable. Labs reviewed.  Follow up in 1 year for CPE.

## 2019-05-15 NOTE — Assessment & Plan Note (Signed)
Following with Urology, doing better on Flomax overall. Recent PSA stable.

## 2019-05-15 NOTE — Progress Notes (Signed)
Subjective:    Patient ID: Cory Parker, male    DOB: 08-16-1940, 78 y.o.   MRN: [id number]  HPI  Cory Parker is a 78 year old male who presents today for complete physical.  Immunizations: -Tetanus: Completed in 2015 -Influenza: Completed this season  -Shingles: Completed in 2013 -Pneumonia:  Completed in 2011, 2014, 2015  Diet: He endorses a fair. He is eating both home cooked meals, take out food. Desserts 1-2 times weekly. Drinking mostly water, beer and soda several times weekly.  Exercise: He is not exercising, walking at work.  Eye exam: Completed in 2020 Dental exam: Completes semi-annually   Colonoscopy: Completed in 2010, repeat exam not recommended. He received an e-mail for reminder repeat colonoscopy through [facility]. He will schedule.  PSA: 5.47 in 2020, 5.59 in 2019, 5.44 in 2018. Following with Urology.   BP Readings from Last 3 Encounters:  05/15/19 122/66  01/03/19 (!) 99/55  05/09/18 124/74     Review of Systems  Constitutional: Negative for unexpected weight change.  HENT: Negative for rhinorrhea.   Respiratory: Negative for cough and shortness of breath.   Cardiovascular: Negative for chest pain.       Chronic lower extremity edema, stable.  Gastrointestinal: Negative for constipation and diarrhea.  Genitourinary: Negative for difficulty urinating.  Musculoskeletal: Negative for arthralgias.  Skin: Negative for rash.  Allergic/Immunologic: Negative for environmental allergies.  Neurological: Negative for dizziness, numbness and headaches.  Psychiatric/Behavioral: The patient is not nervous/anxious.        Past Medical History:  Diagnosis Date  . Varicose veins      Social History   Socioeconomic History  . Marital status: Married    Spouse name: Not on file  . Number of children: Not on file  . Years of education: Not on file  . Highest education level: Not on file  Occupational History  . Not on file  Social Needs  . Financial  resource strain: Not hard at all  . Food insecurity    Worry: Never true    Inability: Never true  . Transportation needs    Medical: No    Non-medical: No  Tobacco Use  . Smoking status: Never Smoker  . Smokeless tobacco: Never Used  Substance and Sexual Activity  . Alcohol use: Yes    Alcohol/week: 1.0 - 2.0 standard drinks    Types: 1 - 2 Cans of beer per week  . Drug use: No  . Sexual activity: Not on file  Lifestyle  . Physical activity    Days per week: 0 days    Minutes per session: 0 min  . Stress: Not at all  Relationships  . Social Herbalist on phone: Not on file    Gets together: Not on file    Attends religious service: Not on file    Active member of club or organization: Not on file    Attends meetings of clubs or organizations: Not on file    Relationship status: Not on file  . Intimate partner violence    Fear of current or ex partner: No    Emotionally abused: No    Physically abused: No    Forced sexual activity: No  Other Topics Concern  . Not on file  Social History Narrative  . Not on file    Past Surgical History:  Procedure Laterality Date  . APPENDECTOMY    . finger torn tendon laceration    . TONSILLECTOMY  Family History  Problem Relation Age of Onset  . Heart attack Father   . Kidney disease Mother   . Heart disease Mother     Allergies  Allergen Reactions  . Codeine     Current Outpatient Medications on File Prior to Visit  Medication Sig Dispense Refill  . aspirin 81 MG tablet Take 81 mg by mouth daily.      . calcium-vitamin D (OSCAL WITH D) 500-200 MG-UNIT per tablet Take 1 tablet by mouth daily.      . cholecalciferol (VITAMIN D) 400 units TABS tablet Take 400 Units by mouth.    . DAILY MULTIPLE VITAMINS PO Take by mouth daily.      . Folic Acid 20 MG CAPS Take by mouth daily.      . tamsulosin (FLOMAX) 0.4 MG CAPS capsule     . vitamin C (ASCORBIC ACID) 500 MG tablet Take 500 mg by mouth daily.        No current facility-administered medications on file prior to visit.     BP 122/66   Pulse 78   Temp (!) 97.3 F (36.3 C) (Temporal)   Ht 5\' 10"  (1.778 m)   Wt 231 lb 12 oz (105.1 kg)   SpO2 97%   BMI 33.25 kg/m    Objective:   Physical Exam  Constitutional: He is oriented to person, place, and time. He appears well-nourished.  HENT:  Right Ear: Tympanic membrane and ear canal normal.  Left Ear: Tympanic membrane and ear canal normal.  Mouth/Throat: Oropharynx is clear and moist.  Eyes: Pupils are equal, round, and reactive to light. EOM are normal.  Neck: Neck supple.  Cardiovascular: Normal rate and regular rhythm.  Chronic lower extremity edema, stable  Respiratory: Effort normal and breath sounds normal.  GI: Soft. Bowel sounds are normal. There is no abdominal tenderness.  Musculoskeletal: Normal range of motion.  Neurological: He is alert and oriented to person, place, and time.  Skin: Skin is warm and dry.  Psychiatric: He has a normal mood and affect.           Assessment & Plan:

## 2019-05-15 NOTE — Patient Instructions (Signed)
Take the shingles vaccination to your pharmacy.  Start exercising. You should be getting 150 minutes of exercise weekly.  It's important to improve your diet by reducing consumption of fast food, fried food, processed snack foods, sugary drinks. Increase consumption of fresh vegetables and fruits, whole grains, water.  Ensure you are drinking 64 ounces of water daily.  It was a pleasure to see you today!   Preventive Care 11 Years and Older, Male Preventive care refers to lifestyle choices and visits with your health care provider that can promote health and wellness. This includes:  A yearly physical exam. This is also called an annual well check.  Regular dental and eye exams.  Immunizations.  Screening for certain conditions.  Healthy lifestyle choices, such as diet and exercise. What can I expect for my preventive care visit? Physical exam Your health care provider will check:  Height and weight. These may be used to calculate body mass index (BMI), which is a measurement that tells if you are at a healthy weight.  Heart rate and blood pressure.  Your skin for abnormal spots. Counseling Your health care provider may ask you questions about:  Alcohol, tobacco, and drug use.  Emotional well-being.  Home and relationship well-being.  Sexual activity.  Eating habits.  History of falls.  Memory and ability to understand (cognition).  Work and work Statistician. What immunizations do I need?  Influenza (flu) vaccine  This is recommended every year. Tetanus, diphtheria, and pertussis (Tdap) vaccine  You may need a Td booster every 10 years. Varicella (chickenpox) vaccine  You may need this vaccine if you have not already been vaccinated. Zoster (shingles) vaccine  You may need this after age 76. Pneumococcal conjugate (PCV13) vaccine  One dose is recommended after age 76. Pneumococcal polysaccharide (PPSV23) vaccine  One dose is recommended after age  76. Measles, mumps, and rubella (MMR) vaccine  You may need at least one dose of MMR if you were born in 1957 or later. You may also need a second dose. Meningococcal conjugate (MenACWY) vaccine  You may need this if you have certain conditions. Hepatitis A vaccine  You may need this if you have certain conditions or if you travel or work in places where you may be exposed to hepatitis A. Hepatitis B vaccine  You may need this if you have certain conditions or if you travel or work in places where you may be exposed to hepatitis B. Haemophilus influenzae type b (Hib) vaccine  You may need this if you have certain conditions. You may receive vaccines as individual doses or as more than one vaccine together in one shot (combination vaccines). Talk with your health care provider about the risks and benefits of combination vaccines. What tests do I need? Blood tests  Lipid and cholesterol levels. These may be checked every 5 years, or more frequently depending on your overall health.  Hepatitis C test.  Hepatitis B test. Screening  Lung cancer screening. You may have this screening every year starting at age 76 if you have a 30-pack-year history of smoking and currently smoke or have quit within the past 15 years.  Colorectal cancer screening. All adults should have this screening starting at age 76 and continuing until age 76. Your health care provider may recommend screening at age 76 if you are at increased risk. You will have tests every 1-10 years, depending on your results and the type of screening test.  Prostate cancer screening. Recommendations will vary depending  on your family history and other risks.  Diabetes screening. This is done by checking your blood sugar (glucose) after you have not eaten for a while (fasting). You may have this done every 1-3 years.  Abdominal aortic aneurysm (AAA) screening. You may need this if you are a current or former smoker.  Sexually  transmitted disease (STD) testing. Follow these instructions at home: Eating and drinking  Eat a diet that includes fresh fruits and vegetables, whole grains, lean protein, and low-fat dairy products. Limit your intake of foods with high amounts of sugar, saturated fats, and salt.  Take vitamin and mineral supplements as recommended by your health care provider.  Do not drink alcohol if your health care provider tells you not to drink.  If you drink alcohol: ? Limit how much you have to 0-2 drinks a day. ? Be aware of how much alcohol is in your drink. In the [place] one drink equals one 12 oz bottle of beer (355 mL), one 5 oz glass of wine (148 mL), or one 1 oz glass of hard liquor (44 mL). Lifestyle  Take daily care of your teeth and gums.  Stay active. Exercise for at least 30 minutes on 5 or more days each week.  Do not use any products that contain nicotine or tobacco, such as cigarettes, e-cigarettes, and chewing tobacco. If you need help quitting, ask your health care provider.  If you are sexually active, practice safe sex. Use a condom or other form of protection to prevent STIs (sexually transmitted infections).  Talk with your health care provider about taking a low-dose aspirin or statin. What's next?  Visit your health care provider once a year for a well check visit.  Ask your health care provider how often you should have your eyes and teeth checked.  Stay up to date on all vaccines. This information is not intended to replace advice given to you by your health care provider. Make sure you discuss any questions you have with your health care provider. Document Released: 07/18/2015 Document Revised: 06/15/2018 Document Reviewed: 06/15/2018 Elsevier Patient Education  2020 [employer].

## 2019-07-30 ENCOUNTER — Encounter: Payer: Self-pay | Admitting: Primary Care

## 2019-07-30 ENCOUNTER — Telehealth (INDEPENDENT_AMBULATORY_CARE_PROVIDER_SITE_OTHER): Payer: Medicare Other | Admitting: Primary Care

## 2019-07-30 VITALS — BP 138/74 | HR 81

## 2019-07-30 DIAGNOSIS — L539 Erythematous condition, unspecified: Secondary | ICD-10-CM

## 2019-07-30 HISTORY — DX: Erythematous condition, unspecified: L53.9

## 2019-07-30 NOTE — Patient Instructions (Addendum)
Please call Dr. Doren Custard as discussed.   Call the main line to schedule a lab appointment.  You will be contacted regarding your ultrasound.  Please let us know if you have not been contacted within two days.   It was a pleasure to see you today! Allie Bossier, NP-C

## 2019-07-30 NOTE — Assessment & Plan Note (Addendum)
Acute with rash and mild swelling. Also with low grade fevers.  Differentials include vascular cause (PAD/PVD), DVT, cellulitis. Doesn't appear to be shingles, acute dermatitis.  Check CBC with Diff to rule out cellulitis. Check venous ultrasound to rule out DVT.  He will also call his vascular surgeon to discuss.

## 2019-07-30 NOTE — Progress Notes (Signed)
Subjective:    Patient ID: Cory Parker, male    DOB: 03/27/1941, 79 y.o.   MRN: [id number]  HPI  Virtual Visit via Video Note  I connected with Cory Parker on 07/30/19 at  2:20 PM EST by a video enabled telemedicine application and verified that I am speaking with the correct person using two identifiers.  Location: Patient: Home Provider: Office   I discussed the limitations of evaluation and management by telemedicine and the availability of in person appointments. The patient expressed understanding and agreed to proceed.  History of Present Illness:  Mr. Cory Parker is a 79 year old male with a history of venous insufficiency, laser ablation of great saphenous vein, BPH  His rash is located to the right lower extremity distal to the knee and proximal to the ankle that he first noticed yesterday. He's noticed mild itching but overall not bothersome. He's been walking 2 miles every weekend, walked Saturday and Sunday (prior two days).   He denies changes in soaps, detergents, food, medications, recent known exposure to Covid-19. He also endorses fevers of 99.0-100 with reduction with Tylenol. His last fever was 1 pm today, no use of Tylenol today.   Over the last several months he's noticed erythema to the right lower extremity and bilateral ankle swelling during the day. He is compliant to his compression socks. He denies long travel, does sit during the day but tries to get up often.   Observations/Objective:  Alert and oriented. Appears well, not sickly. No distress. Speaking in complete sentences.  Rash noted to right lateral lower extremity, difficult to see via video today. He did attach pictures to My Chart. Area of erythema with red flat bumps. No obvious swelling. No wheals, hives, vesicles.   Assessment and Plan:  See problem based charting.  Follow Up Instructions:  Please call Dr. Doren Custard as discussed.   Call the main line to schedule a lab  appointment.  You will be contacted regarding your ultrasound.  Please let us know if you have not been contacted within two days.   It was a pleasure to see you today! Allie Bossier, NP-C    I discussed the assessment and treatment plan with the patient. The patient was provided an opportunity to ask questions and all were answered. The patient agreed with the plan and demonstrated an understanding of the instructions.   The patient was advised to call back or seek an in-person evaluation if the symptoms worsen or if the condition fails to improve as anticipated.   [name], NP    Review of Systems  Constitutional: Positive for fever. Negative for chills.  HENT: Negative for congestion.   Respiratory: Negative for cough and shortness of breath.   Cardiovascular: Negative for chest pain.  Skin: Positive for color change. Negative for wound.       Past Medical History:  Diagnosis Date  . Varicose veins      Social History   Socioeconomic History  . Marital status: Married    Spouse name: Not on file  . Number of children: Not on file  . Years of education: Not on file  . Highest education level: Not on file  Occupational History  . Not on file  Tobacco Use  . Smoking status: Never Smoker  . Smokeless tobacco: Never Used  Substance and Sexual Activity  . Alcohol use: Yes    Alcohol/week: 1.0 - 2.0 standard drinks    Types: 1 - 2  Cans of beer per week  . Drug use: No  . Sexual activity: Not on file  Other Topics Concern  . Not on file  Social History Narrative  . Not on file   Social Determinants of Health   Financial Resource Strain: Low Risk   . Difficulty of Paying Living Expenses: Not hard at all  Food Insecurity: No Food Insecurity  . Worried About Charity fundraiser in the Last Year: Never true  . Ran Out of Food in the Last Year: Never true  Transportation Needs: No Transportation Needs  . Lack of Transportation (Medical): No  . Lack of  Transportation (Non-Medical): No  Physical Activity: Inactive  . Days of Exercise per Week: 0 days  . Minutes of Exercise per Session: 0 min  Stress: No Stress Concern Present  . Feeling of Stress : Not at all  Social Connections:   . Frequency of Communication with Friends and Family: Not on file  . Frequency of Social Gatherings with Friends and Family: Not on file  . Attends Religious Services: Not on file  . Active Member of Clubs or Organizations: Not on file  . Attends Archivist Meetings: Not on file  . Marital Status: Not on file  Intimate Partner Violence: Not At Risk  . Fear of Current or Ex-Partner: No  . Emotionally Abused: No  . Physically Abused: No  . Sexually Abused: No    Past Surgical History:  Procedure Laterality Date  . APPENDECTOMY    . finger torn tendon laceration    . TONSILLECTOMY      Family History  Problem Relation Age of Onset  . Heart attack Father   . Kidney disease Mother   . Heart disease Mother     Allergies  Allergen Reactions  . Codeine     Current Outpatient Medications on File Prior to Visit  Medication Sig Dispense Refill  . aspirin 81 MG tablet Take 81 mg by mouth daily.      . calcium-vitamin D (OSCAL WITH D) 500-200 MG-UNIT per tablet Take 1 tablet by mouth daily.      . cholecalciferol (VITAMIN D) 400 units TABS tablet Take 400 Units by mouth.    . DAILY MULTIPLE VITAMINS PO Take by mouth daily.      . Folic Acid 20 MG CAPS Take by mouth daily.      . tamsulosin (FLOMAX) 0.4 MG CAPS capsule     . vitamin C (ASCORBIC ACID) 500 MG tablet Take 500 mg by mouth daily.       No current facility-administered medications on file prior to visit.    BP 138/74   Pulse 81    Objective:   Physical Exam  Constitutional: He is oriented to person, place, and time. He appears well-nourished.  Respiratory: Effort normal.  Neurological: He is alert and oriented to person, place, and time.  Skin: Rash noted. There is  erythema.  Rash noted to right lateral lower extremity, difficult to see via video today. He did attach pictures to My Chart. Area of erythema with red flat bumps. No obvious swelling. No wheals, hives, vesicles.   Psychiatric: He has a normal mood and affect.           Assessment & Plan:

## 2019-07-31 ENCOUNTER — Other Ambulatory Visit: Payer: Self-pay

## 2019-07-31 ENCOUNTER — Ambulatory Visit
Admission: RE | Admit: 2019-07-31 | Discharge: 2019-07-31 | Disposition: A | Discharge status: Home / Self Care | Payer: Medicare Other | Source: Ambulatory Visit | Attending: Primary Care | Admitting: Primary Care

## 2019-07-31 ENCOUNTER — Other Ambulatory Visit: Payer: Self-pay | Admitting: Primary Care

## 2019-07-31 ENCOUNTER — Other Ambulatory Visit (INDEPENDENT_AMBULATORY_CARE_PROVIDER_SITE_OTHER): Payer: Medicare Other

## 2019-07-31 ENCOUNTER — Telehealth: Payer: Self-pay | Admitting: Primary Care

## 2019-07-31 DIAGNOSIS — L539 Erythematous condition, unspecified: Secondary | ICD-10-CM | POA: Diagnosis present

## 2019-07-31 DIAGNOSIS — R600 Localized edema: Secondary | ICD-10-CM

## 2019-07-31 LAB — CBC WITH DIFFERENTIAL/PLATELET
Basophils Absolute: 0.1 10*3/uL (ref 0.0–0.1)
Basophils Relative: 0.8 % (ref 0.0–3.0)
Eosinophils Absolute: 0.1 10*3/uL (ref 0.0–0.7)
Eosinophils Relative: 1.9 % (ref 0.0–5.0)
HCT: 45.9 % (ref 39.0–52.0)
Hemoglobin: 15.8 g/dL (ref 13.0–17.0)
Lymphocytes Relative: 29.1 % (ref 12.0–46.0)
Lymphs Abs: 2 10*3/uL (ref 0.7–4.0)
MCHC: 34.4 g/dL (ref 30.0–36.0)
MCV: 92.3 fl (ref 78.0–100.0)
Monocytes Absolute: 0.6 10*3/uL (ref 0.1–1.0)
Monocytes Relative: 9.2 % (ref 3.0–12.0)
Neutro Abs: 4.1 10*3/uL (ref 1.4–7.7)
Neutrophils Relative %: 59 % (ref 43.0–77.0)
Platelets: 160 10*3/uL (ref 150.0–400.0)
RBC: 4.97 Mil/uL (ref 4.22–5.81)
RDW: 13.3 % (ref 11.5–15.5)
WBC: 7 10*3/uL (ref 4.0–10.5)

## 2019-07-31 MED ORDER — CEPHALEXIN 500 MG PO CAPS: 500.0000 mg | ORAL_CAPSULE | Freq: Three times a day (TID) | ORAL | 0 refills | Status: DC

## 2019-07-31 NOTE — Telephone Encounter (Signed)
Spoke with patient via phone regarding symptoms. Lower extremity redness continues, now with warmth. He endorses scratching his leg last week, has noticed residual abrasions.  Negative [place] for DVT. Cbc unremarkable.  Given new findings of warmth with continued erythema we will treat with cephalexin course. He agreed and verbalized understanding. He will update in 3 days.

## 2019-08-06 NOTE — Telephone Encounter (Signed)
Spoke with patient via phone just now, symptoms are improving. He will also touch base again with his vascular doctor, he sent pictures this morning. He will also update again tomorrow.

## 2019-08-07 ENCOUNTER — Encounter: Payer: Self-pay | Admitting: Primary Care

## 2019-08-07 ENCOUNTER — Ambulatory Visit (INDEPENDENT_AMBULATORY_CARE_PROVIDER_SITE_OTHER): Payer: Medicare Other | Admitting: Primary Care

## 2019-08-07 ENCOUNTER — Other Ambulatory Visit: Payer: Self-pay

## 2019-08-07 VITALS — BP 120/72 | HR 66 | Temp 96.9°F | Ht 70.0 in | Wt 243.5 lb

## 2019-08-07 DIAGNOSIS — Z8249 Family history of ischemic heart disease and other diseases of the circulatory system: Secondary | ICD-10-CM

## 2019-08-07 DIAGNOSIS — I872 Venous insufficiency (chronic) (peripheral): Secondary | ICD-10-CM | POA: Diagnosis not present

## 2019-08-07 DIAGNOSIS — L539 Erythematous condition, unspecified: Secondary | ICD-10-CM | POA: Diagnosis not present

## 2019-08-07 NOTE — Assessment & Plan Note (Signed)
In both parents. Referral placed to cardiology as requested.

## 2019-08-07 NOTE — Telephone Encounter (Signed)
Please schedule him for office visit for today.

## 2019-08-07 NOTE — Assessment & Plan Note (Signed)
Significant improvement since cephalexin course, hardly any rash remaining. Continue elevation, compression hose, etc. He will update with any changes.

## 2019-08-07 NOTE — Progress Notes (Signed)
Subjective:    Patient ID: Cory Parker, male    DOB: Dec 14, 1940, 79 y.o.   MRN: [id number]  HPI  This visit occurred during the SARS-CoV-2 public health emergency.  Safety protocols were in place, including screening questions prior to the visit, additional usage of staff PPE, and extensive cleaning of exam room while observing appropriate contact time as indicated for disinfecting solutions.   Cory Parker is a 79 year old male with a history of venous insufficiency, BPH who presents today for follow up of lower extremity erythema. He would also like a cardiology evaluation.   1) Low Extremity Erythema: He was initially evaluated virtually last week for complaints of erythema, rash, and warmth to right lower extremity. He was sent to [facility] for DVT evaluation for which was negative. Given warmth and swelling he was treated with a cephalexin course for presumed cellulitis. Etiology of rash and symptoms was not clear. He updated [place] yesterday and today, some improvement but without resolve. He was asked to come in today.  Today he endorses that over the last several days he's noticed reduction in erythema, rash, swelling. He is compliant to his cephalexin, compression hose. He denies fevers. Overall he's doing better.   2) Family History of Heart Disease/Venous Insufficiency: History of with both parents. He has undergone cardiology evaluation years ago including stress test, all of which was negative. He is wanting to get back to regular exercise but is wanting cardiology evaluation to ensure he's stable enough to do so. He denies exertional chest pain, shortness of breath, dizziness, headaches.   BP Readings from Last 3 Encounters:  08/07/19 120/72  07/30/19 138/74  05/15/19 122/66     Review of Systems  Respiratory: Negative for shortness of breath.   Cardiovascular: Negative for chest pain.  Neurological: Negative for dizziness and headaches.       Past Medical History:  Diagnosis  Date  . Varicose veins      Social History   Socioeconomic History  . Marital status: Married    Spouse name: Not on file  . Number of children: Not on file  . Years of education: Not on file  . Highest education level: Not on file  Occupational History  . Not on file  Tobacco Use  . Smoking status: Never Smoker  . Smokeless tobacco: Never Used  Substance and Sexual Activity  . Alcohol use: Yes    Alcohol/week: 1.0 - 2.0 standard drinks    Types: 1 - 2 Cans of beer per week  . Drug use: No  . Sexual activity: Not on file  Other Topics Concern  . Not on file  Social History Narrative  . Not on file   Social Determinants of Health   Financial Resource Strain: Low Risk   . Difficulty of Paying Living Expenses: Not hard at all  Food Insecurity: No Food Insecurity  . Worried About Charity fundraiser in the Last Year: Never true  . Ran Out of Food in the Last Year: Never true  Transportation Needs: No Transportation Needs  . Lack of Transportation (Medical): No  . Lack of Transportation (Non-Medical): No  Physical Activity: Inactive  . Days of Exercise per Week: 0 days  . Minutes of Exercise per Session: 0 min  Stress: No Stress Concern Present  . Feeling of Stress : Not at all  Social Connections:   . Frequency of Communication with Friends and Family: Not on file  . Frequency of Social  Gatherings with Friends and Family: Not on file  . Attends Religious Services: Not on file  . Active Member of Clubs or Organizations: Not on file  . Attends Archivist Meetings: Not on file  . Marital Status: Not on file  Intimate Partner Violence: Not At Risk  . Fear of Current or Ex-Partner: No  . Emotionally Abused: No  . Physically Abused: No  . Sexually Abused: No    Past Surgical History:  Procedure Laterality Date  . APPENDECTOMY    . finger torn tendon laceration    . TONSILLECTOMY      Family History  Problem Relation Age of Onset  . Heart attack Father    . Kidney disease Mother   . Heart disease Mother     Allergies  Allergen Reactions  . Codeine     Current Outpatient Medications on File Prior to Visit  Medication Sig Dispense Refill  . aspirin 81 MG tablet Take 81 mg by mouth daily.      . calcium-vitamin D (OSCAL WITH D) 500-200 MG-UNIT per tablet Take 1 tablet by mouth daily.      . cholecalciferol (VITAMIN D) 400 units TABS tablet Take 400 Units by mouth.    . DAILY MULTIPLE VITAMINS PO Take by mouth daily.      . Folic Acid 20 MG CAPS Take by mouth daily.      . tamsulosin (FLOMAX) 0.4 MG CAPS capsule     . vitamin C (ASCORBIC ACID) 500 MG tablet Take 500 mg by mouth daily.       No current facility-administered medications on file prior to visit.    BP 120/72   Pulse 66   Temp (!) 96.9 F (36.1 C) (Temporal)   Ht 5\' 10"  (1.778 m)   Wt 243 lb 8 oz (110.5 kg)   SpO2 96%   BMI 34.94 kg/m    Objective:   Physical Exam  Constitutional: He appears well-nourished.  Cardiovascular: Normal rate and regular rhythm.  Pulses:      Dorsalis pedis pulses are 2+ on the right side.       Posterior tibial pulses are 2+ on the right side.  Moderate lower extremity edema noted bilaterally without pitting.  Respiratory: Effort normal and breath sounds normal.  Musculoskeletal:     Cervical back: Neck supple.  Skin: Skin is warm and dry.  Very slight erythema remaining to right lower extremity, much improved from initial visit.  Psychiatric: He has a normal mood and affect.           Assessment & Plan:

## 2019-08-07 NOTE — Patient Instructions (Signed)
You will be contacted regarding your referral to cardiology.  Please let us know if you have not been contacted within two weeks.   Please update me with any changes in your leg.  It was a pleasure to see you today!

## 2019-08-14 ENCOUNTER — Ambulatory Visit: Payer: Medicare Other | Admitting: Cardiology

## 2019-09-12 ENCOUNTER — Other Ambulatory Visit: Payer: Self-pay | Admitting: *Deleted

## 2019-09-12 DIAGNOSIS — I83811 Varicose veins of right lower extremities with pain: Secondary | ICD-10-CM

## 2019-09-13 ENCOUNTER — Ambulatory Visit (INDEPENDENT_AMBULATORY_CARE_PROVIDER_SITE_OTHER): Payer: Medicare Other | Admitting: Vascular Surgery

## 2019-09-13 ENCOUNTER — Encounter: Payer: Self-pay | Admitting: Vascular Surgery

## 2019-09-13 ENCOUNTER — Other Ambulatory Visit: Payer: Self-pay

## 2019-09-13 ENCOUNTER — Ambulatory Visit (HOSPITAL_COMMUNITY)
Admission: RE | Admit: 2019-09-13 | Discharge: 2019-09-13 | Disposition: A | Discharge status: Home / Self Care | Payer: Medicare Other | Source: Ambulatory Visit | Attending: Vascular Surgery | Admitting: Vascular Surgery

## 2019-09-13 VITALS — BP 126/73 | HR 88 | Temp 97.8°F | Resp 18 | Ht 70.0 in | Wt 240.3 lb

## 2019-09-13 DIAGNOSIS — I872 Venous insufficiency (chronic) (peripheral): Secondary | ICD-10-CM | POA: Diagnosis not present

## 2019-09-13 DIAGNOSIS — I83811 Varicose veins of right lower extremities with pain: Secondary | ICD-10-CM

## 2019-09-13 NOTE — Progress Notes (Signed)
Patient name: Cory Parker MRN: [id number] DOB: [birth year] Sex: male  REASON FOR VISIT:   Follow-up of venous disease.  HPI:   Cory Parker is a pleasant 79 y.o. male who I had last seen in July 2020.  He had a history of left lower extremity swelling and venous insufficiency.  At that time he had a venous reflux study from 2019 which showed significant reflux in the left great saphenous vein which was significantly dilated.  He had CEAP C4 venous disease.  We discussed the importance of intermittent leg elevation.  I encouraged him to wear his thigh-high compression stockings.  Encouraged him to avoid prolonged sitting and standing.  We discussed the importance of exercise.  I felt that if his symptoms progressed on the left he would have to repeat his reflux study and consider him for laser ablation of the left great saphenous vein.  He comes in today and his chief complaint is he has had some intermittent episodes of cellulitis in the right leg and leg swelling.  His swelling is worse at the end of the day.  He describes some aching pain and heaviness in both legs but especially on the right.  He has been wearing knee-high compression stockings with a gradient of 15 to 20 mmHg and these do help.  Leg elevation also helps his symptoms.  He has had no previous history of DVT or phlebitis.  He has had previous laser ablation of the right great saphenous vein I believe by Dr. Kellie Simmering.  Past Medical History:  Diagnosis Date  . Varicose veins     Family History  Problem Relation Age of Onset  . Heart attack Father   . Kidney disease Mother   . Heart disease Mother     SOCIAL HISTORY: Social History   Tobacco Use  . Smoking status: Never Smoker  . Smokeless tobacco: Never Used  Substance Use Topics  . Alcohol use: Yes    Alcohol/week: 1.0 - 2.0 standard drinks    Types: 1 - 2 Cans of beer per week    Allergies  Allergen Reactions  . Codeine     Current Outpatient  Medications  Medication Sig Dispense Refill  . aspirin 81 MG tablet Take 81 mg by mouth daily.      . calcium-vitamin D (OSCAL WITH D) 500-200 MG-UNIT per tablet Take 1 tablet by mouth daily.      . cholecalciferol (VITAMIN D) 400 units TABS tablet Take 400 Units by mouth.    . DAILY MULTIPLE VITAMINS PO Take by mouth daily.      . Folic Acid 20 MG CAPS Take by mouth daily.      . tamsulosin (FLOMAX) 0.4 MG CAPS capsule     . vitamin C (ASCORBIC ACID) 500 MG tablet Take 500 mg by mouth daily.       No current facility-administered medications for this visit.    REVIEW OF SYSTEMS:  [X]  denotes positive finding, [ ]  denotes negative finding Cardiac  Comments:  Chest pain or chest pressure:    Shortness of breath upon exertion:    Short of breath when lying flat:    Irregular heart rhythm:        Vascular    Pain in calf, thigh, or hip brought on by ambulation:    Pain in feet at night that wakes you up from your sleep:     Blood clot in your veins:    Leg swelling:  x       Pulmonary    Oxygen at home:    Productive cough:     Wheezing:         Neurologic    Sudden weakness in arms or legs:     Sudden numbness in arms or legs:     Sudden onset of difficulty speaking or slurred speech:    Temporary loss of vision in one eye:     Problems with dizziness:         Gastrointestinal    Blood in stool:     Vomited blood:         Genitourinary    Burning when urinating:     Blood in urine:        Psychiatric    Major depression:         Hematologic    Bleeding problems:    Problems with blood clotting too easily:        Skin    Rashes or ulcers:        Constitutional    Fever or chills:     PHYSICAL EXAM:   Vitals:   09/13/19 1335  BP: 126/73  Pulse: 88  Resp: 18  Temp: 97.8 F (36.6 C)  TempSrc: Temporal  SpO2: 96%  Weight: 240 lb 4.8 oz (109 kg)  Height: 5\' 10"  (1.778 m)    GENERAL: The patient is a well-nourished male, in no acute distress. The  vital signs are documented above. CARDIAC: There is a regular rate and rhythm.  VASCULAR: I do not detect carotid bruits. He has palpable pedal pulses. He has mild bilateral lower extremity swelling. He has hyperpigmentation bilaterally. PULMONARY: There is good air exchange bilaterally without wheezing or rales. ABDOMEN: Soft and non-tender with normal pitched bowel sounds.  MUSCULOSKELETAL: There are no major deformities or cyanosis. NEUROLOGIC: No focal weakness or paresthesias are detected. SKIN: There are no ulcers or rashes noted.    PSYCHIATRIC: The patient has a normal affect.  DATA:    VENOUS DUPLEX: I have independently interpreted his venous duplex scan of the right lower extremity today.  There is no evidence of DVT.  There is no superficial venous thrombosis.  There is no deep venous reflux.  There is some reflux at the saphenofemoral junction.  The right great saphenous vein has previously been ablated.  There is a branch noted above the fascia which begins in the mid thigh and extends down to the calf.  MEDICAL ISSUES:   CHRONIC VENOUS INSUFFICIENCY: This patient has CEAP C4 venous disease.  On the right side based on his venous duplex scan the right great saphenous vein has been successfully ablated.  Although he has some reflux in the small saphenous vein the vein is not especially dilated.  We have again discussed the importance of intermittent leg elevation and the proper positioning for this.  I encouraged him to continue to wear his compression stockings.  I have encouraged him to avoid prolonged sitting and standing.  We discussed the importance of exercise specifically walking and water aerobics.  I explained that at this point he is not a candidate for further procedures on the right leg.  If he developed worsening symptoms on the left he might potentially a candidate for laser ablation of the left great saphenous vein.  He will call if his symptoms  progress.  Deitra Mayo [facility] [phone]

## 2019-09-19 DIAGNOSIS — Z1283 Encounter for screening for malignant neoplasm of skin: Secondary | ICD-10-CM | POA: Diagnosis not present

## 2019-09-19 DIAGNOSIS — C44722 Squamous cell carcinoma of skin of right lower limb, including hip: Secondary | ICD-10-CM | POA: Diagnosis not present

## 2019-09-19 DIAGNOSIS — L821 Other seborrheic keratosis: Secondary | ICD-10-CM | POA: Diagnosis not present

## 2019-09-19 DIAGNOSIS — X32XXXA Exposure to sunlight, initial encounter: Secondary | ICD-10-CM | POA: Diagnosis not present

## 2019-09-19 DIAGNOSIS — L570 Actinic keratosis: Secondary | ICD-10-CM | POA: Diagnosis not present

## 2019-10-24 DIAGNOSIS — Z08 Encounter for follow-up examination after completed treatment for malignant neoplasm: Secondary | ICD-10-CM | POA: Diagnosis not present

## 2019-10-24 DIAGNOSIS — Z85828 Personal history of other malignant neoplasm of skin: Secondary | ICD-10-CM | POA: Diagnosis not present

## 2019-12-14 DIAGNOSIS — R972 Elevated prostate specific antigen [PSA]: Secondary | ICD-10-CM

## 2019-12-14 DIAGNOSIS — N401 Enlarged prostate with lower urinary tract symptoms: Secondary | ICD-10-CM

## 2020-01-17 DIAGNOSIS — R3911 Hesitancy of micturition: Secondary | ICD-10-CM | POA: Diagnosis not present

## 2020-01-17 DIAGNOSIS — R350 Frequency of micturition: Secondary | ICD-10-CM | POA: Diagnosis not present

## 2020-02-08 NOTE — Telephone Encounter (Signed)
[facility] - Client TELEPHONE ADVICE RECORD [facility] Patient Name: Cory Parker Gender: Male DOB: Feb 03, 1941 Age: 79 Y 38 M 25 D Return Phone Number: [phone] (Primary), [phone] (Secondary) Address[place] [address] Client [name] - Client Client Site [place] - Day Physician Alma Friendly - NP Contact Type Call Who Is Calling Patient / Member / Family / Caregiver Call Type Triage / Clinical Relationship To Patient Self Return Phone Number [phone] (Primary) Chief Complaint Urination Pain Reason for Call Symptomatic / Request for [name] states he has been experiencing UTI symptoms and he would like a medication called in. Translation No Disp. Time Eilene Ghazi Time) Disposition Final User 02/08/2020 9:31:14 AM Clinical Call Hardin Negus, RNMardene Celeste 02/08/2020 9:31:34 AM Clinical Call Yes Hardin Negus, RN, Mardene Celeste

## 2020-02-08 NOTE — Telephone Encounter (Signed)
I spoke with pt; pt has appt with [facility] this afternoon at 2:15. Nothing further needed.

## 2020-02-09 ENCOUNTER — Other Ambulatory Visit: Payer: Self-pay

## 2020-02-09 ENCOUNTER — Emergency Department
Admission: EM | Admit: 2020-02-09 | Discharge: 2020-02-09 | Disposition: A | Discharge status: Home / Self Care | Payer: Medicare Other | Attending: Emergency Medicine | Admitting: Emergency Medicine

## 2020-02-09 DIAGNOSIS — Z5321 Procedure and treatment not carried out due to patient leaving prior to being seen by health care provider: Secondary | ICD-10-CM | POA: Diagnosis not present

## 2020-02-09 DIAGNOSIS — R339 Retention of urine, unspecified: Secondary | ICD-10-CM | POA: Diagnosis not present

## 2020-02-09 LAB — BASIC METABOLIC PANEL
Anion gap: 11 (ref 5–15)
BUN: 22 mg/dL (ref 8–23)
CO2: 22 mmol/L (ref 22–32)
Calcium: 8.8 mg/dL — ABNORMAL LOW (ref 8.9–10.3)
Chloride: 102 mmol/L (ref 98–111)
Creatinine, Ser: 1.46 mg/dL — ABNORMAL HIGH (ref 0.61–1.24)
GFR calc Af Amer: 53 mL/min — ABNORMAL LOW (ref >60)
GFR calc non Af Amer: 46 mL/min — ABNORMAL LOW (ref >60)
Glucose, Bld: 134 mg/dL — ABNORMAL HIGH (ref 70–99)
Potassium: 3.8 mmol/L (ref 3.5–5.1)
Sodium: 135 mmol/L (ref 135–145)

## 2020-02-09 LAB — CBC
HCT: 44.2 % (ref 39.0–52.0)
Hemoglobin: 16.1 g/dL (ref 13.0–17.0)
MCH: 31.9 pg (ref 26.0–34.0)
MCHC: 36.4 g/dL — ABNORMAL HIGH (ref 30.0–36.0)
MCV: 87.7 fL (ref 80.0–100.0)
Platelets: 206 10*3/uL (ref 150–400)
RBC: 5.04 MIL/uL (ref 4.22–5.81)
RDW: 12.6 % (ref 11.5–15.5)
WBC: 13 10*3/uL — ABNORMAL HIGH (ref 4.0–10.5)
nRBC: 0 % (ref 0.0–0.2)

## 2020-02-09 LAB — URINALYSIS, COMPLETE (UACMP) WITH MICROSCOPIC
Bilirubin Urine: NEGATIVE
Glucose, UA: NEGATIVE mg/dL
Ketones, ur: NEGATIVE mg/dL
Leukocytes,Ua: NEGATIVE
Nitrite: POSITIVE — AB
Protein, ur: NEGATIVE mg/dL
RBC / HPF: >50 RBC/hpf — ABNORMAL HIGH (ref 0–5)
Specific Gravity, Urine: 1.017 (ref 1.005–1.030)
Squamous Epithelial / HPF: NONE SEEN (ref 0–5)
pH: 5 (ref 5.0–8.0)

## 2020-02-09 NOTE — ED Notes (Signed)
Pt states he wants to leave due to wait time. Pt informed that he may need antibiotics and encouraged to stay and be seen. Pt declines. Pt states he will sign out against medical advice.

## 2020-02-09 NOTE — ED Notes (Signed)
Pt texting spouse trying to decide if he wants to sign out now or not.

## 2020-02-09 NOTE — ED Notes (Signed)
Verbal order to place foley catheter from Dr. Archie Balboa.   Pt ambulated to bathroom to defecate prior to foley insertion.

## 2020-02-09 NOTE — ED Triage Notes (Addendum)
Pt was at urologist yesterday for prostatitis, started on cipro. States hasn't urinated since 5am today. A&O, ambulatory. Takes tamsulosin.  Reports took 2 azo's this AM. Denies hx HTN. 199/114 and 196/113 in triage.

## 2020-02-12 DIAGNOSIS — R338 Other retention of urine: Secondary | ICD-10-CM | POA: Diagnosis not present

## 2020-02-15 DIAGNOSIS — R338 Other retention of urine: Secondary | ICD-10-CM | POA: Diagnosis not present

## 2020-02-16 ENCOUNTER — Emergency Department (HOSPITAL_COMMUNITY)
Admission: EM | Admit: 2020-02-16 | Discharge: 2020-02-16 | Disposition: A | Discharge status: Home / Self Care | Payer: Medicare Other | Attending: Emergency Medicine | Admitting: Emergency Medicine

## 2020-02-16 ENCOUNTER — Other Ambulatory Visit: Payer: Self-pay

## 2020-02-16 ENCOUNTER — Encounter (HOSPITAL_COMMUNITY): Payer: Self-pay

## 2020-02-16 DIAGNOSIS — Z7982 Long term (current) use of aspirin: Secondary | ICD-10-CM | POA: Insufficient documentation

## 2020-02-16 DIAGNOSIS — R339 Retention of urine, unspecified: Secondary | ICD-10-CM | POA: Diagnosis not present

## 2020-02-16 LAB — CBC
HCT: 42 % (ref 39.0–52.0)
Hemoglobin: 15.4 g/dL (ref 13.0–17.0)
MCH: 32 pg (ref 26.0–34.0)
MCHC: 36.7 g/dL — ABNORMAL HIGH (ref 30.0–36.0)
MCV: 87.3 fL (ref 80.0–100.0)
Platelets: 182 10*3/uL (ref 150–400)
RBC: 4.81 MIL/uL (ref 4.22–5.81)
RDW: 12.2 % (ref 11.5–15.5)
WBC: 9.5 10*3/uL (ref 4.0–10.5)
nRBC: 0 % (ref 0.0–0.2)

## 2020-02-16 LAB — URINALYSIS, ROUTINE W REFLEX MICROSCOPIC
Bacteria, UA: NONE SEEN
Bilirubin Urine: NEGATIVE
Glucose, UA: NEGATIVE mg/dL
Ketones, ur: 5 mg/dL — AB
Leukocytes,Ua: NEGATIVE
Nitrite: POSITIVE — AB
Protein, ur: NEGATIVE mg/dL
Specific Gravity, Urine: 1.016 (ref 1.005–1.030)
pH: 5 (ref 5.0–8.0)

## 2020-02-16 LAB — BASIC METABOLIC PANEL
Anion gap: 11 (ref 5–15)
BUN: 15 mg/dL (ref 8–23)
CO2: 21 mmol/L — ABNORMAL LOW (ref 22–32)
Calcium: 8.5 mg/dL — ABNORMAL LOW (ref 8.9–10.3)
Chloride: 95 mmol/L — ABNORMAL LOW (ref 98–111)
Creatinine, Ser: 0.94 mg/dL (ref 0.61–1.24)
GFR calc Af Amer: >60 mL/min (ref >60)
GFR calc non Af Amer: >60 mL/min (ref >60)
Glucose, Bld: 112 mg/dL — ABNORMAL HIGH (ref 70–99)
Potassium: 3.7 mmol/L (ref 3.5–5.1)
Sodium: 127 mmol/L — ABNORMAL LOW (ref 135–145)

## 2020-02-16 NOTE — ED Provider Notes (Signed)
3:50 PM-care transferred from Dr. Tomi Bamberger to evaluate patient after return of labs.  He required Foley catheterization for urinary retention.  This is recurrent.  5:15 PM-labs returned, abnormals include sodium low 127, chloride low 95, CO2 low 21, glucose high 112, calcium low 8.5.  CBC is normal.  Urinalysis is consistent with UTI.  He is on antibiotics at this time.  At this time the patient is alert comfortable.  Foley catheter has a large amount of urine in it it is orange-colored, secondary he is taking Azo today.  Patient is comfortable understands he will have to maintain Foley catheter at home.  He will follow-up with his urologist this week.   Daleen Bo, MD 02/16/20 1735

## 2020-02-16 NOTE — ED Triage Notes (Signed)
Pt reports having a foley catheter placed about a week ago and it was was removed yesterday at urologist's office. Pt reports urinary retention that started today.

## 2020-02-16 NOTE — ED Provider Notes (Signed)
[facility] DEPT Provider Note   CSN: [id number] Arrival date & time: 02/16/20  1248     History Chief Complaint  Patient presents with  . Urinary Retention    Cory Parker is a 79 y.o. male.  HPI   Patient presented to the ED for evaluation of urinary retention.  Patient was recently diagnosed with urinary retention and had a Foley catheter placed.  He saw his urologist on Friday and had the catheter removed.  Patient states initially he was able to urinate but then since last night have not been able to urinate properly.  He feels like his bladder is becoming distended.  He has the urge to urinate but cannot do so properly.  Patient denies any fevers or chills.  Past Medical History:  Diagnosis Date  . Varicose veins     Patient Active Problem List   Diagnosis Date Noted  . Family history of heart disease 08/07/2019  . Erythema of lower extremity 07/30/2019  . Elevated PSA 05/09/2018  . Left leg pain 06/17/2017  . BPH (benign prostatic hyperplasia) 04/29/2017  . Preventative health care 04/12/2016  . Varicose veins of lower extremities with other complications 71/12/2692  . Venous (peripheral) insufficiency 08/30/2008    Past Surgical History:  Procedure Laterality Date  . APPENDECTOMY    . finger torn tendon laceration    . TONSILLECTOMY         Family History  Problem Relation Age of Onset  . Heart attack Father   . Kidney disease Mother   . Heart disease Mother     Social History   Tobacco Use  . Smoking status: Never Smoker  . Smokeless tobacco: Never Used  Vaping Use  . Vaping Use: Never used  Substance Use Topics  . Alcohol use: Yes    Alcohol/week: 1.0 - 2.0 standard drink    Types: 1 - 2 Cans of beer per week  . Drug use: No    Home Medications Prior to Admission medications   Medication Sig Start Date End Date Taking? Authorizing Provider  aspirin 81 MG tablet Take 81 mg by mouth daily.      [provider]  calcium-vitamin D (OSCAL WITH D) 500-200 MG-UNIT per tablet Take 1 tablet by mouth daily.      [provider]  cholecalciferol (VITAMIN D) 400 units TABS tablet Take 400 Units by mouth.    [provider]  ciprofloxacin (CIPRO) 250 MG tablet Take 250 mg by mouth 2 (two) times daily. 15 day supply 02/08/20   [provider]  DAILY MULTIPLE VITAMINS PO Take by mouth daily.      [provider]  finasteride (PROSCAR) 5 MG tablet Take 5 mg by mouth daily. 01/17/20   [provider]  Folic Acid 20 MG CAPS Take by mouth daily.      [provider]  tamsulosin (FLOMAX) 0.4 MG CAPS capsule  07/19/18   [provider]  vitamin C (ASCORBIC ACID) 500 MG tablet Take 500 mg by mouth daily.      [provider]    Allergies    Codeine  Review of Systems   Review of Systems  All other systems reviewed and are negative.   Physical Exam Updated Vital Signs BP (!) 179/80   Pulse 79   Temp 98.7 F (37.1 C) (Oral)   Resp 20   Ht 1.778 m (5\' 10" )   Wt 92.9 kg   SpO2  94%   BMI 29.40 kg/m   Physical Exam Vitals and nursing note reviewed.  Constitutional:      General: He is not in acute distress.    Appearance: He is well-developed.  HENT:     Head: Normocephalic and atraumatic.     Right Ear: External ear normal.     Left Ear: External ear normal.  Eyes:     General: No scleral icterus.       Right eye: No discharge.        Left eye: No discharge.     Conjunctiva/sclera: Conjunctivae normal.  Neck:     Trachea: No tracheal deviation.  Cardiovascular:     Rate and Rhythm: Normal rate and regular rhythm.  Pulmonary:     Effort: Pulmonary effort is normal. No respiratory distress.     Breath sounds: Normal breath sounds. No stridor. No wheezing or rales.  Abdominal:     General: Bowel sounds are normal. There is no distension.     Palpations: Abdomen is soft.     Tenderness: There is abdominal  tenderness. There is no guarding or rebound.     Comments: Tenderness suprapubic  region  Musculoskeletal:        General: No tenderness.     Cervical back: Neck supple.  Skin:    General: Skin is warm and dry.     Findings: No rash.  Neurological:     Mental Status: He is alert.     Cranial Nerves: No cranial nerve deficit (no facial droop, extraocular movements intact, no slurred speech).     Sensory: No sensory deficit.     Motor: No abnormal muscle tone or seizure activity.     Coordination: Coordination normal.     ED Results / Procedures / Treatments   Labs (all labs ordered are listed, but only abnormal results are displayed) Labs Reviewed  URINALYSIS, ROUTINE W REFLEX MICROSCOPIC - Abnormal; Notable for the following components:      Result Value   Color, Urine AMBER (*)    Hgb urine dipstick SMALL (*)    Ketones, ur 5 (*)    Nitrite POSITIVE (*)    All other components within normal limits  CBC  BASIC METABOLIC PANEL    EKG None  Radiology No results found.  Procedures Procedures (including critical care time)  Medications Ordered in ED Medications - No data to display  ED Course  I have reviewed the triage vital signs and the nursing notes.  Pertinent labs & imaging results that were available during my care of the patient were reviewed by me and considered in my medical decision making (see chart for details).  Clinical Course as of Feb 15 1545  Sat Feb 16, 2020  1356 O2 sat 97% at bedside.  Room air   [JK]  1542 Patient had almost 600 cc of urine in the Foley catheter.   [JK]    Clinical Course User Index [JK] Dorie Rank, MD   MDM Rules/Calculators/A&P                          Patient does have recurrent urinary retention.  He had almost 600 cc of urine once the Foley catheter was placed.  Urinalysis is abnormal but the patient is already on antibiotics.  CBC and metabolic panel are pending.  If normal anticipate discharge home with the Foley  catheter and outpatient urology follow-up.  Care transferred to Dr Eulis Foster  to check on labs Final Clinical Impression(s) / ED Diagnoses Final diagnoses:  Urinary retention     Dorie Rank, MD 02/16/20 1546

## 2020-02-16 NOTE — Discharge Instructions (Signed)
Continue taking your cipro abx.  Follow up with your urologist regarding the foley catheter

## 2020-02-22 DIAGNOSIS — R338 Other retention of urine: Secondary | ICD-10-CM | POA: Diagnosis not present

## 2020-02-25 DIAGNOSIS — R338 Other retention of urine: Secondary | ICD-10-CM | POA: Diagnosis not present

## 2020-03-03 DIAGNOSIS — R338 Other retention of urine: Secondary | ICD-10-CM | POA: Diagnosis not present

## 2020-03-04 DIAGNOSIS — R338 Other retention of urine: Secondary | ICD-10-CM | POA: Diagnosis not present

## 2020-03-27 ENCOUNTER — Telehealth (INDEPENDENT_AMBULATORY_CARE_PROVIDER_SITE_OTHER): Payer: Medicare Other | Admitting: Internal Medicine

## 2020-03-27 ENCOUNTER — Encounter: Payer: Self-pay | Admitting: Internal Medicine

## 2020-03-27 ENCOUNTER — Other Ambulatory Visit: Payer: Self-pay

## 2020-03-27 VITALS — BP 128/61 | HR 73 | Wt 228.0 lb

## 2020-03-27 DIAGNOSIS — J069 Acute upper respiratory infection, unspecified: Secondary | ICD-10-CM

## 2020-03-27 MED ORDER — PREDNISONE 10 MG PO TABS: ORAL_TABLET | ORAL | 0 refills | Status: DC

## 2020-03-27 MED ORDER — BENZONATATE 200 MG PO CAPS
200.0000 mg | ORAL_CAPSULE | Freq: Three times a day (TID) | ORAL | 0 refills | Status: DC | PRN
Start: 2020-03-27 — End: 2021-04-16

## 2020-03-27 NOTE — Progress Notes (Signed)
Virtual Visit via Video Note  I connected with Cory Parker on 03/27/20 at  3:30 PM EDT by a video enabled telemedicine application and verified that I am speaking with the correct person using two identifiers.  Location: Patient: Home Provider: Office  Person's participating in this video call: Webb Silversmith, NP-C and ASA Ohm  I discussed the limitations of evaluation and management by telemedicine and the availability of in person appointments. The patient expressed understanding and agreed to proceed.  History of Present Illness:  Pt reports nasal congestion, sore throat and cough. This started 2-3 days ago. He is blowing clear mucous out of his nose. He reports the throat is scratchy but denies difficulty swallowing. The cough is dry and non productive. He denies headache, dizziness, visual changes, runny nose, ear pain, loss of taste/smell, SOB or chest pain. He denies nausea, vomiting and diarrhea. He denies fever, chills or body aches. He has not tried any medications OTC for this. He does not smoke. He has not had Covid exposure that he is aware of. He has had his Covid vaccines.  Past Medical History:  Diagnosis Date  . Varicose veins     Current Outpatient Medications  Medication Sig Dispense Refill  . aspirin 81 MG tablet Take 81 mg by mouth daily.      . calcium-vitamin D (OSCAL WITH D) 500-200 MG-UNIT per tablet Take 1 tablet by mouth daily.      . cholecalciferol (VITAMIN D) 400 units TABS tablet Take 400 Units by mouth.    . ciprofloxacin (CIPRO) 250 MG tablet Take 250 mg by mouth 2 (two) times daily. 15 day supply    . DAILY MULTIPLE VITAMINS PO Take by mouth daily.      . finasteride (PROSCAR) 5 MG tablet Take 5 mg by mouth daily.    . Folic Acid 20 MG CAPS Take by mouth daily.      . tamsulosin (FLOMAX) 0.4 MG CAPS capsule Take 0.4 mg by mouth every evening.     . vitamin C (ASCORBIC ACID) 500 MG tablet Take 500 mg by mouth daily.       No current  facility-administered medications for this visit.    Allergies  Allergen Reactions  . Codeine     Family History  Problem Relation Age of Onset  . Heart attack Father   . Kidney disease Mother   . Heart disease Mother     Social History   Socioeconomic History  . Marital status: Married    Spouse name: Not on file  . Number of children: Not on file  . Years of education: Not on file  . Highest education level: Not on file  Occupational History  . Not on file  Tobacco Use  . Smoking status: Never Smoker  . Smokeless tobacco: Never Used  Vaping Use  . Vaping Use: Never used  Substance and Sexual Activity  . Alcohol use: Yes    Alcohol/week: 1.0 - 2.0 standard drink    Types: 1 - 2 Cans of beer per week  . Drug use: No  . Sexual activity: Not on file  Other Topics Concern  . Not on file  Social History Narrative  . Not on file   Social Determinants of Health   Financial Resource Strain: Low Risk   . Difficulty of Paying Living Expenses: Not hard at all  Food Insecurity: No Food Insecurity  . Worried About Charity fundraiser in the Last Year: Never true  .  Ran Out of Food in the Last Year: Never true  Transportation Needs: No Transportation Needs  . Lack of Transportation (Medical): No  . Lack of Transportation (Non-Medical): No  Physical Activity: Inactive  . Days of Exercise per Week: 0 days  . Minutes of Exercise per Session: 0 min  Stress: No Stress Concern Present  . Feeling of Stress : Not at all  Social Connections:   . Frequency of Communication with Friends and Family: Not on file  . Frequency of Social Gatherings with Friends and Family: Not on file  . Attends Religious Services: Not on file  . Active Member of Clubs or Organizations: Not on file  . Attends Archivist Meetings: Not on file  . Marital Status: Not on file  Intimate Partner Violence: Not At Risk  . Fear of Current or Ex-Partner: No  . Emotionally Abused: No  . Physically  Abused: No  . Sexually Abused: No     Constitutional: Denies fever, malaise, fatigue, headache or abrupt weight changes.  HEENT: Pt reports nasal congestion, sore throat. Denies eye pain, eye redness, ear pain, ringing in the ears, wax buildup, runny nose, bloody nose Respiratory: Pt reports cough. Denies difficulty breathing, shortness of breath, cough or sputum production.   Cardiovascular: Denies chest pain, chest tightness, palpitations or swelling in the hands or feet.   No other specific complaints in a complete review of systems (except as listed in HPI above).  Observations/Objective:  BP 128/61   Pulse 73   Wt 228 lb (103.4 kg)   BMI 32.71 kg/m   Wt Readings from Last 3 Encounters:  02/16/20 204 lb 14.4 oz (92.9 kg)  02/09/20 230 lb (104.3 kg)  09/13/19 240 lb 4.8 oz (109 kg)    General: Appears his stated age, well developed, well nourished in NAD. HEENT: Head: normal shape and size;  Nose: slight congestion noted ; Throat/Mouth: slight hoarseness noted. Pulmonary/Chest: Normal effort. No cough noted.  No respiratory distress.  Neurological: Alert and oriented.   BMET    Component Value Date/Time   NA 127 (L) 02/16/2020 1513   K 3.7 02/16/2020 1513   CL 95 (L) 02/16/2020 1513   CO2 21 (L) 02/16/2020 1513   GLUCOSE 112 (H) 02/16/2020 1513   BUN 15 02/16/2020 1513   CREATININE 0.94 02/16/2020 1513   CALCIUM 8.5 (L) 02/16/2020 1513   GFRNONAA >60 02/16/2020 1513   GFRAA >60 02/16/2020 1513    Lipid Panel     Component Value Date/Time   CHOL 149 05/08/2019 0821   TRIG 102.0 05/08/2019 0821   HDL 46.80 05/08/2019 0821   CHOLHDL 3 05/08/2019 0821   VLDL 20.4 05/08/2019 0821   LDLCALC 82 05/08/2019 0821    CBC    Component Value Date/Time   WBC 9.5 02/16/2020 1513   RBC 4.81 02/16/2020 1513   HGB 15.4 02/16/2020 1513   HCT 42.0 02/16/2020 1513   PLT 182 02/16/2020 1513   MCV 87.3 02/16/2020 1513   MCH 32.0 02/16/2020 1513   MCHC 36.7 (H)  02/16/2020 1513   RDW 12.2 02/16/2020 1513   LYMPHSABS 2.0 07/31/2019 0926   MONOABS 0.6 07/31/2019 0926   EOSABS 0.1 07/31/2019 0926   BASOSABS 0.1 07/31/2019 0926    Hgb A1C Lab Results  Component Value Date   HGBA1C 5.3 04/29/2017       Assessment and Plan:  Viral URI with Cough:  Does not seem c/s with Covid No indication for abx at  this time RX for Pred Taper x 6 days RX for Tessalon 200 mg TID prn If no improvement by Monday, will send in Azithromax  Return precautions discussed  Follow Up Instructions:    I discussed the assessment and treatment plan with the patient. The patient was provided an opportunity to ask questions and all were answered. The patient agreed with the plan and demonstrated an understanding of the instructions.   The patient was advised to call back or seek an in-person evaluation if the symptoms worsen or if the condition fails to improve as anticipated.     Webb Silversmith, NP

## 2020-03-27 NOTE — Patient Instructions (Signed)

## 2020-03-30 ENCOUNTER — Encounter: Payer: Self-pay | Admitting: Internal Medicine

## 2020-03-31 MED ORDER — AZITHROMYCIN 250 MG PO TABS
ORAL_TABLET | ORAL | 0 refills | Status: DC
Start: 2020-03-31 — End: 2021-04-16

## 2020-04-03 DIAGNOSIS — R338 Other retention of urine: Secondary | ICD-10-CM | POA: Diagnosis not present

## 2020-04-10 ENCOUNTER — Other Ambulatory Visit: Payer: Self-pay | Admitting: Urology

## 2020-04-14 NOTE — Patient Instructions (Addendum)
DUE TO COVID-19 ONLY ONE VISITOR IS ALLOWED TO COME WITH YOU AND STAY IN THE WAITING ROOM ONLY DURING PRE OP AND PROCEDURE DAY OF SURGERY. THE 1 VISITOR  MAY VISIT WITH YOU AFTER SURGERY IN YOUR PRIVATE ROOM DURING VISITING HOURS ONLY!  YOU NEED TO HAVE A COVID 19 TEST ON___10/15____ @__0845_____ , THIS TEST MUST BE DONE BEFORE SURGERY,  COVID TESTING SITE [address] [place] [address], IT IS ON THE RIGHT GOING OUT [address] APPROXIMATELY  2 MINUTES PAST [employer] ON THE RIGHT. ONCE YOUR COVID TEST IS COMPLETED,  PLEASE BEGIN THE QUARANTINE INSTRUCTIONS AS OUTLINED IN YOUR HANDOUT.                [name]    Your procedure is scheduled on: 04/22/20   Report to [facility] [place] Main  Entrance   Report to admitting at  9:45 AM     Call this number if you have problems the morning of surgery [phone]    Remember: Do not eat food or drink liquids :After Midnight.   BRUSH YOUR TEETH MORNING OF SURGERY AND RINSE YOUR MOUTH OUT, NO CHEWING GUM CANDY OR MINTS.     Take these medicines the morning of surgery with A SIP OF WATER: Finesteride  DO NOT TAKE ANY DIABETIC MEDICATIONS DAY OF YOUR SURGERY                               You may not have any metal on your body including              piercings  Do not wear jewelry, lotions, powders or deodorant                       Men may shave face and neck.   Do not bring valuables to the hospital. [place].  Contacts, dentures or bridgework may not be worn into surgery.       Patients discharged the day of surgery will not be allowed to drive home.   IF YOU ARE HAVING SURGERY AND GOING HOME THE SAME DAY, YOU MUST HAVE AN ADULT TO DRIVE YOU HOME AND BE WITH YOU FOR 24 HOURS.   YOU MAY GO HOME BY TAXI OR UBER OR ORTHERWISE, BUT AN ADULT MUST ACCOMPANY YOU HOME AND STAY WITH YOU FOR 24 HOURS.  Name and phone number of your driver:  Special  Instructions: N/A              Please read over the following fact sheets you were given: _____________________________________________________________________             [facility] - Preparing for Surgery Before surgery, you can play an important role.   Because skin is not sterile, your skin needs to be as free of germs as possible.   You can reduce the number of germs on your skin by washing with CHG (chlorahexidine gluconate) soap before surgery.   CHG is an antiseptic cleaner which kills germs and bonds with the skin to continue killing germs even after washing. Please DO NOT use if you have an allergy to CHG or antibacterial soaps .  If your skin becomes reddened/irritated stop using the CHG and inform your nurse when you arrive at Short Stay.  You  may shave your face/neck. Please follow these instructions carefully:  1.  Shower with CHG Soap the night before surgery and the  morning of Surgery.  2.  If you choose to wash your hair, wash your hair first as usual with your  normal  shampoo.  3.  After you shampoo, rinse your hair and body thoroughly to remove the  shampoo.                                        4.  Use CHG as you would any other liquid soap.  You can apply chg directly  to the skin and wash                       Gently with a scrungie or clean washcloth.  5.  Apply the CHG Soap to your body ONLY FROM THE NECK DOWN.   Do not use on face/ open                           Wound or open sores. Avoid contact with eyes, ears mouth and genitals (private parts).                       Wash face,  Genitals (private parts) with your normal soap.             6.  Wash thoroughly, paying special attention to the area where your surgery  will be performed.  7.  Thoroughly rinse your body with warm water from the neck down.  8.  DO NOT shower/wash with your normal soap after using and rinsing off  the CHG Soap.             9.  Pat yourself dry with a clean towel.            10.   Wear clean pajamas.            11.  Place clean sheets on your bed the night of your first shower and do not  sleep with pets. Day of Surgery : Do not apply any lotions/deodorants the morning of surgery.  Please wear clean clothes to the hospital/surgery center.  FAILURE TO FOLLOW THESE INSTRUCTIONS MAY RESULT IN THE CANCELLATION OF YOUR SURGERY PATIENT SIGNATURE_________________________________  NURSE SIGNATURE__________________________________  ________________________________________________________________________

## 2020-04-16 ENCOUNTER — Other Ambulatory Visit: Payer: Self-pay

## 2020-04-16 ENCOUNTER — Encounter (HOSPITAL_COMMUNITY): Payer: Self-pay

## 2020-04-16 ENCOUNTER — Encounter (HOSPITAL_COMMUNITY)
Admission: RE | Admit: 2020-04-16 | Discharge: 2020-04-16 | Disposition: A | Discharge status: Home / Self Care | Payer: Medicare Other | Source: Ambulatory Visit | Attending: Urology | Admitting: Urology

## 2020-04-16 DIAGNOSIS — Z01812 Encounter for preprocedural laboratory examination: Secondary | ICD-10-CM | POA: Diagnosis not present

## 2020-04-16 HISTORY — DX: Personal history of other diseases of the respiratory system: Z87.09

## 2020-04-16 LAB — CBC
HCT: 46.6 % (ref 39.0–52.0)
Hemoglobin: 16.3 g/dL (ref 13.0–17.0)
MCH: 31.4 pg (ref 26.0–34.0)
MCHC: 35 g/dL (ref 30.0–36.0)
MCV: 89.8 fL (ref 80.0–100.0)
Platelets: 191 10*3/uL (ref 150–400)
RBC: 5.19 MIL/uL (ref 4.22–5.81)
RDW: 12.5 % (ref 11.5–15.5)
WBC: 7 10*3/uL (ref 4.0–10.5)
nRBC: 0 % (ref 0.0–0.2)

## 2020-04-16 NOTE — Progress Notes (Signed)
Patient verbalized understanding of instructions that were given to them at the PAT appointment. Patient was also instructed that they will need to review over the PAT instructions again at home before surgery.

## 2020-04-18 ENCOUNTER — Other Ambulatory Visit (HOSPITAL_COMMUNITY)
Admission: RE | Admit: 2020-04-18 | Discharge: 2020-04-18 | Disposition: A | Discharge status: Home / Self Care | Payer: Medicare Other | Source: Ambulatory Visit | Attending: Urology | Admitting: Urology

## 2020-04-18 DIAGNOSIS — Z20822 Contact with and (suspected) exposure to covid-19: Secondary | ICD-10-CM | POA: Diagnosis not present

## 2020-04-18 DIAGNOSIS — Z01812 Encounter for preprocedural laboratory examination: Secondary | ICD-10-CM | POA: Insufficient documentation

## 2020-04-18 LAB — SARS CORONAVIRUS 2 (TAT 6-24 HRS): SARS Coronavirus 2: NEGATIVE

## 2020-04-21 NOTE — H&P (Signed)
Cory Parker is a 79 year-old male established patient who is here for follow up for further evaluation of his elevated PSA.   His last PSA was performed 06/20/2019. The last PSA value was 5.6.   He has not had a prostate biopsy. Patient does not have a family history of prostate cancer. The patient states he does not take 5 alpha reductase inhibitor medication.   He has not recently had unwanted weight loss. He is not having new bone pain. The patient complains of lower urinary tract symptom(s) that include frequency and urgency. This condition would be considered of mild to moderate severity with no modifying factors or associated signs or symptoms other than as noted above.   06/22/18: He reports that his PSA last year was 4.4. His previous PSA results were not supplied but his PSA has now increased to 5.59. He does have some voiding symptoms consisting of nocturia x1, hesitancy and occasional urgency and frequency. He denies any unexplained bone pain or weight loss. He has no family history of prostate cancer. He has not seen hematuria and denies prostatitis or UTI.   12/28/18: He has returned today for follow-up of an elevated PSA and reports that when I saw him 6 months ago and placed him on tamsulosin he continues to have some voiding symptoms. He said he still is only getting up once at night but is bothered mainly by frequency in the daytime and some urgency as well. He also said he has a sensation of some slight discomfort at the initiation of his stream but he said that goes away as he urinates.   06/26/19: He returns for follow-up of a slightly elevated PSA and reports that as far as urination goes he is having some days where he goes every 15-20 minutes does not seem to be associated with caffeine intake. We had discussed possibly trying to stop his tamsulosin in the past but he has remained on this. He said other days he will go for several hours without having to urinate at all. He does  feel as if he empties his bladder adequately   -01/17/20-patient with history of BPH and marginal PSA elevation. Has been on tamsulosin 0.4 mg daily with relatively stable symptoms. Main issue is continued intermittent urgency. He was offered Myrbetriq but declined in the past. Here now for follow-up. Last PSA was 5.18 on 09/25/2019. Postvoid residual today equals 52 cc. Micro urinalysis today is clear   02/08/2020: Started on finasteride at last office visit. The patient continues tamsulosin. Beginning approximately 3 days ago the patient began having increased urinary frequency/urgency with urge to void every 15-20 minutes. This progressively worsened with patient having burning and painful urination as well as some increased intermittency of stream and associated hesitancy/straining. He has taken some AZ0 provided by his wife which has helped some but symptoms still remained persistent per his report. He has not had any visible blood in the urine. He denies any correlating lower back or flank pain/discomfort. He does endorse some mild increase in pelvic pain/pressure as well as some perineal discomfort and fecal urgency. He denies fevers or chills, nausea/vomiting. At the onset of symptoms, he did stop finasteride stating he was told by his urologist to do this if he encountered any new or worsening lower urinary tract symptoms. He does continue tamsulosin as directed.   02/12/2020: After being started on Cipro for possible prostatitis at last office visit, the patient presented to the emergency department 1 day later with  painful inability to void. He had a bladder scan done for 681 mL. Foley catheter was inserted with close to 1000 mL output noted. He had labs done in the emergency department as well which did noted acute arise from baseline in creatinine, 1.46 at time of assessment. Due to high-volume in the emergency department he was never evaluated by a physician and left without being seen. Foley  catheter was placed with verbal order from the attending ED P. he continues tamsulosin and Cipro as directed today. Returns today for repeat evaluation with indwelling Foley catheter.  Beginning 3 days ago, he began having diarrhea x1 described as a blow out each time. Previously not constipated. Not associated with any acute abdominal pain, nausea/vomiting. He continues tamsulosin and Cipro as directed. He is tolerating catheter well describing it more of a nuisance especially at night but otherwise draining appropriately without any gross hematuria. He denies painful urgency or leaking around Catheter tubing.   02/15/2020: I determine more time was needed with indwelling catheter prior to proceeding with formal trial of void at last office visit. He returns today for trial of void.  -02/22/20-patient with history of BPH and possible recent prostatitis resulting in acute urinary retention. Has had indwelling Foley and treated with tamsulosin at 0.8 mg daily and Cipro. Foley was removed on 08/13 2021 for voiding trial. Foley subsequently replaced on 02/16/2020 at [facility] emergency room... The patient had been initiated on finasteride back on 01/17/2020 but was stopped.   -02/25/20-a a patient with history of recent urinary retention. Has been on tamsulosin 0.8 mg and finasteride 5 mg daily. Finished up Cipro which he was being treated for prostatitis. Here now for cysto to assess prostate and bladder and for voiding trial.  Cysto was performed today shows trilobar prostatic hypertrophy with intravesical median lobe and prostatic urethral length of about 4 cm. No obvious mucosal lesions noted within the bladder does have inflammatory response from Foley catheter in the base of the bladder. Approximately 375 cc was instilled in the bladder. Voiding trial after cystoscopy was able to void about 250.  Addendum: Patient returned later in the afternoon unable to void. Postvoid residual was 500 cc. Foley catheter  was replaced without difficulty.  -03/03/20-patient with history of urinary retention currently on tamsulosin 0.8 mg and finasteride 5 mg daily. The patient returned today is seemed very upset that things were not progressing regarding his urinary retention. His wife wrote a letter basically stating that she felt that her husband had been betrayed by DR Karsten Ro retiring without being notified. He is very frustrated that he cannot have surgical procedure. I told him that could restrictions are for urgent case is now in the hospital and he seemed very dismayed about this. I told him that as soon as we could schedule something in the operating room we can get him on for cysto and TURP. In the meantime I want him to continue on the finasteride and tamsulosin. He had not taken his tamsulosin today and therefore is going to come back tomorrow for voiding trial making sure that he takes his tamsulosin prior to the office visit.  -04/21/20-Hx of BPH related urinary retention as above, pt to be scheduled for cysto TURP, he did not want to try Rezum.   ALLERGIES: codeine    MEDICATIONS: Finasteride 5 mg tablet 1 tablet PO Daily  Phenazopyridine Hcl 200 mg tablet 1 tablet PO Q 8 H PRN  Tamsulosin Hcl 0.4 mg capsule  Aspirin 81  MG TABS Oral  Azo Bladder Control  Folic Acid  Multi-Vitamin Oral Tablet Oral  Vitamin D 1000 UNIT Oral Tablet Oral     GU PSH: Cystoscopy - 02/25/2020       PSH Notes: Hand Surgery, Tonsillectomy, Appendectomy, Complete Colonoscopy   NON-GU PSH: Appendectomy - 2012 Diagnostic Colonoscopy - 2012 Remove Tonsils - 2012     GU PMH: BPH w/LUTS (Stable) - 02/25/2020, Benign prostatic hyperplasia with urinary obstruction, - 2014 Urinary Retention - 02/12/2020 Acute prostatitis - 02/08/2020 Elevated PSA (Stable), I noted a benign prostate on exam and a PSA that is 5.6 with a good % free PSA so we discussed prostate biopsy as an option to evaluate his PSA elevation verses continued  close monitoring. The decision has been to proceed with monitoring with repeat PSA in 3 months and DRE and PSA again in 6 months. - 06/26/2019, (Stable), His prostate is enlarged but his PSA remains stable currently at 4.75 with a good free to total ratio. I told him I was not particularly concerned but will continue to monitor him every 3 months., - 12/28/2018, I noted no abnormality of his prostate on exam. Understanding the risk of observation he has elected to proceed with close observation rather than a prostate biopsy at this time which I think is reasonable with the understanding that if his PSA continues to show a persistent rise we would then proceed with a prostate biopsy., - 06/22/2018 Incomplete bladder emptying (Stable), He had a slight elevation of his PVR but I am not sure that this is going to be an issue even if I put him on some Myrbetriq for his urinary frequency. - 06/26/2019 Urinary Frequency (Stable), He has frequency and urgency in the daytime but not much nocturia. I will check a PVR when he returns and if he remains symptomatic consider - 12/28/2018 Urinary Hesitancy, His voiding symptoms which include occasional episodes of urgency and frequency as well as some hesitancy in the morning and nocturia x1 are enough of a bother that he would want to consider pharmacologic therapy. I therefore will place him on tamsulosin. We will reassess his voiding when he returns. - 06/22/2018 Primary hypogonadism, Hypogonadism, testicular - 2014      PMH Notes: .   NON-GU PMH: No Non-GU PMH    FAMILY HISTORY: 1 Daughter - Daughter 1 son - Son Acute Myocardial Infarction - Father Death In The Family Father - Runs In Family Family Health Status Number - Runs In Family Heart Disease - Father Hiatal hernia - Mother kidney disease - Mother   SOCIAL HISTORY: Marital Status: Married Preferred Language: English; Ethnicity: Not Hispanic Or Latino; Race: White Current Smoking Status: Patient has  never smoked.   Tobacco Use Assessment Completed: Used Tobacco in last 30 days? Types of alcohol consumed: Beer. Social Drinker.  Drinks 2 caffeinated drinks per day.     Notes: Alcohol Use, Occupation:, Marital History - Currently Married, Tobacco Use, Caffeine Use   REVIEW OF SYSTEMS:    GU Review Male:   Patient reports trouble starting your stream and have to strain to urinate . Patient denies frequent urination, hard to postpone urination, burning/ pain with urination, get up at night to urinate, leakage of urine, stream starts and stops, erection problems, and penile pain.  Gastrointestinal (Upper):   Patient denies nausea, vomiting, and indigestion/ heartburn.  Gastrointestinal (Lower):   Patient reports diarrhea. Patient denies constipation.  Constitutional:   Patient reports night sweats and fatigue. Patient denies  weight loss and fever.  Skin:   Patient denies skin rash/ lesion and itching.  Eyes:   Patient denies blurred vision and double vision.  Ears/ Nose/ Throat:   Patient reports sinus problems. Patient denies sore throat.  Hematologic/Lymphatic:   Patient denies swollen glands and easy bruising.  Cardiovascular:   Patient reports leg swelling. Patient denies chest pains.  Respiratory:   Patient reports cough. Patient denies shortness of breath.  Endocrine:   Patient denies excessive thirst.  Musculoskeletal:   Patient denies back pain and joint pain.  Neurological:   Patient reports headaches and dizziness.   Psychologic:   Patient denies depression and anxiety.   VITAL SIGNS:      03/03/2020 08:03 AM  Weight 230 lb / 104.33 kg  Height 69 in / 175.26 cm  BP 165/75 mmHg  Pulse 73 /min  Temperature 97.7 F / 36.5 C  BMI 34.0 kg/m   GU PHYSICAL EXAMINATION:    Anus and Perineum: No hemorrhoids. No anal stenosis. No rectal fissure, no anal fissure. No edema, no dimple, no perineal tenderness, no anal tenderness.  Scrotum: No lesions. No edema. No cysts. No warts.   Epididymides: Right: no spermatocele, no masses, no cysts, no tenderness, no induration, no enlargement. Left: no spermatocele, no masses, no cysts, no tenderness, no induration, no enlargement.  Testes: No tenderness, no swelling, no enlargement left testes. No tenderness, no swelling, no enlargement right testes. Normal location left testes. Normal location right testes. No mass, no cyst, no varicocele, no hydrocele left testes. No mass, no cyst, no varicocele, no hydrocele right testes.  Urethral Meatus: Normal size. No lesion, no wart, no discharge, no polyp. Normal location.  Penis: Circumcised, no warts, no cracks. No dorsal Peyronie's plaques, no left corporal Peyronie's plaques, no right corporal Peyronie's plaques, no scarring, no warts. No balanitis, no meatal stenosis.  Prostate: 40 gram or 2+ size. Left lobe normal consistency, right lobe normal consistency. Symmetrical lobes. No prostate nodule. Left lobe no tenderness, right lobe no tenderness.  Seminal Vesicles: Nonpalpable.  Sphincter Tone: Normal sphincter. No rectal tenderness. No rectal mass.    MULTI-SYSTEM PHYSICAL EXAMINATION:    Constitutional: Well-nourished. No physical deformities. Normally developed. Good grooming.  Neck: Neck symmetrical, not swollen. Normal tracheal position.  Respiratory: No labored breathing, no use of accessory muscles.   Cardiovascular: Normal temperature, normal extremity pulses, no swelling, no varicosities.  Lymphatic: No enlargement of neck, axillae, groin.  Skin: No paleness, no jaundice, no cyanosis. No lesion, no ulcer, no rash.  Neurologic / Psychiatric: Oriented to time, oriented to place, oriented to person. No depression, no anxiety, no agitation.  Gastrointestinal: No mass, no tenderness, no rigidity, non obese abdomen.  Eyes: Normal conjunctivae. Normal eyelids.  Ears, Nose, Mouth, and Throat: Left ear no scars, no lesions, no masses. Right ear no scars, no lesions, no masses. Nose no  scars, no lesions, no masses. Normal hearing. Normal lips.  Musculoskeletal: Normal gait and station of head and neck.     Complexity of Data:  Records Review:   Previous Doctor Records   01/17/20 09/25/19 06/22/19 03/23/19 12/22/18 09/21/18 05/03/18 04/29/17  PSA  Total PSA 6.04 ng/mL 5.18 ng/mL 5.60 ng/mL 4.94 ng/mL 4.75 ng/mL 4.20 ng/mL 5.59 ng/dl 5.44 ng/dl  Free PSA 1.42 ng/mL 1.51 ng/mL 1.45 ng/mL 1.30 ng/mL 1.29 ng/mL 1.30 ng/mL    % Free PSA 24 % PSA 29 % PSA 26 % PSA 26 % PSA 27 % PSA 31 % PSA  09/17/10  Hormones  Testosterone, Total 346.53     PROCEDURES: None   ASSESSMENT:      ICD-10 Details  1 GU:   Urinary Retention - R33.8 Chronic, Stable  2   BPH w/LUTS - N40.1 Chronic, Stable   PLAN:           Schedule Return Visit/Planned Activity: 03/04/2020 - Nurse Visit             Note: First a.m. appointment for trial of voiding          Document Letter(s):  Created for Patient: Clinical Summary         Notes:   Patient will be scheduled for early a.m. appointment tomorrow for nurse visit and trial of voiding. Am hopeful that things will perhaps turnaround for him but if he remains with Foley will consider cysto and TURP when operating room available based on Covid situation. Risks/benefits of TURP were discussed in detail, pt desires to proceed.

## 2020-04-22 ENCOUNTER — Observation Stay (HOSPITAL_COMMUNITY)
Admission: RE | Admit: 2020-04-22 | Discharge: 2020-04-23 | Disposition: A | Discharge status: Home / Self Care | Payer: Medicare Other | Attending: Urology | Admitting: Urology

## 2020-04-22 ENCOUNTER — Encounter (HOSPITAL_COMMUNITY)
Admission: RE | Disposition: A | Discharge status: Home / Self Care | Payer: Self-pay | Source: Home / Self Care | Attending: Urology

## 2020-04-22 ENCOUNTER — Other Ambulatory Visit: Payer: Self-pay

## 2020-04-22 ENCOUNTER — Ambulatory Visit (HOSPITAL_COMMUNITY): Payer: Medicare Other | Admitting: Physician Assistant

## 2020-04-22 ENCOUNTER — Encounter (HOSPITAL_COMMUNITY): Payer: Self-pay | Admitting: Urology

## 2020-04-22 DIAGNOSIS — I83899 Varicose veins of unspecified lower extremities with other complications: Secondary | ICD-10-CM | POA: Diagnosis not present

## 2020-04-22 DIAGNOSIS — R338 Other retention of urine: Secondary | ICD-10-CM | POA: Insufficient documentation

## 2020-04-22 DIAGNOSIS — N401 Enlarged prostate with lower urinary tract symptoms: Secondary | ICD-10-CM

## 2020-04-22 DIAGNOSIS — N400 Enlarged prostate without lower urinary tract symptoms: Secondary | ICD-10-CM | POA: Diagnosis present

## 2020-04-22 DIAGNOSIS — Z8249 Family history of ischemic heart disease and other diseases of the circulatory system: Secondary | ICD-10-CM | POA: Diagnosis not present

## 2020-04-22 HISTORY — PX: CYSTOSCOPY: SHX5120

## 2020-04-22 HISTORY — PX: TRANSURETHRAL RESECTION OF PROSTATE: SHX73

## 2020-04-22 LAB — BASIC METABOLIC PANEL
Anion gap: 11 (ref 5–15)
BUN: 19 mg/dL (ref 8–23)
CO2: 23 mmol/L (ref 22–32)
Calcium: 9.3 mg/dL (ref 8.9–10.3)
Chloride: 105 mmol/L (ref 98–111)
Creatinine, Ser: 1.06 mg/dL (ref 0.61–1.24)
GFR, Estimated: >60 mL/min (ref >60)
Glucose, Bld: 105 mg/dL — ABNORMAL HIGH (ref 70–99)
Potassium: 4 mmol/L (ref 3.5–5.1)
Sodium: 139 mmol/L (ref 135–145)

## 2020-04-22 SURGERY — TURP (TRANSURETHRAL RESECTION OF PROSTATE)
Anesthesia: General

## 2020-04-22 MED ORDER — OXYBUTYNIN CHLORIDE 5 MG PO TABS: 5.0000 mg | ORAL_TABLET | Freq: Three times a day (TID) | ORAL | Status: DC | PRN

## 2020-04-22 MED ORDER — PROPOFOL 10 MG/ML IV BOLUS
INTRAVENOUS | Status: DC | PRN
  Administered 2020-04-22: 50 mg via INTRAVENOUS
  Administered 2020-04-22: 25 mg via INTRAVENOUS
  Administered 2020-04-22: 175 mg via INTRAVENOUS
  Administered 2020-04-22: 100 mg via INTRAVENOUS

## 2020-04-22 MED ORDER — BELLADONNA ALKALOIDS-OPIUM 16.2-60 MG RE SUPP
RECTAL | Status: DC | PRN
  Administered 2020-04-22: 1 via RECTAL

## 2020-04-22 MED ORDER — STERILE WATER FOR IRRIGATION IR SOLN
Status: DC | PRN
  Administered 2020-04-22: 250 mL

## 2020-04-22 MED ORDER — ACETAMINOPHEN 325 MG PO TABS
650.0000 mg | ORAL_TABLET | ORAL | Status: DC | PRN
  Administered 2020-04-22 – 2020-04-23 (×2): 650 mg via ORAL
  Filled 2020-04-22 (×2): qty 2

## 2020-04-22 MED ORDER — PHENYLEPHRINE 40 MCG/ML (10ML) SYRINGE FOR IV PUSH (FOR BLOOD PRESSURE SUPPORT)
PREFILLED_SYRINGE | INTRAVENOUS | Status: DC | PRN
  Administered 2020-04-22: 80 ug via INTRAVENOUS
  Administered 2020-04-22 (×2): 120 ug via INTRAVENOUS
  Administered 2020-04-22 (×2): 80 ug via INTRAVENOUS
  Administered 2020-04-22: 120 ug via INTRAVENOUS

## 2020-04-22 MED ORDER — FENTANYL CITRATE (PF) 100 MCG/2ML IJ SOLN
25.0000 ug | INTRAMUSCULAR | Status: DC | PRN
  Administered 2020-04-22: 50 ug via INTRAVENOUS

## 2020-04-22 MED ORDER — ORAL CARE MOUTH RINSE: 15.0000 mL | Freq: Once | OROMUCOSAL | Status: AC

## 2020-04-22 MED ORDER — SODIUM CHLORIDE 0.9 % IR SOLN
3000.0000 mL | Status: DC
  Administered 2020-04-22 – 2020-04-23 (×2): 3000 mL

## 2020-04-22 MED ORDER — AMISULPRIDE (ANTIEMETIC) 5 MG/2ML IV SOLN: 10.0000 mg | Freq: Once | INTRAVENOUS | Status: DC | PRN

## 2020-04-22 MED ORDER — SODIUM CHLORIDE 0.9 % IR SOLN
Status: DC | PRN
  Administered 2020-04-22: 1000 mL
  Administered 2020-04-22 (×2): 6000 mL
  Administered 2020-04-22: 3000 mL
  Administered 2020-04-22: 6000 mL

## 2020-04-22 MED ORDER — ONDANSETRON HCL 4 MG/2ML IJ SOLN: 4.0000 mg | INTRAMUSCULAR | Status: DC | PRN

## 2020-04-22 MED ORDER — ROCURONIUM BROMIDE 10 MG/ML (PF) SYRINGE
PREFILLED_SYRINGE | INTRAVENOUS | Status: DC | PRN
  Administered 2020-04-22: 10 mg via INTRAVENOUS
  Administered 2020-04-22: 40 mg via INTRAVENOUS

## 2020-04-22 MED ORDER — LIDOCAINE 2% (20 MG/ML) 5 ML SYRINGE
INTRAMUSCULAR | Status: AC
  Filled 2020-04-22: qty 5

## 2020-04-22 MED ORDER — FENTANYL CITRATE (PF) 100 MCG/2ML IJ SOLN
INTRAMUSCULAR | Status: AC
Start: 2020-04-22 — End: 2020-04-22
  Administered 2020-04-22: 50 ug via INTRAVENOUS
  Filled 2020-04-22: qty 2

## 2020-04-22 MED ORDER — DEXAMETHASONE SODIUM PHOSPHATE 10 MG/ML IJ SOLN
INTRAMUSCULAR | Status: AC
  Filled 2020-04-22: qty 1

## 2020-04-22 MED ORDER — PROPOFOL 10 MG/ML IV BOLUS
INTRAVENOUS | Status: AC
  Filled 2020-04-22: qty 20

## 2020-04-22 MED ORDER — FINASTERIDE 5 MG PO TABS
5.0000 mg | ORAL_TABLET | Freq: Every day | ORAL | Status: DC
  Administered 2020-04-23: 5 mg via ORAL
  Filled 2020-04-22: qty 1

## 2020-04-22 MED ORDER — OXYCODONE HCL 5 MG PO TABS: 5.0000 mg | ORAL_TABLET | Freq: Once | ORAL | Status: DC | PRN

## 2020-04-22 MED ORDER — CHLORHEXIDINE GLUCONATE 0.12 % MT SOLN
15.0000 mL | Freq: Once | OROMUCOSAL | Status: AC
  Administered 2020-04-22: 15 mL via OROMUCOSAL

## 2020-04-22 MED ORDER — PHENYLEPHRINE 40 MCG/ML (10ML) SYRINGE FOR IV PUSH (FOR BLOOD PRESSURE SUPPORT)
PREFILLED_SYRINGE | INTRAVENOUS | Status: AC
  Filled 2020-04-22: qty 20

## 2020-04-22 MED ORDER — FENTANYL CITRATE (PF) 100 MCG/2ML IJ SOLN
INTRAMUSCULAR | Status: AC
  Filled 2020-04-22: qty 2

## 2020-04-22 MED ORDER — SUGAMMADEX SODIUM 200 MG/2ML IV SOLN
INTRAVENOUS | Status: DC | PRN
  Administered 2020-04-22: 200 mg via INTRAVENOUS

## 2020-04-22 MED ORDER — SUCCINYLCHOLINE CHLORIDE 20 MG/ML IJ SOLN
INTRAMUSCULAR | Status: DC | PRN
  Administered 2020-04-22: 100 mg via INTRAVENOUS

## 2020-04-22 MED ORDER — OXYCODONE HCL 5 MG/5ML PO SOLN: 5.0000 mg | Freq: Once | ORAL | Status: DC | PRN

## 2020-04-22 MED ORDER — FENTANYL CITRATE (PF) 100 MCG/2ML IJ SOLN
INTRAMUSCULAR | Status: DC | PRN
Start: 2020-04-22 — End: 2020-04-22
  Administered 2020-04-22 (×2): 50 ug via INTRAVENOUS

## 2020-04-22 MED ORDER — LACTATED RINGERS IV SOLN: INTRAVENOUS | Status: DC

## 2020-04-22 MED ORDER — BELLADONNA ALKALOIDS-OPIUM 16.2-30 MG RE SUPP
RECTAL | Status: AC
  Filled 2020-04-22: qty 1

## 2020-04-22 MED ORDER — LIDOCAINE 2% (20 MG/ML) 5 ML SYRINGE
INTRAMUSCULAR | Status: DC | PRN
  Administered 2020-04-22: 100 mg via INTRAVENOUS

## 2020-04-22 MED ORDER — ONDANSETRON HCL 4 MG/2ML IJ SOLN
INTRAMUSCULAR | Status: DC | PRN
  Administered 2020-04-22: 4 mg via INTRAVENOUS

## 2020-04-22 MED ORDER — DOCUSATE SODIUM 100 MG PO CAPS
100.0000 mg | ORAL_CAPSULE | Freq: Two times a day (BID) | ORAL | Status: DC
  Administered 2020-04-22 – 2020-04-23 (×2): 100 mg via ORAL
  Filled 2020-04-22 (×2): qty 1

## 2020-04-22 MED ORDER — ONDANSETRON HCL 4 MG/2ML IJ SOLN
INTRAMUSCULAR | Status: AC
  Filled 2020-04-22: qty 2

## 2020-04-22 MED ORDER — ONDANSETRON HCL 4 MG/2ML IJ SOLN: 4.0000 mg | Freq: Once | INTRAMUSCULAR | Status: DC | PRN

## 2020-04-22 MED ORDER — CEFAZOLIN SODIUM-DEXTROSE 2-4 GM/100ML-% IV SOLN
2.0000 g | INTRAVENOUS | Status: AC
  Administered 2020-04-22: 2 g via INTRAVENOUS
  Filled 2020-04-22: qty 100

## 2020-04-22 MED ORDER — DEXAMETHASONE SODIUM PHOSPHATE 10 MG/ML IJ SOLN
INTRAMUSCULAR | Status: DC | PRN
  Administered 2020-04-22: 10 mg via INTRAVENOUS

## 2020-04-22 MED ORDER — SODIUM CHLORIDE 0.9 % IV SOLN: INTRAVENOUS | Status: DC

## 2020-04-22 SURGICAL SUPPLY — 27 items
BAG DRN RND TRDRP ANRFLXCHMBR (UROLOGICAL SUPPLIES) ×1
BAG URINE DRAIN 2000ML AR STRL (UROLOGICAL SUPPLIES) ×3 IMPLANT
BAG URO CATCHER STRL LF (MISCELLANEOUS) ×3 IMPLANT
CATH FOLEY 3WAY 30CC 24FR (CATHETERS) ×3
CATH HEMA 3WAY 30CC 22FR COUDE (CATHETERS) IMPLANT
CATH INTERMIT  6FR 70CM (CATHETERS) ×3 IMPLANT
CATH URET 5FR 28IN CONE TIP (BALLOONS)
CATH URET 5FR 70CM CONE TIP (BALLOONS) IMPLANT
CATH URTH STD 24FR FL 3W 2 (CATHETERS) IMPLANT
CLOTH BEACON ORANGE TIMEOUT ST (SAFETY) ×3 IMPLANT
EVACUATOR MICROVAS BLADDER (UROLOGICAL SUPPLIES) ×2 IMPLANT
GLOVE BIOGEL M STRL SZ7.5 (GLOVE) ×3 IMPLANT
GOWN STRL REUS W/TWL LRG LVL3 (GOWN DISPOSABLE) ×6 IMPLANT
GUIDEWIRE STR DUAL SENSOR (WIRE) ×3 IMPLANT
HOLDER FOLEY CATH W/STRAP (MISCELLANEOUS) ×2 IMPLANT
KIT TURNOVER KIT A (KITS) IMPLANT
LOOP CUT BIPOLAR 24F LRG (ELECTROSURGICAL) ×2 IMPLANT
MANIFOLD NEPTUNE II (INSTRUMENTS) ×3 IMPLANT
PACK CYSTO (CUSTOM PROCEDURE TRAY) ×3 IMPLANT
PENCIL SMOKE EVACUATOR (MISCELLANEOUS) IMPLANT
SYR 20ML LL LF (SYRINGE) ×3 IMPLANT
SYR 30ML LL (SYRINGE) ×3 IMPLANT
SYR TOOMEY IRRIG 70ML (MISCELLANEOUS) ×3
SYRINGE TOOMEY IRRIG 70ML (MISCELLANEOUS) ×1 IMPLANT
TUBING CONNECTING 10 (TUBING) ×2 IMPLANT
TUBING CONNECTING 10' (TUBING) ×1
TUBING UROLOGY SET (TUBING) ×3 IMPLANT

## 2020-04-22 NOTE — Anesthesia Postprocedure Evaluation (Signed)
Anesthesia Post Note  Patient: Cory Parker  Procedure(s) Performed: TRANSURETHRAL RESECTION OF THE PROSTATE (TURP) (N/A ) CYSTOSCOPY (N/A )     Patient location during evaluation: PACU Level of consciousness: awake and alert Pain management: pain level controlled Vital Signs Assessment: post-procedure vital signs reviewed and stable Respiratory status: spontaneous breathing, nonlabored ventilation and respiratory function stable Cardiovascular status: blood pressure returned to baseline and stable Postop Assessment: no apparent nausea or vomiting Anesthetic complications: no   No complications documented.  Last Vitals:  Vitals:   04/22/20 1430 04/22/20 1445  BP: (!) 163/82 (!) 149/83  Pulse: 70 72  Resp: 14 16  Temp: 36.7 C   SpO2: 97% 97%    Last Pain:  Vitals:   04/22/20 1430  TempSrc:   PainSc: 6                  Lidia Collum

## 2020-04-22 NOTE — Interval H&P Note (Signed)
History and Physical Interval Note:  04/22/2020 9:41 AM  Cory Parker  has presented today for surgery, with the diagnosis of BENIGN PROSTATIC HYPERPLASIA WITH RETENTION.  The various methods of treatment have been discussed with the patient and family. After consideration of risks, benefits and other options for treatment, the patient has consented to  Procedure(s) with comments: Anderson (TURP) (N/A) - 1 HR CYSTOSCOPY (N/A) as a surgical intervention.  The patient's history has been reviewed, patient examined, no change in status, stable for surgery.  I have reviewed the patient's chart and labs.  Questions were answered to the patient's satisfaction.     [name]

## 2020-04-22 NOTE — Transfer of Care (Signed)
Immediate Anesthesia Transfer of Care Note  Patient: Cory Parker  Procedure(s) Performed: TRANSURETHRAL RESECTION OF THE PROSTATE (TURP) (N/A ) CYSTOSCOPY (N/A )  Patient Location: PACU  Anesthesia Type:General  Level of Consciousness: sedated, patient cooperative and responds to stimulation  Airway & Oxygen Therapy: Patient Spontanous Breathing and Patient connected to face mask oxygen  Post-op Assessment: Report given to RN and Post -op Vital signs reviewed and stable  Post vital signs: Reviewed and stable  Last Vitals:  Vitals Value Taken Time  BP    Temp    Pulse 89 04/22/20 1338  Resp    SpO2 93 % 04/22/20 1338  Vitals shown include unvalidated device data.  Last Pain:  Vitals:   04/22/20 0953  TempSrc:   PainSc: 0-No pain         Complications: No complications documented.

## 2020-04-22 NOTE — Op Note (Signed)
Operative note  Preop diagnosis: BPH related urinary retention Postop diagnosis: BPH related urinary retention Procedure: Cystoscopy, transurethral resection of the prostate Surgeon: Milford Cage Anesthesia: General Estimated blood loss: Less than 50 cc Operative findings markedly enlarged prostate with trilobar prostatic hypertrophy.  TURP performed without difficulty.  41 French three-way Foley placed on CBI effluent clear at termination of case Operative note: After obtaining for consent for the patient is taken the major cystoscopy suite placed under general anesthesia.  He is placed in the dorsolithotomy position genitalia prepped and draped in usual sterile fashion.  Proper pause and timeout was performed prior to the procedure.  The 35 Fransico was advanced into the bladder without difficulty.  He was noted to have to have a markedly enlarged prostate with trilobar prostatic hypertrophy and approximately 3-1/2 cm prostatic urethral length.  Ureteral orifices were well away from the bladder neck.  Attention was then directed towards resection of the prostate.  The cystoscope was removed 24 French bipolar resectoscope was placed under direct vision without difficulty.  Utilizing 30 degree loop the median lobe was taken down to the level of the bladder neck proper.  The anterior commissure was then released allowing the lateral lip to fall into the resection area.  The prostate lateral lobes were then resected from the 11:00 to 7:00 positions from the bladder neck to the level just proximal to the verumontanum taking care to spare the area of the striated external sphincter.  In a similar fashion the left lateral lobe was resected from the bladder neck to the level of the verumontanum from the 1:00 to 5:00 positions creating a nice open channel in the prostate urethra.  Care was taken not to undermine the bladder neck because of the median lobe.  The prostatic chips were then irrigated from the bladder with  the suction evacuator.  Second look in the bladder revealed no remaining chips resection site was well clear of the ureteral orifice ease.  Hemostasis was obtained with cautery loop and the resectoscope was removed.  73 French three-way Foley was placed to light CBI irrigation and the effluent was clear at termination of the case.  He was awakened from anesthesia and taken back to the recovery room in stable condition.  No immediate complication from the procedure.

## 2020-04-22 NOTE — Anesthesia Procedure Notes (Addendum)
Procedure Name: Intubation Performed by: Gean Maidens, CRNA Pre-anesthesia Checklist: Patient identified, Emergency Drugs available, Suction available, Patient being monitored and Timeout performed Patient Re-evaluated:Patient Re-evaluated prior to induction Oxygen Delivery Method: Circle system utilized Preoxygenation: Pre-oxygenation with 100% oxygen Induction Type: IV induction Ventilation: Mask ventilation without difficulty Laryngoscope Size: Mac and 4 Grade View: Grade II Tube size: 7.5 mm Airway Equipment and Method: Stylet Placement Confirmation: ETT inserted through vocal cords under direct vision,  positive ETCO2 and breath sounds checked- equal and bilateral Secured at: 23 cm Tube secured with: Tape Dental Injury: Teeth and Oropharynx as per pre-operative assessment  Comments: DLx1 SRNA DLx1 CRNA grade 3 view, DL x2 MDA grade 2 view with successful intubation

## 2020-04-22 NOTE — Anesthesia Preprocedure Evaluation (Addendum)
Anesthesia Evaluation  Patient identified by MRN, date of birth, ID band Patient awake    Reviewed: Allergy & Precautions, NPO status , Patient's Chart, lab work & pertinent test results  History of Anesthesia Complications Negative for: history of anesthetic complications  Airway Mallampati: II  TM Distance: >3 FB Neck ROM: Full    Dental  (+) Teeth Intact   Pulmonary neg pulmonary ROS,    Pulmonary exam normal        Cardiovascular negative cardio ROS Normal cardiovascular exam     Neuro/Psych negative neurological ROS  negative psych ROS   GI/Hepatic negative GI ROS, Neg liver ROS,   Endo/Other  negative endocrine ROS  Renal/GU negative Renal ROS   BPH    Musculoskeletal negative musculoskeletal ROS (+)   Abdominal   Peds  Hematology negative hematology ROS (+)   Anesthesia Other Findings   Reproductive/Obstetrics                            Anesthesia Physical Anesthesia Plan  ASA: II  Anesthesia Plan: General   Post-op Pain Management:    Induction: Intravenous  PONV Risk Score and Plan: 2 and Ondansetron, Dexamethasone, Midazolam and Treatment may vary due to age or medical condition  Airway Management Planned: LMA  Additional Equipment: None  Intra-op Plan:   Post-operative Plan: Extubation in OR  Informed Consent: I have reviewed the patients History and Physical, chart, labs and discussed the procedure including the risks, benefits and alternatives for the proposed anesthesia with the patient or authorized representative who has indicated his/her understanding and acceptance.     Dental advisory given  Plan Discussed with:   Anesthesia Plan Comments:         Anesthesia Quick Evaluation

## 2020-04-23 ENCOUNTER — Encounter (HOSPITAL_COMMUNITY): Payer: Self-pay | Admitting: Urology

## 2020-04-23 DIAGNOSIS — R338 Other retention of urine: Secondary | ICD-10-CM | POA: Diagnosis not present

## 2020-04-23 DIAGNOSIS — N400 Enlarged prostate without lower urinary tract symptoms: Secondary | ICD-10-CM | POA: Diagnosis not present

## 2020-04-23 LAB — SURGICAL PATHOLOGY

## 2020-04-23 NOTE — Discharge Summary (Signed)
Physician Discharge Summary  Patient ID: Cory Parker MRN: [id number] DOB/AGE: January 05, 1941 79 y.o.  Admit date: 04/22/2020 Discharge date: 04/23/2020  Admission Diagnoses: BPH with urinary retention  Discharge Diagnoses:  Active Problems:   BPH (benign prostatic hyperplasia)   Discharged Condition: good  Hospital Course: Patient admitted to the hospital following cystoscopy and TURP on 04/22/2020.  Patient had continuous bladder irrigation overnight but urine cleared and this was weaned overnight.  Continuous bladder irrigation discontinued and urine remained light pink to clear.  He is felt ready for discharge home.  To be discharged home on routine preoperative medications.  He will be scheduled to follow-up in 1 week for Foley removal.  Consults: None  Significant Diagnostic Studies: {  Treatments: surgery: TURP  Discharge Exam: Blood pressure 122/64, pulse 72, temperature 97.8 F (36.6 C), temperature source Oral, resp. rate 17, height 5' 10.5" (1.791 m), weight 103.4 kg, SpO2 94 %. General appearance: alert, cooperative and no distress  Disposition: Discharge disposition: 01-Home or Self Care       Discharge Instructions    Care order/instruction   Complete by: As directed    Place catheter plug in the irrigation port of three-way Foley and discontinue continuous bladder irrigation.  If urine remains light pink to clear after breakfast this morning patient may be discharged home with Foley catheter     Allergies as of 04/23/2020      Reactions   Codeine Other (See Comments)   unknown      Medication List    STOP taking these medications   tamsulosin 0.4 MG Caps capsule Commonly known as: FLOMAX     TAKE these medications   azithromycin 250 MG tablet Commonly known as: ZITHROMAX Take 2 tabs today, then 1 tab daily x 4 days   benzonatate 200 MG capsule Commonly known as: TESSALON Take 1 capsule (200 mg total) by mouth 3 (three) times daily as needed  for cough.   Cholecalciferol 25 MCG (1000 UT) tablet Take 1,000 Units by mouth daily.   finasteride 5 MG tablet Commonly known as: PROSCAR Take 5 mg by mouth daily.   multivitamin with minerals tablet Take 1 tablet by mouth daily.   predniSONE 10 MG tablet Commonly known as: DELTASONE Take 3 tabs on days 1-2, take 2 tabs on days 3-4, take 1 tab on days 5-6   vitamin C 500 MG tablet Commonly known as: ASCORBIC ACID Take 1,000 mg by mouth daily.        Signed: [name] 04/23/2020, 6:49 AM

## 2020-04-23 NOTE — Progress Notes (Signed)
D/c instructions reviewed w/ pt. Pt verbalizes understanding and all questions answered. Pt has had foley at home x 2.5 months but foley care reviewed with pt again. Pt d/c in w/c by RN staff to wife's car. Pt in possession of d/c packet and all personal belongings. Pt in stable condition at time of d/c and eager to go home.

## 2020-04-23 NOTE — Plan of Care (Signed)
  Problem: Education: Goal: Knowledge of the prescribed therapeutic regimen will improve Outcome: Completed/Met   Problem: Bowel/Gastric: Goal: Gastrointestinal status for postoperative course will improve Outcome: Completed/Met   Problem: Health Behavior/Discharge Planning: Goal: Identification of resources available to assist in meeting health care needs will improve Outcome: Completed/Met   Problem: Skin Integrity: Goal: Demonstration of wound healing without infection will improve Outcome: Completed/Met   Problem: Urinary Elimination: Goal: Ability to avoid or minimize complications of infection will improve Outcome: Completed/Met

## 2020-05-09 ENCOUNTER — Ambulatory Visit: Payer: Medicare Other | Admitting: Primary Care

## 2020-05-13 DIAGNOSIS — R3914 Feeling of incomplete bladder emptying: Secondary | ICD-10-CM | POA: Diagnosis not present

## 2020-05-13 DIAGNOSIS — R3912 Poor urinary stream: Secondary | ICD-10-CM | POA: Diagnosis not present

## 2020-05-15 ENCOUNTER — Ambulatory Visit (INDEPENDENT_AMBULATORY_CARE_PROVIDER_SITE_OTHER): Payer: Medicare Other

## 2020-05-15 DIAGNOSIS — Z23 Encounter for immunization: Secondary | ICD-10-CM | POA: Diagnosis not present

## 2020-07-17 DIAGNOSIS — R3914 Feeling of incomplete bladder emptying: Secondary | ICD-10-CM | POA: Diagnosis not present

## 2020-07-17 DIAGNOSIS — R350 Frequency of micturition: Secondary | ICD-10-CM | POA: Diagnosis not present

## 2020-08-11 DIAGNOSIS — R8279 Other abnormal findings on microbiological examination of urine: Secondary | ICD-10-CM | POA: Diagnosis not present

## 2020-08-11 DIAGNOSIS — R3914 Feeling of incomplete bladder emptying: Secondary | ICD-10-CM | POA: Diagnosis not present

## 2020-09-08 DIAGNOSIS — R338 Other retention of urine: Secondary | ICD-10-CM | POA: Diagnosis not present

## 2020-09-08 DIAGNOSIS — R8279 Other abnormal findings on microbiological examination of urine: Secondary | ICD-10-CM | POA: Diagnosis not present

## 2020-10-09 DIAGNOSIS — H903 Sensorineural hearing loss, bilateral: Secondary | ICD-10-CM | POA: Diagnosis not present

## 2021-01-14 DIAGNOSIS — H2513 Age-related nuclear cataract, bilateral: Secondary | ICD-10-CM | POA: Diagnosis not present

## 2021-03-12 DIAGNOSIS — R3914 Feeling of incomplete bladder emptying: Secondary | ICD-10-CM | POA: Diagnosis not present

## 2021-04-06 ENCOUNTER — Telehealth: Payer: Self-pay

## 2021-04-06 ENCOUNTER — Other Ambulatory Visit: Payer: Self-pay

## 2021-04-06 DIAGNOSIS — I872 Venous insufficiency (chronic) (peripheral): Secondary | ICD-10-CM

## 2021-04-06 NOTE — Telephone Encounter (Signed)
Patient last seen in 09/2019 for venous disease and calls today to schedule follow up. Says he is having some knee pain but really no other issues. Advised that knee pain was likely unrelated. He would like a visit just to see where things are and says his right leg was more troublesome than the left. Added on to CSD December schedule with a RLE venous reflux study.

## 2021-04-13 ENCOUNTER — Telehealth: Payer: Self-pay | Admitting: Primary Care

## 2021-04-13 NOTE — Telephone Encounter (Signed)
Pt Called stating he has pain in right knee,and wanting a recommendation with an orthopedic doctor

## 2021-04-13 NOTE — Telephone Encounter (Signed)
Pt missed his call back. Please advise pt

## 2021-04-13 NOTE — Telephone Encounter (Signed)
Left message to return call to our office.  Last office visit was 2/21 we will need to see in office to do referral.

## 2021-04-13 NOTE — Telephone Encounter (Signed)
LMTCB to schedule an appt for a referral

## 2021-04-16 ENCOUNTER — Ambulatory Visit (INDEPENDENT_AMBULATORY_CARE_PROVIDER_SITE_OTHER): Payer: Medicare Other | Admitting: Primary Care

## 2021-04-16 ENCOUNTER — Encounter: Payer: Self-pay | Admitting: Primary Care

## 2021-04-16 ENCOUNTER — Other Ambulatory Visit: Payer: Self-pay

## 2021-04-16 ENCOUNTER — Ambulatory Visit (INDEPENDENT_AMBULATORY_CARE_PROVIDER_SITE_OTHER): Payer: Medicare Other

## 2021-04-16 VITALS — BP 124/68 | HR 68 | Temp 98.3°F | Ht 70.5 in | Wt 229.0 lb

## 2021-04-16 DIAGNOSIS — Z0001 Encounter for general adult medical examination with abnormal findings: Secondary | ICD-10-CM

## 2021-04-16 DIAGNOSIS — M25561 Pain in right knee: Secondary | ICD-10-CM

## 2021-04-16 DIAGNOSIS — R972 Elevated prostate specific antigen [PSA]: Secondary | ICD-10-CM

## 2021-04-16 DIAGNOSIS — I872 Venous insufficiency (chronic) (peripheral): Secondary | ICD-10-CM

## 2021-04-16 DIAGNOSIS — N400 Enlarged prostate without lower urinary tract symptoms: Secondary | ICD-10-CM | POA: Diagnosis not present

## 2021-04-16 LAB — CBC
HCT: 46.3 % (ref 39.0–52.0)
Hemoglobin: 15.9 g/dL (ref 13.0–17.0)
MCHC: 34.3 g/dL (ref 30.0–36.0)
MCV: 91.3 fl (ref 78.0–100.0)
Platelets: 156 10*3/uL (ref 150.0–400.0)
RBC: 5.07 Mil/uL (ref 4.22–5.81)
RDW: 13.2 % (ref 11.5–15.5)
WBC: 6.5 10*3/uL (ref 4.0–10.5)

## 2021-04-16 LAB — COMPREHENSIVE METABOLIC PANEL
ALT: 12 U/L (ref 0–53)
AST: 15 U/L (ref 0–37)
Albumin: 4.2 g/dL (ref 3.5–5.2)
Alkaline Phosphatase: 73 U/L (ref 39–117)
BUN: 20 mg/dL (ref 6–23)
CO2: 28 mEq/L (ref 19–32)
Calcium: 9.2 mg/dL (ref 8.4–10.5)
Chloride: 105 mEq/L (ref 96–112)
Creatinine, Ser: 1.06 mg/dL (ref 0.40–1.50)
GFR: 67.3 mL/min (ref >60.00)
Glucose, Bld: 99 mg/dL (ref 70–99)
Potassium: 4.4 mEq/L (ref 3.5–5.1)
Sodium: 138 mEq/L (ref 135–145)
Total Bilirubin: 0.9 mg/dL (ref 0.2–1.2)
Total Protein: 6.4 g/dL (ref 6.0–8.3)

## 2021-04-16 LAB — LIPID PANEL
Cholesterol: 154 mg/dL (ref 0–200)
HDL: 48.2 mg/dL (ref >39.00)
LDL Cholesterol: 84 mg/dL (ref 0–99)
NonHDL: 106.08
Total CHOL/HDL Ratio: 3
Triglycerides: 109 mg/dL (ref 0.0–149.0)
VLDL: 21.8 mg/dL (ref 0.0–40.0)

## 2021-04-16 NOTE — Assessment & Plan Note (Signed)
Immunizations UTD. PSA UTD, follows with Urology. No colonoscopy needed given age.  Discussed the importance of a healthy diet and regular exercise in order for weight loss, and to reduce the risk of further co-morbidity.  Exam today stable. Labs pending.

## 2021-04-16 NOTE — Assessment & Plan Note (Signed)
Normal level in September 2022 per patient, follows with Urology.   Elevated levels secondary to BPH.

## 2021-04-16 NOTE — Assessment & Plan Note (Signed)
Following with vascular surgery. Continue compression socks.

## 2021-04-16 NOTE — Patient Instructions (Addendum)
Stop by the lab and xray prior to leaving today. I will notify you of your results once received.   It was a pleasure to see you today!  Preventive Care 78 Years and Older, Male Preventive care refers to lifestyle choices and visits with your health care provider that can promote health and wellness. This includes: A yearly physical exam. This is also called an annual wellness visit. Regular dental and eye exams. Immunizations. Screening for certain conditions. Healthy lifestyle choices, such as: Eating a healthy diet. Getting regular exercise. Not using drugs or products that contain nicotine and tobacco. Limiting alcohol use. What can I expect for my preventive care visit? Physical exam Your health care provider will check your: Height and weight. These may be used to calculate your BMI (body mass index). BMI is a measurement that tells if you are at a healthy weight. Heart rate and blood pressure. Body temperature. Skin for abnormal spots. Counseling Your health care provider may ask you questions about your: Past medical problems. Family's medical history. Alcohol, tobacco, and drug use. Emotional well-being. Home life and relationship well-being. Sexual activity. Diet, exercise, and sleep habits. History of falls. Memory and ability to understand (cognition). Work and work Statistician. Access to firearms. What immunizations do I need? Vaccines are usually given at various ages, according to a schedule. Your health care provider will recommend vaccines for you based on your age, medical history, and lifestyle or other factors, such as travel or where you work. What tests do I need? Blood tests Lipid and cholesterol levels. These may be checked every 5 years, or more often depending on your overall health. Hepatitis C test. Hepatitis B test. Screening Lung cancer screening. You may have this screening every year starting at age 12 if you have a 30-pack-year history of  smoking and currently smoke or have quit within the past 15 years. Colorectal cancer screening. All adults should have this screening starting at age 24 and continuing until age 30. Your health care provider may recommend screening at age 43 if you are at increased risk. You will have tests every 1-10 years, depending on your results and the type of screening test. Prostate cancer screening. Recommendations will vary depending on your family history and other risks. Genital exam to check for testicular cancer or hernias. Diabetes screening. This is done by checking your blood sugar (glucose) after you have not eaten for a while (fasting). You may have this done every 1-3 years. Abdominal aortic aneurysm (AAA) screening. You may need this if you are a current or former smoker. STD (sexually transmitted disease) testing, if you are at risk. Follow these instructions at home: Eating and drinking  Eat a diet that includes fresh fruits and vegetables, whole grains, lean protein, and low-fat dairy products. Limit your intake of foods with high amounts of sugar, saturated fats, and salt. Take vitamin and mineral supplements as recommended by your health care provider. Do not drink alcohol if your health care provider tells you not to drink. If you drink alcohol: Limit how much you have to 0-2 drinks a day. Be aware of how much alcohol is in your drink. In the [place] one drink equals one 12 oz bottle of beer (355 mL), one 5 oz glass of wine (148 mL), or one 1 oz glass of hard liquor (44 mL). Lifestyle Take daily care of your teeth and gums. Brush your teeth every morning and night with fluoride toothpaste. Floss one time each day. Stay active.  Exercise for at least 30 minutes 5 or more days each week. Do not use any products that contain nicotine or tobacco, such as cigarettes, e-cigarettes, and chewing tobacco. If you need help quitting, ask your health care provider. Do not use drugs. If you are  sexually active, practice safe sex. Use a condom or other form of protection to prevent STIs (sexually transmitted infections). Talk with your health care provider about taking a low-dose aspirin or statin. Find healthy ways to cope with stress, such as: Meditation, yoga, or listening to music. Journaling. Talking to a trusted person. Spending time with friends and family. Safety Always wear your seat belt while driving or riding in a vehicle. Do not drive: If you have been drinking alcohol. Do not ride with someone who has been drinking. When you are tired or distracted. While texting. Wear a helmet and other protective equipment during sports activities. If you have firearms in your house, make sure you follow all gun safety procedures. What's next? Visit your health care provider once a year for an annual wellness visit. Ask your health care provider how often you should have your eyes and teeth checked. Stay up to date on all vaccines. This information is not intended to replace advice given to you by your health care provider. Make sure you discuss any questions you have with your health care provider. Document Revised: 08/29/2020 Document Reviewed: 06/15/2018 Elsevier Patient Education  2022 [employer].

## 2021-04-16 NOTE — Assessment & Plan Note (Addendum)
S/p TURP in October 2021. Doing well since.  Continue finasteride 5 mg.   Following with [facility]. PSA is UTD per patient.

## 2021-04-16 NOTE — Progress Notes (Signed)
Subjective:    Patient ID: Cory Parker, male    DOB: 07-04-1941, 80 y.o.   MRN: [id number]  HPI  Cory Parker is a very pleasant 80 y.o. male who presents today for complete physical and follow up of chronic conditions.  He would also like to discuss acute right knee pain. His pain is located to the medial right knee which began 1 month ago after crawling around on hardwood floors while moving some equipment. His pain is present with standing, walking upstairs, and walking. He's not taken anything for symptoms. He's notices mild swelling. Denies erythema.   Immunizations: -Tetanus: 2015 -Influenza: Completed yesterday  -Covid-19: 2 vaccines -Shingles: Zostavax in 2013, has also completed Shingrix -Pneumonia: Prevnar 13 in 2015, Pneumovax 23 in 2014   Diet: Marathon.  Exercise: No regular exercise.  Eye exam: Completes annually  Dental exam: Completes semi-annually   Colonoscopy: Completed in 2010, no further imaging required.  PSA: UTD. Follows with Urology. TURP in October 2021.  BP Readings from Last 3 Encounters:  04/16/21 124/68  04/23/20 122/64  04/16/20 (!) 163/81        Review of Systems  Constitutional:  Negative for unexpected weight change.  HENT:  Negative for rhinorrhea.   Eyes:  Negative for visual disturbance.  Respiratory:  Negative for cough and shortness of breath.   Cardiovascular:  Negative for chest pain.  Gastrointestinal:  Negative for constipation and diarrhea.  Genitourinary:  Negative for difficulty urinating.  Musculoskeletal:  Positive for arthralgias. Negative for myalgias.       Acute right knee pain  Skin:  Negative for rash.  Allergic/Immunologic: Negative for environmental allergies.  Neurological:  Negative for dizziness and headaches.  Psychiatric/Behavioral:  The patient is not nervous/anxious.         Past Medical History:  Diagnosis Date   History of bronchitis    Varicose veins     Social History    Socioeconomic History   Marital status: Married    Spouse name: Not on file   Number of children: Not on file   Years of education: Not on file   Highest education level: Not on file  Occupational History   Not on file  Tobacco Use   Smoking status: Never   Smokeless tobacco: Never  Vaping Use   Vaping Use: Never used  Substance and Sexual Activity   Alcohol use: Yes    Alcohol/week: 1.0 - 2.0 standard drink    Types: 1 - 2 Cans of beer per week   Drug use: No   Sexual activity: Not on file  Other Topics Concern   Not on file  Social History Narrative   Not on file   Social Determinants of Health   Financial Resource Strain: Not on file  Food Insecurity: Not on file  Transportation Needs: Not on file  Physical Activity: Not on file  Stress: Not on file  Social Connections: Not on file  Intimate Partner Violence: Not on file    Past Surgical History:  Procedure Laterality Date   APPENDECTOMY     CYSTOSCOPY N/A 04/22/2020   Procedure: CYSTOSCOPY;  Surgeon: [name], MD;  Location: [facility];  Service: Urology;  Laterality: N/A;   finger torn tendon laceration     TONSILLECTOMY     TRANSURETHRAL RESECTION OF PROSTATE N/A 04/22/2020   Procedure: TRANSURETHRAL RESECTION OF THE PROSTATE (TURP);  Surgeon: [name], MD;  Location: [facility];  Service: Urology;  Laterality:  N/A;  1 HR    Family History  Problem Relation Age of Onset   Heart attack Father    Kidney disease Mother    Heart disease Mother     Allergies  Allergen Reactions   Codeine Other (See Comments)    unknown    Current Outpatient Medications on File Prior to Visit  Medication Sig Dispense Refill   Cholecalciferol 25 MCG (1000 UT) tablet Take 1,000 Units by mouth daily.      finasteride (PROSCAR) 5 MG tablet Take 5 mg by mouth daily.     Multiple Vitamins-Minerals (MULTIVITAMIN WITH MINERALS) tablet Take 1 tablet by mouth daily.     vitamin C (ASCORBIC ACID) 500 MG tablet Take  1,000 mg by mouth daily.      azithromycin (ZITHROMAX) 250 MG tablet Take 2 tabs today, then 1 tab daily x 4 days 6 tablet 0   No current facility-administered medications on file prior to visit.    BP 124/68   Pulse 68   Temp 98.3 F (36.8 C) (Temporal)   Ht 5' 10.5" (1.791 m)   Wt 229 lb (103.9 kg)   SpO2 98%   BMI 32.39 kg/m  Objective:   Physical Exam HENT:     Right Ear: Tympanic membrane and ear canal normal.     Left Ear: Tympanic membrane and ear canal normal.     Nose: Nose normal.     Right Sinus: No maxillary sinus tenderness or frontal sinus tenderness.     Left Sinus: No maxillary sinus tenderness or frontal sinus tenderness.  Eyes:     Conjunctiva/sclera: Conjunctivae normal.  Neck:     Thyroid: No thyromegaly.     Vascular: No carotid bruit.  Cardiovascular:     Rate and Rhythm: Normal rate and regular rhythm.     Heart sounds: Normal heart sounds.  Pulmonary:     Effort: Pulmonary effort is normal.     Breath sounds: Normal breath sounds. No wheezing or rales.  Abdominal:     General: Bowel sounds are normal.     Palpations: Abdomen is soft.     Tenderness: There is no abdominal tenderness.  Musculoskeletal:        General: Normal range of motion.     Cervical back: Neck supple.     Right knee: Swelling present. No erythema, bony tenderness or crepitus. Normal range of motion. No tenderness.     Left knee: No swelling, erythema, bony tenderness or crepitus. Normal range of motion. No tenderness.       Legs:     Comments: Mild swelling noted to right medial knee.   Skin:    General: Skin is warm and dry.  Neurological:     Mental Status: He is alert and oriented to person, place, and time.     Cranial Nerves: No cranial nerve deficit.     Deep Tendon Reflexes: Reflexes are normal and symmetric.  Psychiatric:        Mood and Affect: Mood normal.          Assessment & Plan:      This visit occurred during the SARS-CoV-2 public health  emergency.  Safety protocols were in place, including screening questions prior to the visit, additional usage of staff PPE, and extensive cleaning of exam room while observing appropriate contact time as indicated for disinfecting solutions.

## 2021-04-17 NOTE — Telephone Encounter (Signed)
Patient has been seen in office. No further action needed at this time.

## 2021-06-17 ENCOUNTER — Other Ambulatory Visit: Payer: Self-pay

## 2021-06-17 ENCOUNTER — Encounter: Payer: Self-pay | Admitting: Vascular Surgery

## 2021-06-17 ENCOUNTER — Ambulatory Visit: Payer: Medicare Other | Admitting: Vascular Surgery

## 2021-06-17 ENCOUNTER — Ambulatory Visit (HOSPITAL_COMMUNITY)
Admission: RE | Admit: 2021-06-17 | Discharge: 2021-06-17 | Disposition: A | Discharge status: Home / Self Care | Payer: Medicare Other | Source: Ambulatory Visit | Attending: Vascular Surgery | Admitting: Vascular Surgery

## 2021-06-17 VITALS — BP 120/61 | HR 65 | Temp 98.1°F | Resp 18 | Ht 70.0 in | Wt 230.1 lb

## 2021-06-17 DIAGNOSIS — I872 Venous insufficiency (chronic) (peripheral): Secondary | ICD-10-CM | POA: Insufficient documentation

## 2021-06-17 NOTE — Progress Notes (Signed)
REASON FOR VISIT:   Follow-up of chronic venous insufficiency.  MEDICAL ISSUES:   PHLEBITIS RIGHT LOWER EXTREMITY: This patient has some mild phlebitis at his right distal thigh adjacent to the knee.  We have discussed the importance of leg elevation, warm compresses, and ibuprofen as needed for pain.  He does have hyperpigmentation bilaterally but is having no significant symptoms otherwise.  He has had previous laser ablation of the right great saphenous vein.  I explained if his symptoms progress on the left we would need to reevaluate his venous system on the left.  He will call if he develops any new symptoms.  We have again discussed the importance of daily leg elevation.  He will continue to wear his knee-high compression stockings with a gradient of 15 to 20 mmHg.  He remains fairly active.  He will call if things change.   HPI:   Cory Parker is a pleasant 80 y.o. male who I last saw on 09/13/2019.  He has CEAP C4 venous disease.  He has had prior ablation of the right great saphenous vein.  At that time his symptoms were tolerable we discussed conservative measures for treatment of his venous insufficiency.  He was going to call if his symptoms progressed.  He was working on his computer and was kneeling on his right knee for some time.  He subsequently developed some pain adjacent to the right knee on the medial side.  This is where he has a cluster of some small varicose veins.  Comes in today to have this checked out.  He denies significant leg swelling.  Has had no significant symptoms on the left side.  He has had previous laser ablation of the right great saphenous vein I believe by Dr. Kellie Simmering.   Past Medical History:  Diagnosis Date   History of bronchitis    Varicose veins     Family History  Problem Relation Age of Onset   Heart attack Father    Kidney disease Mother    Heart disease Mother     SOCIAL HISTORY: Social History   Tobacco Use   Smoking  status: Never   Smokeless tobacco: Never  Substance Use Topics   Alcohol use: Yes    Alcohol/week: 1.0 - 2.0 standard drink    Types: 1 - 2 Cans of beer per week    Allergies  Allergen Reactions   Codeine Other (See Comments)    unknown    Current Outpatient Medications  Medication Sig Dispense Refill   Cholecalciferol 25 MCG (1000 UT) tablet Take 1,000 Units by mouth daily.      finasteride (PROSCAR) 5 MG tablet Take 5 mg by mouth daily.     Multiple Vitamins-Minerals (MULTIVITAMIN WITH MINERALS) tablet Take 1 tablet by mouth daily.     vitamin C (ASCORBIC ACID) 500 MG tablet Take 1,000 mg by mouth daily.      No current facility-administered medications for this visit.    REVIEW OF SYSTEMS:  [X]  denotes positive finding, [ ]  denotes negative finding Cardiac  Comments:  Chest pain or chest pressure:    Shortness of breath upon exertion:    Short of breath when lying flat:    Irregular heart rhythm:        Vascular    Pain in calf, thigh, or hip brought on by ambulation:    Pain in feet at night that wakes you up from your sleep:     Blood clot in  your veins:    Leg swelling:         Pulmonary    Oxygen at home:    Productive cough:     Wheezing:         Neurologic    Sudden weakness in arms or legs:     Sudden numbness in arms or legs:     Sudden onset of difficulty speaking or slurred speech:    Temporary loss of vision in one eye:     Problems with dizziness:         Gastrointestinal    Blood in stool:     Vomited blood:         Genitourinary    Burning when urinating:     Blood in urine:        Psychiatric    Major depression:         Hematologic    Bleeding problems:    Problems with blood clotting too easily:        Skin    Rashes or ulcers:        Constitutional    Fever or chills:     PHYSICAL EXAM:   Vitals:   06/17/21 1511  BP: 120/61  Pulse: 65  Resp: 18  Temp: 98.1 F (36.7 C)  TempSrc: Temporal  SpO2: 96%  Weight: 230 lb  1.6 oz (104.4 kg)  Height: 5\' 10"  (1.778 m)    GENERAL: The patient is a well-nourished male, in no acute distress. The vital signs are documented above. CARDIAC: There is a regular rate and rhythm.  VASCULAR: I do not detect carotid bruits. He has mild swelling bilaterally with hyperpigmentation. He has a cluster of varicose veins where he had some tenderness as described above.   PULMONARY: There is good air exchange bilaterally without wheezing or rales. ABDOMEN: Soft and non-tender with normal pitched bowel sounds.  MUSCULOSKELETAL: There are no major deformities or cyanosis. NEUROLOGIC: No focal weakness or paresthesias are detected. SKIN: There are no ulcers or rashes noted. PSYCHIATRIC: The patient has a normal affect.  DATA:    VENOUS DUPLEX: I have independently interpreted his venous duplex scan today.  This was of the right lower extremity only.  There was no evidence of DVT.  There was deep venous reflux involving the common femoral vein.  The right great saphenous vein has been previously ablated.  There was some reflux at the saphenofemoral junction only but no other significant superficial venous reflux on the right.  Deitra Mayo [facility] [phone]

## 2021-07-09 ENCOUNTER — Telehealth: Payer: Self-pay | Admitting: Primary Care

## 2021-07-09 NOTE — Telephone Encounter (Signed)
LVM for pt to rtn my call to schedule AWV with [facility].  

## 2021-09-09 DIAGNOSIS — R3914 Feeling of incomplete bladder emptying: Secondary | ICD-10-CM | POA: Diagnosis not present

## 2021-10-26 ENCOUNTER — Encounter: Payer: Self-pay | Admitting: Family Medicine

## 2021-10-26 ENCOUNTER — Ambulatory Visit (INDEPENDENT_AMBULATORY_CARE_PROVIDER_SITE_OTHER): Payer: Medicare Other | Admitting: Family Medicine

## 2021-10-26 VITALS — BP 140/88 | HR 56 | Temp 97.5°F | Ht 70.0 in | Wt 234.5 lb

## 2021-10-26 DIAGNOSIS — I872 Venous insufficiency (chronic) (peripheral): Secondary | ICD-10-CM

## 2021-10-26 DIAGNOSIS — S86811A Strain of other muscle(s) and tendon(s) at lower leg level, right leg, initial encounter: Secondary | ICD-10-CM | POA: Insufficient documentation

## 2021-10-26 NOTE — Assessment & Plan Note (Signed)
Story/exam consistent with calf strain. Supportive measures reviewed including tylenol and voltaren gel use, rec continued compression stocking use, provided with exercises from [facility] pt advisor on calf strain. rec f/u with Dr Lorelei Pont if not improving with treatment. Pt agrees with plan.  ?

## 2021-10-26 NOTE — Progress Notes (Signed)
? ? Patient ID: Cory Parker, male    DOB: [birth year], 81 y.o.   MRN: [id number] ? ?This visit was conducted in person. ? ?BP 140/88   Pulse (!) 56   Temp (!) 97.5 ?F (36.4 ?C) (Temporal)   Ht '5\' 10"'$  (1.778 m)   Wt 234 lb 8 oz (106.4 kg)   SpO2 96%   BMI 33.65 kg/m?   ? ?CC: R leg pain  ?Subjective:  ? ?HPI: ?Cory Parker is a 81 y.o. male presenting on 10/26/2021 for Leg Pain (C/o R leg pain.  Started about 3 wks ago.  Thought he pulled it but now pain has moved to IT band. ) ? ? ?3 wk h/o R leg pain without known inciting trauma/injury. It did start after waslking his own car - thinks he may have strained leg. Points to lateral R lower leg into calf. Also notes some shooting pain down lateral right leg. Worse symptoms at night time when turning over in bed - when laying on his right side. He's been using hip bursitis exercises. No pain with leg lifts or squats.  ?He wants to get back into playing golf - but notes worse pain with this as well.  ?He's treated with icy hot, salon pas, and K gel as well as tylenol at night time. He also does use compression stockings (15-20 mmHg). ?Notes some paresthesias to the foot. No numbness/weakness of leg, fevers/chills, or back pain, no new leg swelling.  ? ?Dx 06/2017 L hip bursitis, L gluteus medius sprain s/p bursal steroid injection with improvement. Had prolonged recovery course - took 8 wks to fully resolve.  ?Neg venous US 06/2017.  ?Known CVI - s/p laser ablation R great saphenous vein.  ?Last saw [facility] 07/2021 Scot Dock - had some superficial phlebitis  ?   ? ?Relevant past medical, surgical, family and social history reviewed and updated as indicated. Interim medical history since our last visit reviewed. ?Allergies and medications reviewed and updated. ?Outpatient Medications Prior to Visit  ?Medication Sig Dispense Refill  ? Cholecalciferol 25 MCG (1000 UT) tablet Take 1,000 Units by mouth daily.     ? finasteride (PROSCAR) 5 MG tablet Take 5 mg by mouth  daily.    ? Multiple Vitamins-Minerals (MULTIVITAMIN WITH MINERALS) tablet Take 1 tablet by mouth daily.    ? vitamin C (ASCORBIC ACID) 500 MG tablet Take 1,000 mg by mouth daily.     ? ?No facility-administered medications prior to visit.  ?  ? ?Per HPI unless specifically indicated in ROS section below ?Review of Systems ? ?Objective:  ?BP 140/88   Pulse (!) 56   Temp (!) 97.5 ?F (36.4 ?C) (Temporal)   Ht '5\' 10"'$  (1.778 m)   Wt 234 lb 8 oz (106.4 kg)   SpO2 96%   BMI 33.65 kg/m?   ?Wt Readings from Last 3 Encounters:  ?10/26/21 234 lb 8 oz (106.4 kg)  ?06/17/21 230 lb 1.6 oz (104.4 kg)  ?04/16/21 229 lb (103.9 kg)  ?  ?  ?Physical Exam ?Vitals and nursing note reviewed.  ?Constitutional:   ?   Appearance: Normal appearance. He is not ill-appearing.  ?Musculoskeletal:     ?   General: Tenderness present. No swelling.  ?   Right lower leg: Edema (1+) present.  ?   Left lower leg: Edema (1+) present.  ?   Comments:  ?Reproducible tenderness to palpation of lateral right calf, reproducible with active stretching of both gastroc and soleus  on right ?No pain at achilles or at knee   ?Skin: ?   General: Skin is warm and dry.  ?   Findings: No erythema or rash.  ?Neurological:  ?   Mental Status: He is alert.  ?Psychiatric:     ?   Mood and Affect: Mood normal.     ?   Behavior: Behavior normal.  ? ?   ? ?Assessment & Plan:  ? ?Problem List Items Addressed This Visit   ? ? Venous (peripheral) insufficiency  ? Strain of right calf muscle - Primary  ?  Story/exam consistent with calf strain. Supportive measures reviewed including tylenol and voltaren gel use, rec continued compression stocking use, provided with exercises from [facility] pt advisor on calf strain. rec f/u with Dr Lorelei Pont if not improving with treatment. Pt agrees with plan.  ? ?  ?  ?  ? ?No orders of the defined types were placed in this encounter. ? ?No orders of the defined types were placed in this encounter. ? ? ? ?Patient Instructions  ?I think you have  right calf strain.  ?Rest leg, elevate leg, continue compression stocking use.  ?Continue tylenol 1-2 pills 2-3 times a day as needed for discomfort. ?May use voltaren gel over the counter.  ?Do exercises provided today. ?If not improving over next 1-2 weeks, come see Dr Lorelei Pont.  ? ?Follow up plan: ?Return if symptoms worsen or fail to improve. ? ?Ria Bush, MD   ?

## 2021-10-26 NOTE — Patient Instructions (Signed)
I think you have right calf strain.  ?Rest leg, elevate leg, continue compression stocking use.  ?Continue tylenol 1-2 pills 2-3 times a day as needed for discomfort. ?May use voltaren gel over the counter.  ?Do exercises provided today. ?If not improving over next 1-2 weeks, come see Dr Lorelei Pont.  ?

## 2021-10-28 DIAGNOSIS — L821 Other seborrheic keratosis: Secondary | ICD-10-CM | POA: Diagnosis not present

## 2021-10-28 DIAGNOSIS — D225 Melanocytic nevi of trunk: Secondary | ICD-10-CM | POA: Diagnosis not present

## 2021-11-05 ENCOUNTER — Encounter: Payer: Self-pay | Admitting: Family Medicine

## 2021-11-05 ENCOUNTER — Ambulatory Visit (INDEPENDENT_AMBULATORY_CARE_PROVIDER_SITE_OTHER): Payer: Medicare Other | Admitting: Family Medicine

## 2021-11-05 VITALS — BP 140/66 | HR 56 | Temp 98.0°F | Ht 70.0 in | Wt 233.0 lb

## 2021-11-05 DIAGNOSIS — S86119D Strain of other muscle(s) and tendon(s) of posterior muscle group at lower leg level, unspecified leg, subsequent encounter: Secondary | ICD-10-CM | POA: Diagnosis not present

## 2021-11-05 NOTE — Progress Notes (Signed)
? ? ?Anaka Beazer T. Kenae Lindquist, MD, [facility] ?Therapist, music at [facility] ?[place] ?[place], [address] ? ?Phone: [phone]  FAX: [phone] ? ?Cory Parker - 81 y.o. male  MRN [id number]  Date of Birth: 1940/08/11 ? ?Date: 11/05/2021  PCP: [name], NP  Referral: [name], NP ? ?Chief Complaint  ?Patient presents with  ? Leg Pain  ?  Right  ? ? ?This visit occurred during the SARS-CoV-2 public health emergency.  Safety protocols were in place, including screening questions prior to the visit, additional usage of staff PPE, and extensive cleaning of exam room while observing appropriate contact time as indicated for disinfecting solutions.  ? ?Subjective:  ? ?Cory Parker is a 81 y.o. very pleasant male patient with Body mass index is 33.43 kg/m?. who presents with the following: ? ?Pleasant 81 year old gentleman, who presents with some right-sided leg pain. ? ?He saw my partner Dr. Danise Mina on October 26, 2021, at that point he did have some pain in the right calf. ? ?R lateral leg, sore and it hurts.  It will hurt for a couple of hours.  Tylenol will help with sleep. ? ?Lateral calf.  Sitting in a chair will hurt.  Will ache and grab when trying to hit his golf swing.  ? ?He never had any bruising.  He recalls an incident where he hurt his back mildly when he was washing his car, though this is not a ? ?Acute injury. ? ?He does not have any gap in the musculature. ? ?Review of Systems is noted in the HPI, as appropriate ? ?Objective:  ? ?BP 140/66   Pulse (!) 56   Temp 98 ?F (36.7 ?C) (Oral)   Ht '5\' 10"'$  (1.778 m)   Wt 233 lb (105.7 kg)   SpO2 95%   BMI 33.43 kg/m?  ? ?GEN: No acute distress; alert,appropriate. ?PULM: Breathing comfortably in no respiratory distress ?PSYCH: Normally interactive.  ? ?Walks with minimal limp.  He is able to stand on his tiptoes as well as balance on his heels. ? ?Nontender throughout the lower extremity in terms  of the tibia as well as all the way through the foot and ankle. ?He does have 1-2+ pitting edema, which is not new. ? ?Bilateral edema. ? ?Full range of motion at the knee with no pain to flexion and extension. ?No tenderness about the hamstring with flexion and extension at 5/5 ? ?No medial calf tenderness, no Achilles tenderness, and does have some tenderness at the lateral gastroc distal to the tendon. ? ?Laboratory and Imaging Data: ? ?Assessment and Plan:  ? ?  ICD-10-CM   ?1. Gastrocnemius strain, unspecified laterality, subsequent encounter  S86.119D   ?  ? ?Recovering from lateral gastroc strain.  We reviewed anatomy, and he would anticipate him to get better over the next few weeks without any aggressive rehab, but gentle stability and rehab of the calf. ? ?Patient Instructions  ?Will have a small incline, practice going forward and backward up a hill.   ? ?No more than 1 minute at a time. ? ?After forward and backwards, slowly step side to side going both ways. ? ?Start off no more than 10 minutes at a time. ?OK to increase roughly 2 minutes at a time as long as no pain.  ? ?Once you can get to about 25 minutes without problems, then I would get back to regular walking.  n ? ?No orders of  the defined types were placed in this encounter. ? ?There are no discontinued medications. ?No orders of the defined types were placed in this encounter. ? ? ?Follow-up: No follow-ups on file. ? ?[employer] One speech-to-text software was used for transcription in this dictation.  Possible transcriptional errors can occur using Editor, commissioning.  ? ?Signed, ? ?Kevontae Burgoon T. Felisha Claytor, MD ? ? ?Outpatient Encounter Medications as of 11/05/2021  ?Medication Sig  ? Cholecalciferol 25 MCG (1000 UT) tablet Take 1,000 Units by mouth daily.   ? finasteride (PROSCAR) 5 MG tablet Take 5 mg by mouth daily.  ? Multiple Vitamins-Minerals (MULTIVITAMIN WITH MINERALS) tablet Take 1 tablet by mouth daily.  ? vitamin C (ASCORBIC ACID) 500 MG  tablet Take 1,000 mg by mouth daily.   ? ?No facility-administered encounter medications on file as of 11/05/2021.  ?  ?

## 2021-11-05 NOTE — Patient Instructions (Signed)
Will have a small incline, practice going forward and backward up a hill.   ? ?No more than 1 minute at a time. ? ?After forward and backwards, slowly step side to side going both ways. ? ?Start off no more than 10 minutes at a time. ?OK to increase roughly 2 minutes at a time as long as no pain.  ? ?Once you can get to about 25 minutes without problems, then I would get back to regular walking.  ?

## 2021-11-18 ENCOUNTER — Telehealth: Payer: Self-pay | Admitting: Primary Care

## 2021-11-18 NOTE — Telephone Encounter (Signed)
Spoke with patient to schedule Medicare Annual Wellness Visit (AWV) either virtually or phone ? ? ?Last AWV  05/09/19 ?; please schedule at anytime with health coach ? ?Patient stated he will call back could not schedule right now ?

## 2022-01-13 ENCOUNTER — Ambulatory Visit (INDEPENDENT_AMBULATORY_CARE_PROVIDER_SITE_OTHER): Payer: Medicare Other | Admitting: Primary Care

## 2022-01-13 ENCOUNTER — Encounter: Payer: Self-pay | Admitting: Primary Care

## 2022-01-13 VITALS — BP 118/68 | HR 78 | Temp 99.0°F | Ht 70.0 in | Wt 233.4 lb

## 2022-01-13 DIAGNOSIS — S80861A Insect bite (nonvenomous), right lower leg, initial encounter: Secondary | ICD-10-CM | POA: Diagnosis not present

## 2022-01-13 DIAGNOSIS — M79661 Pain in right lower leg: Secondary | ICD-10-CM | POA: Diagnosis not present

## 2022-01-13 DIAGNOSIS — W57XXXA Bitten or stung by nonvenomous insect and other nonvenomous arthropods, initial encounter: Secondary | ICD-10-CM

## 2022-01-13 DIAGNOSIS — S70362A Insect bite (nonvenomous), left thigh, initial encounter: Secondary | ICD-10-CM

## 2022-01-13 NOTE — Assessment & Plan Note (Signed)
No obvious signs for Lyme disease at either tick bite site.  Discussed to stop using rubbing alcohol as this is likely prolonging healing.  Lab pending today for Lyme disease.

## 2022-01-13 NOTE — Progress Notes (Signed)
Subjective:    Patient ID: Cory Parker, male    DOB: [birth year], 81 y.o.   MRN: [id number]  HPI  Hartman Minahan is a very pleasant 81 y.o. male with a history of venous insufficiency, varicose veins of lower extremities, BPH who presents today to discuss tick bite and acute right calf pain.  1) Tick Bites: About 2 months ago, he noticed two ticks to his lower extremities, one to the left medial thigh and the other to the right anterior chin.  He was able to easily remove the tick to his left medial thigh with rubbing alcohol, but he could not remove the tick to the right anterior chin for three weeks.   Since then he's noticed residual dark, red spots. He's been applying rubbing alcohol daily without resolve to the sites. He denies a rash, fevers, chills. He does continue to experience muscle pain which began prior to the tick bites.   2) Chronic Calf Pain: Originally began in early April 2023. Evaluated by Dr. Danise Mina in late April 2023 for a 3-week history of right lower extremity pain, specifically to right lateral and lower leg into the calf that began shortly after he washed his car.  During this visit his symptoms were suspected to be secondary to a calf strain so he was advised to rest, elevate, use compression socks, take Tylenol.  Evaluated by Dr. Lorelei Pont in early May 2023 for continued right calf pain. HPI and exam were consistent for gastrocnemius strain so he was instructed to stretch and was provided some specific exercises.   Since his visit with Dr. Lorelei Pont he continues to notice his right lower extremity pain, specifically to the right calf.  He has been stretching and walking as recommended.  He continues to wear his compression socks.  Overall his symptoms have improved since April, but symptoms will wax and wane. His wife is concerned about a potential blood clot.   He has noticed intermittent numbness with pins and needles sensations to right lower extremity from the  ankle moving upward. This occurs intermittently when walking after rising from a seated position, lasts a few seconds.  He does experience intermittent posterior thigh pain, especially when rising from a seated position.  He denies long travel, increased lower extremity swelling, changes in medications.  He is not managed on statin therapy.  He does have chronic venous insufficiency and as a result chronic bilateral lower extremity swelling.   Review of Systems  Constitutional:  Negative for chills, fatigue and fever.  Musculoskeletal:  Positive for myalgias. Negative for joint swelling.  Skin:  Positive for color change.       Tick bites  Neurological:  Negative for headaches.         Past Medical History:  Diagnosis Date   History of bronchitis    Varicose veins     Social History   Socioeconomic History   Marital status: Married    Spouse name: Not on file   Number of children: Not on file   Years of education: Not on file   Highest education level: Not on file  Occupational History   Not on file  Tobacco Use   Smoking status: Never   Smokeless tobacco: Never  Vaping Use   Vaping Use: Never used  Substance and Sexual Activity   Alcohol use: Yes    Alcohol/week: 1.0 - 2.0 standard drink of alcohol    Types: 1 - 2 Cans of beer per week  Drug use: No   Sexual activity: Not on file  Other Topics Concern   Not on file  Social History Narrative   Not on file   Social Determinants of Health   Financial Resource Strain: Low Risk  (05/09/2019)   Overall Financial Resource Strain (CARDIA)    Difficulty of Paying Living Expenses: Not hard at all  Food Insecurity: No Food Insecurity (05/09/2019)   Hunger Vital Sign    Worried About Running Out of Food in the Last Year: Never true    Ran Out of Food in the Last Year: Never true  Transportation Needs: No Transportation Needs (05/09/2019)   PRAPARE - Hydrologist (Medical): No    Lack of  Transportation (Non-Medical): No  Physical Activity: Inactive (05/09/2019)   Exercise Vital Sign    Days of Exercise per Week: 0 days    Minutes of Exercise per Session: 0 min  Stress: No Stress Concern Present (05/09/2019)   [place]    Feeling of Stress : Not at all  Social Connections: Not on file  Intimate Partner Violence: Not At Risk (05/09/2019)   Humiliation, Afraid, Rape, and Kick questionnaire    Fear of Current or Ex-Partner: No    Emotionally Abused: No    Physically Abused: No    Sexually Abused: No    Past Surgical History:  Procedure Laterality Date   APPENDECTOMY     CYSTOSCOPY N/A 04/22/2020   Procedure: CYSTOSCOPY;  Surgeon: [name], MD;  Location: [facility];  Service: Urology;  Laterality: N/A;   finger torn tendon laceration     TONSILLECTOMY     TRANSURETHRAL RESECTION OF PROSTATE N/A 04/22/2020   Procedure: TRANSURETHRAL RESECTION OF THE PROSTATE (TURP);  Surgeon: [name], MD;  Location: [facility];  Service: Urology;  Laterality: N/A;  1 HR    Family History  Problem Relation Age of Onset   Heart attack Father    Kidney disease Mother    Heart disease Mother     Allergies  Allergen Reactions   Codeine Other (See Comments)    unknown    Current Outpatient Medications on File Prior to Visit  Medication Sig Dispense Refill   Cholecalciferol 25 MCG (1000 UT) tablet Take 1,000 Units by mouth daily.      finasteride (PROSCAR) 5 MG tablet Take 5 mg by mouth daily.     Multiple Vitamins-Minerals (MULTIVITAMIN WITH MINERALS) tablet Take 1 tablet by mouth daily.     vitamin C (ASCORBIC ACID) 500 MG tablet Take 1,000 mg by mouth daily.      No current facility-administered medications on file prior to visit.    BP 118/68 (BP Location: Left Arm, Patient Position: Sitting)   Pulse 78   Temp 99 F (37.2 C) (Oral)   Ht '5\' 10"'$  (1.778 m)   Wt 233 lb 6 oz (105.9 kg)   SpO2 95%    BMI 33.49 kg/m  Objective:   Physical Exam Cardiovascular:     Rate and Rhythm: Normal rate and regular rhythm.  Pulmonary:     Effort: Pulmonary effort is normal.     Breath sounds: Normal breath sounds. No wheezing or rales.  Musculoskeletal:     Cervical back: Neck supple.     Right lower leg: Swelling present. No tenderness or bony tenderness.     Left lower leg: Swelling present. No tenderness or bony tenderness.  Legs:     Comments: Normal range of motion to bilateral lower extremities, including calves.  No difficulty getting up and down from exam table.  Ambulates well without assistance.  Skin:    General: Skin is warm and dry.     Comments: 0.5 cm rounded, dark red, flat area to left medial thigh without drainage or surrounding erythema/rash.  0.25 centimeters rounded, dark red, flat area to right anterior shin with superficial open wound.  No drainage, no surrounding erythema/rash.  Neurological:     Mental Status: He is alert and oriented to person, place, and time.           Assessment & Plan:   Problem List Items Addressed This Visit       Musculoskeletal and Integument   Tick bite of left thigh    No obvious signs for Lyme disease at either tick bite site.  Discussed to stop using rubbing alcohol as this is likely prolonging healing.  Lab pending today for Lyme disease.      Relevant Orders   B. burgdorfi antibodies by WB     Other   Right calf pain - Primary    Symptoms appear to be more muscular in etiology, however, given lack of resolve or significant improvement we need to rule out other causes.  Venous ultrasound pending for the right lower extremity.  If negative then consider reevaluation with sports med versus physical therapy.  His exam today is grossly benign, he has no pain today.  Await results.      Relevant Orders   US Venous Img Lower Unilateral Right (DVT)       [name], NP

## 2022-01-13 NOTE — Patient Instructions (Signed)
Stop by the lab prior to leaving today. I will notify you of your results once received.   You will be contacted regarding your ultrasound.  Please let us know if you have not been contacted by Monday next week.  It was a pleasure to see you today!

## 2022-01-13 NOTE — Assessment & Plan Note (Signed)
Symptoms appear to be more muscular in etiology, however, given lack of resolve or significant improvement we need to rule out other causes.  Venous ultrasound pending for the right lower extremity.  If negative then consider reevaluation with sports med versus physical therapy.  His exam today is grossly benign, he has no pain today.  Await results.

## 2022-01-14 ENCOUNTER — Ambulatory Visit
Admission: RE | Admit: 2022-01-14 | Discharge: 2022-01-14 | Disposition: A | Discharge status: Home / Self Care | Payer: Medicare Other | Source: Ambulatory Visit | Attending: Primary Care | Admitting: Primary Care

## 2022-01-14 DIAGNOSIS — M79661 Pain in right lower leg: Secondary | ICD-10-CM

## 2022-01-14 DIAGNOSIS — I8391 Asymptomatic varicose veins of right lower extremity: Secondary | ICD-10-CM | POA: Diagnosis not present

## 2022-01-18 LAB — B. BURGDORFI ANTIBODIES BY WB
B burgdorferi IgG Abs (IB): NEGATIVE
B burgdorferi IgM Abs (IB): NEGATIVE
Lyme Disease 18 kD IgG: NONREACTIVE
Lyme Disease 23 kD IgG: NONREACTIVE
Lyme Disease 23 kD IgM: NONREACTIVE
Lyme Disease 28 kD IgG: NONREACTIVE
Lyme Disease 30 kD IgG: NONREACTIVE
Lyme Disease 39 kD IgG: REACTIVE — AB
Lyme Disease 39 kD IgM: NONREACTIVE
Lyme Disease 41 kD IgG: REACTIVE — AB
Lyme Disease 41 kD IgM: NONREACTIVE
Lyme Disease 45 kD IgG: NONREACTIVE
Lyme Disease 58 kD IgG: REACTIVE — AB
Lyme Disease 66 kD IgG: NONREACTIVE
Lyme Disease 93 kD IgG: REACTIVE — AB

## 2022-01-20 ENCOUNTER — Telehealth: Payer: Self-pay | Admitting: Primary Care

## 2022-01-20 NOTE — Telephone Encounter (Signed)
Please notify patient that I have yet to review his results but we will be in touch as soon as I have a chance to take a look.

## 2022-01-20 NOTE — Telephone Encounter (Signed)
[facility] - Client Nonclinical Telephone Record  [facility] Client [facility] Day - Client Client Site [place] - Day Provider [facility] - NP Contact Type Call Who Is Calling Patient / Member / Family / Caregiver Caller Name [place] Phone Number [phone] Patient Name Cory Parker Patient DOB Jan 17, 1941 Call Type Message Only Information Provided Reason for Call Request to Schedule Office Appointment Initial Comment Caller needs to know about the final results for blood test, please call the patient back, needs to schedule a follow appt. Disp. Time Disposition Final User 01/20/2022 7:57:11 AM General Information Provided Yes Rhea Belton Call Closed By: Rhea Belton Transaction Date/Time: 01/20/2022 7:54:07 AM (ET   Sending note to Gentry Fitz NP and Lavina Hamman CMA.

## 2022-01-20 NOTE — Telephone Encounter (Signed)
Left message to return call to our office.  

## 2022-01-20 NOTE — Telephone Encounter (Signed)
Patient stated he wanted someone to reach out to him regarding his blood test results. Thank you.

## 2022-01-26 NOTE — Telephone Encounter (Signed)
Reviewed chart patient was sent my chart message with results.  No further action needed at this time.

## 2022-01-27 ENCOUNTER — Encounter: Payer: Self-pay | Admitting: Family Medicine

## 2022-01-27 ENCOUNTER — Ambulatory Visit (INDEPENDENT_AMBULATORY_CARE_PROVIDER_SITE_OTHER): Payer: Medicare Other | Admitting: Family Medicine

## 2022-01-27 VITALS — BP 120/60 | HR 59 | Temp 98.4°F | Ht 70.0 in | Wt 234.5 lb

## 2022-01-27 DIAGNOSIS — S86111S Strain of other muscle(s) and tendon(s) of posterior muscle group at lower leg level, right leg, sequela: Secondary | ICD-10-CM

## 2022-01-27 NOTE — Progress Notes (Unsigned)
Sherronda Sweigert T. Pilar Corrales, MD, [place] at [facility] [place], [address]  Phone: [phone]  FAX: [place] - 81 y.o. male  MRN [id number]  Date of Birth: May 12, 1941  Date: 01/27/2022  PCP: [name], NP  Referral: [name], NP  Chief Complaint  Patient presents with   Calf Pain    Right   Subjective:   Cory Parker is a 81 y.o. very pleasant male patient with Body mass index is 33.65 kg/m. who presents with the following:  He presents to follow-up on right-sided calf pain.  I did see him well over 2 months ago with a gastroc strain, and he is still having some persistent pain.  At that point, felt like he had had a lateral gastroc injury.  Felt as if it was a grade 1 injury and I anticipated that he would do well with some basic motion and time.  When gets up in the morning and it will feel tight.   Will hurt when he stands up.  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    Pain localizes to the muscle belly of the lateral gastrocnemius on the right side.  There is no radicular symptoms, numbness, tingling.  He is not having any focal weakness.  11/05/2021 Last OV with Owens Loffler, MD  He saw my partner Dr. Danise Mina on October 26, 2021, at that point he did have some pain in the right calf.   R lateral leg, sore and it hurts.  It will hurt for a couple of hours.  Tylenol will help with sleep.   Lateral calf.  Sitting in a chair will hurt.  Will ache and grab when trying to hit his golf swing.    He never had any bruising.  He recalls an incident where he hurt his back mildly when he was washing his car, though this is not a  Acute injury.  He does not have any gap in the musculature.   Review of Systems is noted in the HPI, as appropriate  Objective:   BP 120/60   Pulse (!) 59   Temp 98.4 F (36.9 C) (Oral)   Ht '5\' 10"'$  (1.778 m)   Wt 234 lb  8 oz (106.4 kg)   SpO2 95%   BMI 33.65 kg/m   GEN: No acute distress; alert,appropriate. PULM: Breathing comfortably in no respiratory distress PSYCH: Normally interactive.  1-2+ lower extremity edema.  Medial gastrocnemius is nontender.  Lateral gastrocnemius is tender to palpation and with manipulation of the muscle belly itself.  Nontender at the foot and ankle, full range of motion at the foot and ankle as well as at the knee.  Achilles tendon is intact, nontender peroneal, posterior tibialis tendons.  He is able to do a toe raise. He does have decreased flexibility in the right calf compared to the left.  Does cause some discomfort when he stretches the calf.  Laboratory and Imaging Data:  Assessment and Plan:     ICD-10-CM   1. Gastrocnemius strain, right, sequela  S86.111S Ambulatory referral to Physical Therapy     No pain with sitting, no pain with rest, but at various points when he is stressing the calf and does seem to cause him some pain still.  Think that he does have some restricted motion there, and I think that working on his strength and flexibility in general  would be helpful.  I am gonna have him go to formal physical therapy.  I think that he can progress his activity and play golf as tolerated.  Orders placed today for conditions managed today: Orders Placed This Encounter  Procedures   Ambulatory referral to Physical Therapy    Follow-up if needed: Return in about 7 weeks (around 03/17/2022).  Dragon [employer] One speech-to-text software was used for transcription in this dictation.  Possible transcriptional errors can occur using Editor, commissioning.   Signed,  Maud Deed. Kervin Bones, MD   Outpatient Encounter Medications as of 01/27/2022  Medication Sig   Cholecalciferol 25 MCG (1000 UT) tablet Take 1,000 Units by mouth daily.    finasteride (PROSCAR) 5 MG tablet Take 5 mg by mouth daily.   Multiple Vitamins-Minerals (MULTIVITAMIN WITH MINERALS) tablet  Take 1 tablet by mouth daily.   vitamin C (ASCORBIC ACID) 500 MG tablet Take 1,000 mg by mouth daily.    No facility-administered encounter medications on file as of 01/27/2022.

## 2022-01-29 NOTE — Therapy (Signed)
OUTPATIENT PHYSICAL THERAPY LOWER EXTREMITY EVALUATION   Patient Name: Cory Parker MRN: [id number] DOB:10-23-1940, 81 y.o., male Today's Date: 02/02/2022   PT End of Session - 02/02/22 1346     Visit Number 1    Number of Visits 20    Date for PT Re-Evaluation 04/13/22    Authorization Type UHC Medicare $20 copay    PT Start Time 1346    PT Stop Time 1419    PT Time Calculation (min) 33 min    Activity Tolerance Patient tolerated treatment well    Behavior During Therapy WFL for tasks assessed/performed             Past Medical History:  Diagnosis Date   History of bronchitis    Varicose veins    Past Surgical History:  Procedure Laterality Date   APPENDECTOMY     CYSTOSCOPY N/A 04/22/2020   Procedure: CYSTOSCOPY;  Surgeon: [name], MD;  Location: [facility];  Service: Urology;  Laterality: N/A;   finger torn tendon laceration     TONSILLECTOMY     TRANSURETHRAL RESECTION OF PROSTATE N/A 04/22/2020   Procedure: TRANSURETHRAL RESECTION OF THE PROSTATE (TURP);  Surgeon: [name], MD;  Location: [facility];  Service: Urology;  Laterality: N/A;  1 HR   Patient Active Problem List   Diagnosis Date Noted   Right calf pain 01/13/2022   Tick bite of left thigh 01/13/2022   Strain of right calf muscle 10/26/2021   Family history of heart disease 08/07/2019   Erythema of lower extremity 07/30/2019   Elevated PSA 05/09/2018   Left leg pain 06/17/2017   BPH (benign prostatic hyperplasia) 04/29/2017   Encounter for annual general medical examination with abnormal findings in adult 04/12/2016   Varicose veins of lower extremities with other complications 16/60/6301   Venous (peripheral) insufficiency 08/30/2008    PCP: [name] NP  REFERRING PROVIDER: Owens Loffler, MD  REFERRING DIAG: S86.111S (ICD-10-CM) - Gastrocnemius strain, right, sequela  THERAPY DIAG:  Pain in right lower leg  Muscle weakness (generalized)  Difficulty in walking,  not elsewhere classified  Rationale for Evaluation and Treatment Rehabilitation  ONSET DATE: Late March 2023  SUBJECTIVE:   SUBJECTIVE STATEMENT: Pt indicated pulling in lower leg, unsure of what happened.  Pt indicated he waited a little bit and then family saw MD about it.  Pt indicated there are days now where it hurts and tight and some days that it isn't as bad.  Pt indicated feeling tightness when getting up after a while and in morning.   Pt indicated doing some of the exercises from MD office but still has noted some trouble.  Reported initial walking can be tight but improves  PERTINENT HISTORY: Unremarkable   PAIN:  NPRS scale: currently at worst 7/10 , in past at worst 10/10 Pain location: Rt lateral calf Pain description: tightness/stiffness Aggravating factors: walking at start, tightness in stairs Relieving factors: stretching  PRECAUTIONS: None  WEIGHT BEARING RESTRICTIONS No  FALLS:  Has patient fallen in last 6 months? No  LIVING ENVIRONMENT: Lives in: House/apartment Stairs: 2 story house c bedroom on 2nd floor  OCCUPATION: Salesman, sitting in office required.  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  PLOF: Independent, golf, walking for exercise  PATIENT GOALS   Improve symptoms (no tightness/pain), swinging golf club   OBJECTIVE:    PATIENT SURVEYS:  02/02/2022 FOTO intake: 66     predicted:  66  COGNITION: 02/02/2022  Overall cognitive status: Within functional limits for tasks assessed     SENSATION: 02/02/2022 [facility]  EDEMA:  02/02/2022 Unremarkable   POSTURE:  02/02/2022 Mild forward trunk posture in standing/walking  PALPATION: 02/02/2022 Tenderness and trigger point in Rt lateral gastroc  LOWER EXTREMITY ROM:  Active ROM Right 02/02/2022 Left 02/02/2022  Hip flexion    Hip extension    Hip abduction    Hip adduction    Hip internal rotation    Hip external rotation    Knee flexion    Knee extension     Ankle dorsiflexion 0 in 90 deg knee flexion -5 in 0 deg knee extension 5 in 90 deg knee flexion 0 in 0 deg knee extension  Ankle plantarflexion [facility]  Ankle inversion [facility]  Ankle eversion WFL WFL   (Blank rows = not tested)  LOWER EXTREMITY MMT:  MMT Right 02/02/2022 Left 02/02/2022  Hip flexion 5/5 5/5  Hip extension    Hip abduction    Hip adduction    Hip internal rotation    Hip external rotation    Knee flexion 5/5 5/5  Knee extension 5/5 5/5  Ankle dorsiflexion 5/5 5/5  Ankle plantarflexion 3/5 3/5  Ankle inversion 5/5 5/5  Ankle eversion 5/5 5/5   (Blank rows = not tested)  FUNCTIONAL TESTS:  02/02/2022 Lt SLS: 4 seconds     Rt SLS:  6 seconds 18 inch chair  : unassisted no UE on 1st try  GAIT: 02/02/2022 Independent ambulation, mild deviation in toe off progression of Rt foot in stance.     TODAY'S TREATMENT: 02/02/2022 Therex:  HEP instruction/performance c cues for techniques, handout provided.  Trial set performed of each for comprehension and symptom assessment.  See below for exercise list.   PATIENT EDUCATION:  02/02/2022 Education details: HEP, POC Person educated: Patient Education method: Explanation, Demonstration, Verbal cues, and Handouts Education comprehension: verbalized understanding, returned demonstration, and verbal cues required   HOME EXERCISE PROGRAM: Access Code: [email or web address] URL: [email or web address] Date: 02/02/2022 Prepared by: Scot Jun  Exercises - Standing Gastroc Stretch on Step  - 2 x daily - 7 x weekly - 1 sets - 5 reps - 30 sec hold - Standing Dorsiflexion Self-Mobilization on Step  - 2 x daily - 7 x weekly - 1-2 sets - 10 reps - 2-3 hold - Single Leg Stance  - 2 x daily - 7 x weekly - 1 sets - 5 reps - 15-20 hold - Soleus Stretch on Wall  - 1 x daily - 7 x weekly - 1 sets - 5 reps - 30 hold  ASSESSMENT:  CLINICAL IMPRESSION: Patient is a 81 y.o. who comes to clinic with complaints of Rt lower leg  pain with mobility, strength and movement coordination deficits that impair their ability to perform usual daily and recreational functional activities without increase difficulty/symptoms at this time.  Patient to benefit from skilled PT services to address impairments and limitations to improve to previous level of function without restriction secondary to condition.    OBJECTIVE IMPAIRMENTS Abnormal gait, decreased activity tolerance, decreased balance, decreased coordination, decreased endurance, decreased mobility, difficulty walking, decreased ROM, decreased strength, increased fascial restrictions, impaired perceived functional ability, impaired flexibility, improper body mechanics, and pain.   ACTIVITY LIMITATIONS bending, standing, stairs, and locomotion level  PARTICIPATION LIMITATIONS: interpersonal relationship, community activity, occupation, and exercise(golf)  PERSONAL FACTORS  No factors  are also affecting patient's functional outcome.   REHAB POTENTIAL: Good  CLINICAL DECISION MAKING: Stable/uncomplicated  EVALUATION COMPLEXITY: Low   GOALS: Goals reviewed with patient? Yes  Short term PT Goals (target date for Short term goals are 3 weeks 02/23/2022) Patient will demonstrate independent use of home exercise program to maintain progress from in clinic treatments. Goal status: New   Long term PT goals (target dates for all long term goals are 10 weeks  04/13/2022 )  1. Patient will demonstrate/report pain at worst less than or equal to 2/10 to facilitate minimal limitation in daily activity secondary to pain symptoms. Goal status: New  2. Patient will demonstrate independent use of home exercise program to facilitate ability to maintain/progress functional gains from skilled physical therapy services. Goal status: New  3. Patient will demonstrate FOTO outcome > or = 76 % to indicate reduced disability due to condition. Goal status: New  4.  Patient will demonstrate  bilateral LE MMT 5/5 throughout to facilitate ability to perform usual standing, walking, stairs at PLOF s limitation due to symptoms. Goal status: New  5.  Patient will demonstrate B SLS > or = 15 seconds to facilitate stability in ambulation on even/uneven surfaces to reduce fall risk.   Goal status: New  6.  Patient will demonstrate bilateral ankle AROM > or = 5 degrees to facilitate normalized heel to toe gait pattern.   Goal status: New    PLAN: PT FREQUENCY: 1-2x/week  PT DURATION: 10 weeks  PLANNED INTERVENTIONS: Therapeutic exercises, Therapeutic activity, Neuro Muscular re-education, Balance training, Gait training, Patient/Family education, Joint mobilization, Stair training, DME instructions, Dry Needling, Electrical stimulation, Cryotherapy, Moist heat, Taping, Ultrasound, Ionotophoresis '4mg'$ /ml Dexamethasone, and Manual therapy.  All included unless contraindicated  PLAN FOR NEXT SESSION: Review HEP use and results.  Possible DN.    Scot Jun, PT, DPT, OCS, ATC 02/02/22  2:24 PM

## 2022-02-02 ENCOUNTER — Other Ambulatory Visit: Payer: Self-pay

## 2022-02-02 ENCOUNTER — Ambulatory Visit: Payer: Medicare Other | Admitting: Rehabilitative and Restorative Service Providers"

## 2022-02-02 ENCOUNTER — Encounter: Payer: Self-pay | Admitting: Rehabilitative and Restorative Service Providers"

## 2022-02-02 DIAGNOSIS — H2513 Age-related nuclear cataract, bilateral: Secondary | ICD-10-CM | POA: Diagnosis not present

## 2022-02-02 DIAGNOSIS — R262 Difficulty in walking, not elsewhere classified: Secondary | ICD-10-CM

## 2022-02-02 DIAGNOSIS — M79661 Pain in right lower leg: Secondary | ICD-10-CM

## 2022-02-02 DIAGNOSIS — M6281 Muscle weakness (generalized): Secondary | ICD-10-CM

## 2022-02-11 ENCOUNTER — Ambulatory Visit: Payer: Medicare Other | Admitting: Rehabilitative and Restorative Service Providers"

## 2022-02-11 ENCOUNTER — Encounter: Payer: Self-pay | Admitting: Rehabilitative and Restorative Service Providers"

## 2022-02-11 DIAGNOSIS — M6281 Muscle weakness (generalized): Secondary | ICD-10-CM | POA: Diagnosis not present

## 2022-02-11 DIAGNOSIS — R262 Difficulty in walking, not elsewhere classified: Secondary | ICD-10-CM | POA: Diagnosis not present

## 2022-02-11 DIAGNOSIS — M79661 Pain in right lower leg: Secondary | ICD-10-CM

## 2022-02-11 NOTE — Therapy (Addendum)
OUTPATIENT PHYSICAL THERAPY TREATMENT NOTE /DISCHARGE   Patient Name: Cory Parker MRN: [id number] DOB:Jan 31, 1941, 81 y.o., male Today's Date: 02/11/2022  PCP: [name], NP REFERRING PROVIDER: Owens Loffler, MD  END OF SESSION:   PT End of Session - 02/11/22 1345     Visit Number 2    Number of Visits 20    Date for PT Re-Evaluation 04/13/22    Authorization Type UHC Medicare $20 copay    PT Start Time 1301    PT Stop Time 1339    PT Time Calculation (min) 38 min    Activity Tolerance Patient tolerated treatment well;No increased pain    Behavior During Therapy WFL for tasks assessed/performed             Past Medical History:  Diagnosis Date   History of bronchitis    Varicose veins    Past Surgical History:  Procedure Laterality Date   APPENDECTOMY     CYSTOSCOPY N/A 04/22/2020   Procedure: CYSTOSCOPY;  Surgeon: [name], MD;  Location: [facility];  Service: Urology;  Laterality: N/A;   finger torn tendon laceration     TONSILLECTOMY     TRANSURETHRAL RESECTION OF PROSTATE N/A 04/22/2020   Procedure: TRANSURETHRAL RESECTION OF THE PROSTATE (TURP);  Surgeon: [name], MD;  Location: [facility];  Service: Urology;  Laterality: N/A;  1 HR   Patient Active Problem List   Diagnosis Date Noted   Right calf pain 01/13/2022   Tick bite of left thigh 01/13/2022   Strain of right calf muscle 10/26/2021   Family history of heart disease 08/07/2019   Erythema of lower extremity 07/30/2019   Elevated PSA 05/09/2018   Left leg pain 06/17/2017   BPH (benign prostatic hyperplasia) 04/29/2017   Encounter for annual general medical examination with abnormal findings in adult 04/12/2016   Varicose veins of lower extremities with other complications 63/87/5643   Venous (peripheral) insufficiency 08/30/2008    REFERRING DIAG: P29.518A (ICD-10-CM) - Gastrocnemius strain, right, sequela  THERAPY DIAG:  Pain in right lower leg  Muscle weakness  (generalized)  Difficulty in walking, not elsewhere classified  Rationale for Evaluation and Treatment Rehabilitation  PERTINENT HISTORY: Venous insufficiency  PRECAUTIONS: None  SUBJECTIVE: Cory Parker reports good early HEP after visit 1.  His compliance has slipped as he has been dealing with a family medical emergency.  PAIN:  Are you having pain? Yes: NPRS scale: 3-7/10 Pain location: R lateral calf Pain description: Tight, achy, can be sharp Aggravating factors: Movement after prolonged rest (standing, sitting, resting) Relieving factors: HEP   OBJECTIVE: (objective measures completed at initial evaluation unless otherwise dated)  OBJECTIVE:      PATIENT SURVEYS:  02/02/2022 FOTO intake: 66     predicted:   76   COGNITION: 02/02/2022            Overall cognitive status: Within functional limits for tasks assessed                          [facility]/2022 [facility]   EDEMA:  02/02/2022 Unremarkable    POSTURE:  02/02/2022 Mild forward trunk posture in standing/walking   PALPATION: 02/02/2022 Tenderness and trigger point in Rt lateral gastroc   LOWER EXTREMITY ROM:   Active ROM Right 02/02/2022 Left 02/02/2022  Hip flexion      Hip extension      Hip abduction      Hip adduction  Hip internal rotation      Hip external rotation      Knee flexion      Knee extension      Ankle dorsiflexion 0 in 90 deg knee flexion -5 in 0 deg knee extension 5 in 90 deg knee flexion 0 in 0 deg knee extension  Ankle plantarflexion [facility]  Ankle inversion [facility]  Ankle eversion WFL WFL   (Blank rows = not tested)   LOWER EXTREMITY MMT:   MMT Right 02/02/2022 Left 02/02/2022  Hip flexion 5/5 5/5  Hip extension      Hip abduction      Hip adduction      Hip internal rotation      Hip external rotation      Knee flexion 5/5 5/5  Knee extension 5/5 5/5  Ankle dorsiflexion 5/5 5/5  Ankle plantarflexion 3/5 3/5  Ankle inversion 5/5 5/5  Ankle eversion 5/5 5/5   (Blank rows =  not tested)   FUNCTIONAL TESTS:  02/02/2022 Lt SLS: 4 seconds     Rt SLS:  6 seconds 18 inch chair  : unassisted no UE on 1st try   GAIT: 02/02/2022 Independent ambulation, mild deviation in toe off progression of Rt foot in stance.        TODAY'S TREATMENT: 02/11/2022 Therapeutic Exercises: -Upper heel cords stretch with front foot on a step, back foot slightly toed in 5X 30 seconds -Lower heel cords stretch with front foot on a step, back foot slightly toed in 5X 30 seconds -Calf stretch with foot on step (slight toe in and avoid knee hyperextension) 5X 30 seconds -Heel Raises 10X 3 seconds  Neuromuscular re-education: Tandem balance 5X 20 seconds Single leg stance 5X 15 seconds   02/02/2022 Therex:            HEP instruction/performance c cues for techniques, handout provided.  Trial set performed of each for comprehension and symptom assessment.  See below for exercise list.     PATIENT EDUCATION:  02/02/2022 Education details: HEP, POC Person educated: Patient Education method: Explanation, Demonstration, Verbal cues, and Handouts Education comprehension: verbalized understanding, returned demonstration, and verbal cues required     HOME EXERCISE PROGRAM: Access Code: [email or web address] URL: [email or web address] Date: 02/02/2022 Prepared by: Scot Jun   Exercises - Standing Gastroc Stretch on Step  - 2 x daily - 7 x weekly - 1 sets - 5 reps - 30 sec hold - Standing Dorsiflexion Self-Mobilization on Step  - 2 x daily - 7 x weekly - 1-2 sets - 10 reps - 2-3 hold - Single Leg Stance  - 2 x daily - 7 x weekly - 1 sets - 5 reps - 15-20 hold - Soleus Stretch on Wall  - 1 x daily - 7 x weekly - 1 sets - 5 reps - 30 hold   ASSESSMENT:   CLINICAL IMPRESSION: Cory Parker notes early progress with his L calf pain and stiffness.  He did require some correction to avoid knee hyperextension and correct posture with his exercises.  We added a gastroc/soleus strengthening activity  to complement his flexibility and balance program from evaluation.  His prognosis remains good to meet his long-term goals with continued work.   OBJECTIVE IMPAIRMENTS Abnormal gait, decreased activity tolerance, decreased balance, decreased coordination, decreased endurance, decreased mobility, difficulty walking, decreased ROM, decreased strength, increased fascial restrictions, impaired perceived functional ability, impaired flexibility, improper body mechanics, and pain.    ACTIVITY LIMITATIONS bending, standing, stairs, and locomotion level  PARTICIPATION LIMITATIONS: interpersonal relationship, community activity, occupation, and exercise(golf)   PERSONAL FACTORS  No factors  are also affecting patient's functional outcome.    REHAB POTENTIAL: Good   CLINICAL DECISION MAKING: Stable/uncomplicated   EVALUATION COMPLEXITY: Low     GOALS: Goals reviewed with patient? Yes   Short term PT Goals (target date for Short term goals are 3 weeks 02/23/2022) Patient will demonstrate independent use of home exercise program to maintain progress from in clinic treatments. Goal status: Met 02/11/2022   Long term PT goals (target dates for all long term goals are 10 weeks  04/13/2022 )   1. Patient will demonstrate/report pain at worst less than or equal to 2/10 to facilitate minimal limitation in daily activity secondary to pain symptoms. Goal status: On Going 02/11/2022   2. Patient will demonstrate independent use of home exercise program to facilitate ability to maintain/progress functional gains from skilled physical therapy services. Goal status: New   3. Patient will demonstrate FOTO outcome > or = 76 % to indicate reduced disability due to condition. Goal status: New   4.  Patient will demonstrate bilateral LE MMT 5/5 throughout to facilitate ability to perform usual standing, walking, stairs at PLOF s limitation due to symptoms. Goal status: New   5.  Patient will demonstrate B SLS  > or = 15 seconds to facilitate stability in ambulation on even/uneven surfaces to reduce fall risk.   Goal status: Met 02/11/2022   6.  Patient will demonstrate bilateral ankle AROM > or = 5 degrees to facilitate normalized heel to toe gait pattern.   Goal status: New       PLAN: PT FREQUENCY: 1-2x/week   PT DURATION: 10 weeks   PLANNED INTERVENTIONS: Therapeutic exercises, Therapeutic activity, Neuro Muscular re-education, Balance training, Gait training, Patient/Family education, Joint mobilization, Stair training, DME instructions, Dry Needling, Electrical stimulation, Cryotherapy, Moist heat, Taping, Ultrasound, Ionotophoresis 59m/ml Dexamethasone, and Manual therapy.  All included unless contraindicated   PLAN FOR NEXT SESSION: Review HEP use and results.  Possible DN.  [place]       RFarley Ly PT, MPT 02/11/2022, 1:56 PM   PHYSICAL THERAPY DISCHARGE SUMMARY  Visits from Start of Care: 2  Current functional level related to goals / functional outcomes: See note   Remaining deficits: See note   Education / Equipment: HEP  Patient goals were partially met. Patient is being discharged due to not returning since the last visit.  MScot Jun PT, DPT, OCS, ATC 04/13/22  1:35 PM

## 2022-02-18 ENCOUNTER — Encounter: Payer: Medicare Other | Admitting: Rehabilitative and Restorative Service Providers"

## 2022-02-22 ENCOUNTER — Encounter: Payer: Medicare Other | Admitting: Rehabilitative and Restorative Service Providers"

## 2022-03-01 ENCOUNTER — Encounter: Payer: Medicare Other | Admitting: Rehabilitative and Restorative Service Providers"

## 2022-03-15 DIAGNOSIS — R3914 Feeling of incomplete bladder emptying: Secondary | ICD-10-CM | POA: Diagnosis not present

## 2022-04-09 ENCOUNTER — Telehealth: Payer: Self-pay | Admitting: Primary Care

## 2022-04-09 DIAGNOSIS — S70362S Insect bite (nonvenomous), left thigh, sequela: Secondary | ICD-10-CM

## 2022-04-09 DIAGNOSIS — S80861S Insect bite (nonvenomous), right lower leg, sequela: Secondary | ICD-10-CM

## 2022-04-09 NOTE — Telephone Encounter (Signed)
Patient scheduled for 10/12 to repeat testing for lymes disease

## 2022-04-09 NOTE — Telephone Encounter (Signed)
Noted. Order placed.

## 2022-04-09 NOTE — Telephone Encounter (Signed)
Pt called stating when he last seen Clark on 01/13/22, she stated the pt should return in  3 months to have lab for a lymes disease blood test. Can the orders be put in?

## 2022-04-15 ENCOUNTER — Other Ambulatory Visit (INDEPENDENT_AMBULATORY_CARE_PROVIDER_SITE_OTHER): Payer: Medicare Other

## 2022-04-15 DIAGNOSIS — S70362S Insect bite (nonvenomous), left thigh, sequela: Secondary | ICD-10-CM | POA: Diagnosis not present

## 2022-04-15 DIAGNOSIS — S80861S Insect bite (nonvenomous), right lower leg, sequela: Secondary | ICD-10-CM

## 2022-04-15 DIAGNOSIS — W57XXXS Bitten or stung by nonvenomous insect and other nonvenomous arthropods, sequela: Secondary | ICD-10-CM

## 2022-04-19 LAB — B. BURGDORFI ANTIBODIES BY WB
B burgdorferi IgG Abs (IB): NEGATIVE
B burgdorferi IgM Abs (IB): NEGATIVE
Lyme Disease 18 kD IgG: NONREACTIVE
Lyme Disease 23 kD IgG: NONREACTIVE
Lyme Disease 23 kD IgM: NONREACTIVE
Lyme Disease 28 kD IgG: NONREACTIVE
Lyme Disease 30 kD IgG: NONREACTIVE
Lyme Disease 39 kD IgG: REACTIVE — AB
Lyme Disease 39 kD IgM: NONREACTIVE
Lyme Disease 41 kD IgG: REACTIVE — AB
Lyme Disease 41 kD IgM: NONREACTIVE
Lyme Disease 45 kD IgG: NONREACTIVE
Lyme Disease 58 kD IgG: REACTIVE — AB
Lyme Disease 66 kD IgG: NONREACTIVE
Lyme Disease 93 kD IgG: REACTIVE — AB

## 2022-04-21 NOTE — Telephone Encounter (Signed)
Scheduled patient for 10/26

## 2022-04-21 NOTE — Telephone Encounter (Signed)
LVM for patient to schedule appointment.

## 2022-04-29 ENCOUNTER — Ambulatory Visit (INDEPENDENT_AMBULATORY_CARE_PROVIDER_SITE_OTHER): Payer: Medicare Other | Admitting: *Deleted

## 2022-04-29 ENCOUNTER — Encounter: Payer: Self-pay | Admitting: Primary Care

## 2022-04-29 ENCOUNTER — Ambulatory Visit (INDEPENDENT_AMBULATORY_CARE_PROVIDER_SITE_OTHER): Payer: Medicare Other | Admitting: Primary Care

## 2022-04-29 VITALS — BP 138/72 | HR 65 | Temp 97.7°F | Ht 70.0 in | Wt 234.0 lb

## 2022-04-29 DIAGNOSIS — W57XXXS Bitten or stung by nonvenomous insect and other nonvenomous arthropods, sequela: Secondary | ICD-10-CM

## 2022-04-29 DIAGNOSIS — Z0000 Encounter for general adult medical examination without abnormal findings: Secondary | ICD-10-CM | POA: Diagnosis not present

## 2022-04-29 DIAGNOSIS — N400 Enlarged prostate without lower urinary tract symptoms: Secondary | ICD-10-CM | POA: Diagnosis not present

## 2022-04-29 DIAGNOSIS — M79661 Pain in right lower leg: Secondary | ICD-10-CM

## 2022-04-29 DIAGNOSIS — Z87828 Personal history of other (healed) physical injury and trauma: Secondary | ICD-10-CM | POA: Diagnosis not present

## 2022-04-29 NOTE — Progress Notes (Signed)
Subjective:   Cory Parker is a 81 y.o. male who presents for Medicare Annual/Subsequent preventive examination.  I connected with  Cory Parker on 04/29/22 by a telephone enabled telemedicine application and verified that I am speaking with the correct person using two identifiers.   I discussed the limitations of evaluation and management by telemedicine. The patient expressed understanding and agreed to proceed.  Patient location: home  Provider location: Tele-health-home    Review of Systems     Cardiac Risk Factors include: advanced age (>43mn, >>62women);family history of premature cardiovascular disease;male gender;obesity (BMI >30kg/m2)     Objective:    Today's Vitals   There is no height or weight on file to calculate BMI.     02/02/2022    1:58 PM 04/22/2020    9:51 AM 04/16/2020    8:13 AM 02/16/2020    1:15 PM 02/09/2020    2:58 PM 05/09/2019    9:12 AM 01/03/2019    2:48 PM  Advanced Directives  Does Patient Have a Medical Advance Directive? Yes Yes Yes Yes Yes Yes Yes  Type of [place]of [place] will [place] will Healthcare Power of [place] will [place] will  Does patient want to make changes to medical advance directive?  No - Patient declined No - Patient declined No - Patient declined   No - Patient declined  Copy of [place] Chart? Yes - validated most recent copy scanned in chart (See row information)  No - copy requested   No - copy requested     Current Medications (verified) Outpatient Encounter Medications as of 04/29/2022  Medication Sig   Cholecalciferol 25 MCG (1000 UT) tablet Take 1,000 Units by mouth daily.    Multiple Vitamins-Minerals (MULTIVITAMIN WITH MINERALS) tablet Take 1 tablet by mouth daily.   vitamin C (ASCORBIC ACID) 500  MG tablet Take 1,000 mg by mouth daily.    No facility-administered encounter medications on file as of 04/29/2022.    Allergies (verified) Codeine   History: Past Medical History:  Diagnosis Date   History of bronchitis    Varicose veins    Past Surgical History:  Procedure Laterality Date   APPENDECTOMY     CYSTOSCOPY N/A 04/22/2020   Procedure: CYSTOSCOPY;  Surgeon: NRemi Haggard MD;  Location: [facility];  Service: Urology;  Laterality: N/A;   finger torn tendon laceration     TONSILLECTOMY     TRANSURETHRAL RESECTION OF PROSTATE N/A 04/22/2020   Procedure: TRANSURETHRAL RESECTION OF THE PROSTATE (TURP);  Surgeon: NRemi Haggard MD;  Location: [facility];  Service: Urology;  Laterality: N/A;  1 HR   Family History  Problem Relation Age of Onset   Heart attack Father    Kidney disease Mother    Heart disease Mother    Social History   Socioeconomic History   Marital status: Married    Spouse name: Not on file   Number of children: Not on file   Years of education: Not on file   Highest education level: Not on file  Occupational History   Not on file  Tobacco Use   Smoking status: Never   Smokeless tobacco: Never  Vaping Use   Vaping Use: Never used  Substance and Sexual Activity   Alcohol use: Yes    Alcohol/week: 1.0 - 2.0 standard drink of alcohol  Types: 1 - 2 Cans of beer per week   Drug use: No   Sexual activity: Not on file  Other Topics Concern   Not on file  Social History Narrative   Not on file   Social Determinants of Health   Financial Resource Strain: Low Risk  (04/29/2022)   Overall Financial Resource Strain (CARDIA)    Difficulty of Paying Living Expenses: Not hard at all  Food Insecurity: No Food Insecurity (04/29/2022)   Hunger Vital Sign    Worried About Running Out of Food in the Last Year: Never true    Ran Out of Food in the Last Year: Never true  Transportation Needs: No Transportation Needs (04/29/2022)   PRAPARE -  Hydrologist (Medical): No    Lack of Transportation (Non-Medical): No  Physical Activity: Insufficiently Active (04/29/2022)   Exercise Vital Sign    Days of Exercise per Week: 3 days    Minutes of Exercise per Session: 30 min  Stress: No Stress Concern Present (04/29/2022)   [place]    Feeling of Stress : Not at all  Social Connections: Moderately Integrated (04/29/2022)   Social Connection and Isolation Panel [NHANES]    Frequency of Communication with Friends and Family: Three times a week    Frequency of Social Gatherings with Friends and Family: Three times a week    Attends Religious Services: Never    Active Member of Clubs or Organizations: Yes    Attends Music therapist: Not on file    Marital Status: Married    Tobacco Counseling Counseling given: Not Answered   Clinical Intake:  Pre-visit preparation completed: Yes  Pain : No/denies pain     Diabetes: No  How often do you need to have someone help you when you read instructions, pamphlets, or other written materials from your doctor or pharmacy?: 1 - Never  Diabetic?  no  Interpreter Needed?: No  Information entered by :: Leroy Kennedy LPN   Activities of Daily Living    04/29/2022    1:15 PM 04/28/2022    4:41 PM  In your present state of health, do you have any difficulty performing the following activities:  Hearing? 0 0  Vision? 0 0  Difficulty concentrating or making decisions? 0 0  Walking or climbing stairs? 0 0  Dressing or bathing? 0 0  Doing errands, shopping? 0 0  Preparing Food and eating ? N N  Using the Toilet? N N  In the past six months, have you accidently leaked urine? N N  Do you have problems with loss of bowel control? N N  Managing your Medications? N N  Managing your Finances? N N  Housekeeping or managing your Housekeeping? N N    Patient Care Team: [name], NP as PCP - General (Internal Medicine)  Indicate any recent Medical Services you may have received from other than [facility] providers in the past year (date may be approximate).     Assessment:   This is a routine wellness examination for Cory Parker.  Hearing/Vision screen Hearing Screening - Comments:: Bilateral hearing aids Vision Screening - Comments:: Up to date [place]  Dietary issues and exercise activities discussed: Current Exercise Habits: Home exercise routine, Type of exercise: walking, Time (Minutes): 30, Frequency (Times/Week): 4, Weekly Exercise (Minutes/Week): 120, Intensity: Mild   Goals Addressed  This Visit's Progress    Increase physical activity       Continue lifestyle      Depression Screen    04/29/2022    1:08 PM 05/09/2019    9:12 AM 05/03/2018    1:21 PM 03/28/2014    2:50 PM  PHQ 2/9 Scores  PHQ - 2 Score 0 0 0 0  PHQ- 9 Score 1 0 0     Fall Risk    04/28/2022    4:41 PM 04/16/2021   10:50 AM 05/09/2019    9:12 AM 05/03/2018    1:21 PM 03/28/2014    2:50 PM  [place] in the past year? 0 0 0 No No  Number falls in past yr: 0 0 0    Injury with Fall? 0 0 0    Risk for fall due to :   Medication side effect    Follow up   Falls evaluation completed;Falls prevention discussed      FALL RISK PREVENTION PERTAINING TO THE HOME:  Any stairs in or around the home? Yes  If so, are there any without handrails? No  Home free of loose throw rugs in walkways, pet beds, electrical cords, etc? Yes  Adequate lighting in your home to reduce risk of falls? Yes   ASSISTIVE DEVICES UTILIZED TO PREVENT FALLS:  Life alert? No  Use of a cane, walker or w/c? No  Grab bars in the bathroom? Yes  Shower chair or bench in shower? Yes  Elevated toilet seat or a handicapped toilet? No   TIMED UP AND GO:  Was the test performed? No .    Cognitive Function:    05/09/2019    9:14 AM 05/03/2018   12:36 PM  MMSE  - Mini Mental State Exam  Orientation to time 5 5  Orientation to Place 5 5  Registration 3 3  Attention/ Calculation 5 0  Recall 3 3  Language- name 2 objects  0  Language- repeat 1 1  Language- follow 3 step command  3  Language- read & follow direction  0  Write a sentence  0  Copy design  0  Total score  20        04/29/2022    1:06 PM  6CIT Screen  What Year? 0 points  What month? 0 points  What time? 0 points  Count back from 20 0 points  Months in reverse 0 points  Repeat phrase 0 points  Total Score 0 points    Immunizations Immunization History  Administered Date(s) Administered   Fluad Quad(high Dose 65+) 04/15/2021, 03/09/2022   Influenza Split 04/15/2011   Influenza, High Dose Seasonal PF 05/09/2015   Influenza,inj,Quad PF,6+ Mos 03/30/2013, 03/28/2014, 04/12/2016, 04/29/2017, 05/09/2018, 05/15/2020   Influenza-Unspecified 03/06/2019   PFIZER(Purple Top)SARS-COV-2 Vaccination 09/01/2019, 09/23/2019   Pfizer Covid-19 Vaccine Bivalent Booster 60yr & up 07/10/2021   Pneumococcal Conjugate-13 03/28/2014   Pneumococcal Polysaccharide-23 02/18/2010, 03/30/2013   Td 07/05/2001   Tetanus 03/28/2014   Zoster, Live 04/01/2012    TDAP status: Up to date  Flu Vaccine status: Up to date  Pneumococcal vaccine status: Up to date  Covid-19 vaccine status: Information provided on how to obtain vaccines.   Qualifies for Shingles Vaccine? Yes   Zostavax completed Yes   Shingrix Completed?: No.    Education has been provided regarding the importance of this vaccine. Patient has been advised to call insurance company to determine out of pocket expense if they  have not yet received this vaccine. Advised may also receive vaccine at local pharmacy or Health Dept. Verbalized acceptance and understanding.  Screening Tests Health Maintenance  Topic Date Due   COVID-19 Vaccine (4 - Pfizer series) 05/15/2022 (Originally 11/07/2021)   Zoster Vaccines- Shingrix (1 of 2)  07/30/2022 (Originally 12/14/1992)   Hepatitis C Screening  04/30/2023 (Originally 12/14/1958)   Medicare Annual Wellness (AWV)  05/30/2023   TETANUS/TDAP  03/28/2024   Pneumonia Vaccine 34+ Years old  Completed   INFLUENZA VACCINE  Completed   HPV VACCINES  Aged Out   COLONOSCOPY (Pts 45-64yr Insurance coverage will need to be confirmed)  Discontinued    Health Maintenance  There are no preventive care reminders to display for this patient.   Colorectal cancer screening: No longer required.   Lung Cancer Screening: (Low Dose CT Chest recommended if Age 79-80years, 30 pack-year currently smoking OR have quit w/in 15years.)  qualify.   Lung Cancer Screening Referral:   Additional Screening:  Hepatitis C Screening:does not qualify;   Vision Screening: Recommended annual ophthalmology exams for early detection of glaucoma and other disorders of the eye. Is the patient up to date with their annual eye exam?  Yes  Who is the provider or what is the name of the office in which the patient attends annual eye exams? Dr. SArva ChafeIf pt is not established with a provider, would they like to be referred to a provider to establish care? No .   Dental Screening: Recommended annual dental exams for proper oral hygiene  Community Resource Referral / Chronic Care Management: CRR required this visit?  No   CCM required this visit?  No      Plan:     I have personally reviewed and noted the following in the patient's chart:   Medical and social history Use of alcohol, tobacco or illicit drugs  Current medications and supplements including opioid prescriptions. Patient is not currently taking opioid prescriptions. Functional ability and status Nutritional status Physical activity Advanced directives List of other physicians Hospitalizations, surgeries, and ER visits in previous 12 months Vitals Screenings to include cognitive, depression, and falls Referrals and appointments  In  addition, I have reviewed and discussed with patient certain preventive protocols, quality metrics, and best practice recommendations. A written personalized care plan for preventive services as well as general preventive health recommendations were provided to patient.     JLeroy Kennedy LPN   [id number]  Nurse Notes:

## 2022-04-29 NOTE — Assessment & Plan Note (Signed)
S/P TURP since 2021.  Controlled.  Remain off finasteride 5 mg.

## 2022-04-29 NOTE — Assessment & Plan Note (Signed)
Reviewed Lyme Disease panels from July 2023 and October 2023 reviewed independently and also with patient. We discussed that he does not have active Lyme Disease.   Fortunately, he's asymptomatic.   He had no further questions.

## 2022-04-29 NOTE — Patient Instructions (Signed)
Mr. Cory Parker , Thank you for taking time to come for your Medicare Wellness Visit. I appreciate your ongoing commitment to your health goals. Please review the following plan we discussed and let me know if I can assist you in the future.   These are the goals we discussed:  Goals      Increase physical activity     Starting 05/03/2018, I will continue to walk 60 minutes 5 days per week.      Increase physical activity     Continue lifestyle     Patient Stated     05/09/2019, I will try to increase my exercise level as soon as the pandemic gets under control.         This is a list of the screening recommended for you and due dates:  Health Maintenance  Topic Date Due   COVID-19 Vaccine (4 - Pfizer series) 05/15/2022*   Zoster (Shingles) Vaccine (1 of 2) 07/30/2022*   Hepatitis C Screening: USPSTF Recommendation to screen - Ages 18-79 yo.  04/30/2023*   Medicare Annual Wellness Visit  05/30/2023   Tetanus Vaccine  03/28/2024   Pneumonia Vaccine  Completed   Flu Shot  Completed   HPV Vaccine  Aged Out   Colon Cancer Screening  Discontinued  *Topic was postponed. The date shown is not the original due date.    Advanced directives: Education provided  Conditions/risks identified:     Preventive Care 65 Years and Older, Male  Preventive care refers to lifestyle choices and visits with your health care provider that can promote health and wellness. What does preventive care include? A yearly physical exam. This is also called an annual well check. Dental exams once or twice a year. Routine eye exams. Ask your health care provider how often you should have your eyes checked. Personal lifestyle choices, including: Daily care of your teeth and gums. Regular physical activity. Eating a healthy diet. Avoiding tobacco and drug use. Limiting alcohol use. Practicing safe sex. Taking low doses of aspirin every day. Taking vitamin and mineral supplements as recommended by your  health care provider. What happens during an annual well check? The services and screenings done by your health care provider during your annual well check will depend on your age, overall health, lifestyle risk factors, and family history of disease. Counseling  Your health care provider may ask you questions about your: Alcohol use. Tobacco use. Drug use. Emotional well-being. Home and relationship well-being. Sexual activity. Eating habits. History of falls. Memory and ability to understand (cognition). Work and work Statistician. Screening  You may have the following tests or measurements: Height, weight, and BMI. Blood pressure. Lipid and cholesterol levels. These may be checked every 5 years, or more frequently if you are over 65 years old. Skin check. Lung cancer screening. You may have this screening every year starting at age 19 if you have a 30-pack-year history of smoking and currently smoke or have quit within the past 15 years. Fecal occult blood test (FOBT) of the stool. You may have this test every year starting at age 25. Flexible sigmoidoscopy or colonoscopy. You may have a sigmoidoscopy every 5 years or a colonoscopy every 10 years starting at age 6. Prostate cancer screening. Recommendations will vary depending on your family history and other risks. Hepatitis C blood test. Hepatitis B blood test. Sexually transmitted disease (STD) testing. Diabetes screening. This is done by checking your blood sugar (glucose) after you have not eaten for a while (  fasting). You may have this done every 1-3 years. Abdominal aortic aneurysm (AAA) screening. You may need this if you are a current or former smoker. Osteoporosis. You may be screened starting at age 64 if you are at high risk. Talk with your health care provider about your test results, treatment options, and if necessary, the need for more tests. Vaccines  Your health care provider may recommend certain vaccines, such  as: Influenza vaccine. This is recommended every year. Tetanus, diphtheria, and acellular pertussis (Tdap, Td) vaccine. You may need a Td booster every 10 years. Zoster vaccine. You may need this after age 73. Pneumococcal 13-valent conjugate (PCV13) vaccine. One dose is recommended after age 13. Pneumococcal polysaccharide (PPSV23) vaccine. One dose is recommended after age 56. Talk to your health care provider about which screenings and vaccines you need and how often you need them. This information is not intended to replace advice given to you by your health care provider. Make sure you discuss any questions you have with your health care provider. Document Released: 07/18/2015 Document Revised: 03/10/2016 Document Reviewed: 04/22/2015 Elsevier Interactive Patient Education  2017 [place] Prevention in the Home Falls can cause injuries. They can happen to people of all ages. There are many things you can do to make your home safe and to help prevent falls. What can I do on the outside of my home? Regularly fix the edges of walkways and driveways and fix any cracks. Remove anything that might make you trip as you walk through a door, such as a raised step or threshold. Trim any bushes or trees on the path to your home. Use bright outdoor lighting. Clear any walking paths of anything that might make someone trip, such as rocks or tools. Regularly check to see if handrails are loose or broken. Make sure that both sides of any steps have handrails. Any raised decks and porches should have guardrails on the edges. Have any leaves, snow, or ice cleared regularly. Use sand or salt on walking paths during winter. Clean up any spills in your garage right away. This includes oil or grease spills. What can I do in the bathroom? Use night lights. Install grab bars by the toilet and in the tub and shower. Do not use towel bars as grab bars. Use non-skid mats or decals in the tub or  shower. If you need to sit down in the shower, use a plastic, non-slip stool. Keep the floor dry. Clean up any water that spills on the floor as soon as it happens. Remove soap buildup in the tub or shower regularly. Attach bath mats securely with double-sided non-slip rug tape. Do not have throw rugs and other things on the floor that can make you trip. What can I do in the bedroom? Use night lights. Make sure that you have a light by your bed that is easy to reach. Do not use any sheets or blankets that are too big for your bed. They should not hang down onto the floor. Have a firm chair that has side arms. You can use this for support while you get dressed. Do not have throw rugs and other things on the floor that can make you trip. What can I do in the kitchen? Clean up any spills right away. Avoid walking on wet floors. Keep items that you use a lot in easy-to-reach places. If you need to reach something above you, use a strong step stool that has a grab bar.  Keep electrical cords out of the way. Do not use floor polish or wax that makes floors slippery. If you must use wax, use non-skid floor wax. Do not have throw rugs and other things on the floor that can make you trip. What can I do with my stairs? Do not leave any items on the stairs. Make sure that there are handrails on both sides of the stairs and use them. Fix handrails that are broken or loose. Make sure that handrails are as long as the stairways. Check any carpeting to make sure that it is firmly attached to the stairs. Fix any carpet that is loose or worn. Avoid having throw rugs at the top or bottom of the stairs. If you do have throw rugs, attach them to the floor with carpet tape. Make sure that you have a light switch at the top of the stairs and the bottom of the stairs. If you do not have them, ask someone to add them for you. What else can I do to help prevent falls? Wear shoes that: Do not have high heels. Have  rubber bottoms. Are comfortable and fit you well. Are closed at the toe. Do not wear sandals. If you use a stepladder: Make sure that it is fully opened. Do not climb a closed stepladder. Make sure that both sides of the stepladder are locked into place. Ask someone to hold it for you, if possible. Clearly mark and make sure that you can see: Any grab bars or handrails. First and last steps. Where the edge of each step is. Use tools that help you move around (mobility aids) if they are needed. These include: Canes. Walkers. Scooters. Crutches. Turn on the lights when you go into a dark area. Replace any light bulbs as soon as they burn out. Set up your furniture so you have a clear path. Avoid moving your furniture around. If any of your floors are uneven, fix them. If there are any pets around you, be aware of where they are. Review your medicines with your doctor. Some medicines can make you feel dizzy. This can increase your chance of falling. Ask your doctor what other things that you can do to help prevent falls. This information is not intended to replace advice given to you by your health care provider. Make sure you discuss any questions you have with your health care provider. Document Released: 04/17/2009 Document Revised: 11/27/2015 Document Reviewed: 07/26/2014 Elsevier Interactive Patient Education  2017 [employer].

## 2022-04-29 NOTE — Progress Notes (Signed)
Subjective:    Patient ID: Cory Parker, male    DOB: 1941-04-15, 81 y.o.   MRN: [id number]  HPI  Cory Parker is a very pleasant 81 y.o. male with a history of lower extremity pain, tick bite, venous insufficiency, BPH who presents today to discuss recent Lyme disease lab results and for follow up.   1) Tick Bite: He was last evaluated in July 2023 with a 48-monthhistory of residual, dark, red spots to the sites of 2 tick bites of the lower extremities.  1 tick bite was noted to his left medial thigh and the other to the right anterior shin.  He was tested for Lyme disease in July 2023 which was negative (showed reactivity to 4 borrelial proteins).  He underwent repeat lab testing in October 2023 which revealed the same.  2) BPH: Following with [facility], history of TURP in October 2021. He recently discontinued his finasteride 5 mg per his preference, has been off for about one month. He denies difficulty urinating. He is getting up during the night once to urinate.   3) Right Calf Pain: Chronic. Continued without improvement but without worsening symptoms. He underwent venous ultrasound in July 2023 which was negative for DVT. Previously following with sports medicine, and during his visit in July it was recommended he follow back up. He has yet to do so due to some family matters. He did complete about 3 sessions of PT in August 2023 prior to having to tend to his family matters.   He denies lower back pain, numbness, lower extremity weakness. He will take Tylenol on occasion with improvement.   Review of Systems  Genitourinary:  Negative for difficulty urinating and dysuria.  Musculoskeletal:  Positive for myalgias.  Skin:  Negative for color change.         Past Medical History:  Diagnosis Date   History of bronchitis    Varicose veins     Social History   Socioeconomic History   Marital status: Married    Spouse name: Not on file   Number of children: Not  on file   Years of education: Not on file   Highest education level: Not on file  Occupational History   Not on file  Tobacco Use   Smoking status: Never   Smokeless tobacco: Never  Vaping Use   Vaping Use: Never used  Substance and Sexual Activity   Alcohol use: Yes    Alcohol/week: 1.0 - 2.0 standard drink of alcohol    Types: 1 - 2 Cans of beer per week   Drug use: No   Sexual activity: Not on file  Other Topics Concern   Not on file  Social History Narrative   Not on file   Social Determinants of Health   Financial Resource Strain: Low Risk  (05/09/2019)   Overall Financial Resource Strain (CARDIA)    Difficulty of Paying Living Expenses: Not hard at all  Food Insecurity: No Food Insecurity (05/09/2019)   Hunger Vital Sign    Worried About Running Out of Food in the Last Year: Never true    Ran Out of Food in the Last Year: Never true  Transportation Needs: No Transportation Needs (05/09/2019)   PRAPARE - THydrologist(Medical): No    Lack of Transportation (Non-Medical): No  Physical Activity: Inactive (05/09/2019)   Exercise Vital Sign    Days of Exercise per Week: 0 days    Minutes  of Exercise per Session: 0 min  Stress: No Stress Concern Present (05/09/2019)   [place]    Feeling of Stress : Not at all  Social Connections: Not on file  Intimate Partner Violence: Not At Risk (05/09/2019)   Humiliation, Afraid, Rape, and Kick questionnaire    Fear of Current or Ex-Partner: No    Emotionally Abused: No    Physically Abused: No    Sexually Abused: No    Past Surgical History:  Procedure Laterality Date   APPENDECTOMY     CYSTOSCOPY N/A 04/22/2020   Procedure: CYSTOSCOPY;  Surgeon: [name], MD;  Location: [facility];  Service: Urology;  Laterality: N/A;   finger torn tendon laceration     TONSILLECTOMY     TRANSURETHRAL RESECTION OF PROSTATE N/A 04/22/2020    Procedure: TRANSURETHRAL RESECTION OF THE PROSTATE (TURP);  Surgeon: [name], MD;  Location: [facility];  Service: Urology;  Laterality: N/A;  1 HR    Family History  Problem Relation Age of Onset   Heart attack Father    Kidney disease Mother    Heart disease Mother     Allergies  Allergen Reactions   Codeine Other (See Comments)    unknown    Current Outpatient Medications on File Prior to Visit  Medication Sig Dispense Refill   Cholecalciferol 25 MCG (1000 UT) tablet Take 1,000 Units by mouth daily.      Multiple Vitamins-Minerals (MULTIVITAMIN WITH MINERALS) tablet Take 1 tablet by mouth daily.     vitamin C (ASCORBIC ACID) 500 MG tablet Take 1,000 mg by mouth daily.      No current facility-administered medications on file prior to visit.    BP 138/72   Pulse 65   Temp 97.7 F (36.5 C) (Temporal)   Ht '5\' 10"'$  (1.778 m)   Wt 234 lb (106.1 kg)   SpO2 97%   BMI 33.58 kg/m  Objective:   Physical Exam Cardiovascular:     Rate and Rhythm: Normal rate and regular rhythm.  Pulmonary:     Effort: Pulmonary effort is normal.     Breath sounds: Normal breath sounds. No wheezing or rales.  Musculoskeletal:     Cervical back: Neck supple.  Skin:    General: Skin is warm and dry.  Neurological:     Mental Status: He is alert and oriented to person, place, and time.           Assessment & Plan:   Problem List Items Addressed This Visit       Musculoskeletal and Integument   Tick bite of left thigh    Reviewed Lyme Disease panels from July 2023 and October 2023 reviewed independently and also with patient. We discussed that he does not have active Lyme Disease.   Fortunately, he's asymptomatic.   He had no further questions.         Genitourinary   BPH (benign prostatic hyperplasia)    S/P TURP since 2021.  Controlled.  Remain off finasteride 5 mg.         Other   Right calf pain    Continued symptoms. Negative for DVT in July  2023.  Recommended follow up with sports medicine as previously examined.            [name], NP

## 2022-04-29 NOTE — Patient Instructions (Signed)
It was a pleasure to see you today!   

## 2022-04-29 NOTE — Assessment & Plan Note (Signed)
Continued symptoms. Negative for DVT in July 2023.  Recommended follow up with sports medicine as previously examined.

## 2022-08-11 ENCOUNTER — Encounter: Payer: Self-pay | Admitting: Nurse Practitioner

## 2022-08-11 ENCOUNTER — Ambulatory Visit (INDEPENDENT_AMBULATORY_CARE_PROVIDER_SITE_OTHER): Payer: Medicare Other | Admitting: Nurse Practitioner

## 2022-08-11 VITALS — BP 136/78 | HR 60 | Temp 98.6°F | Ht 70.0 in | Wt 233.0 lb

## 2022-08-11 DIAGNOSIS — R350 Frequency of micturition: Secondary | ICD-10-CM

## 2022-08-11 DIAGNOSIS — N400 Enlarged prostate without lower urinary tract symptoms: Secondary | ICD-10-CM | POA: Diagnosis not present

## 2022-08-11 DIAGNOSIS — R310 Gross hematuria: Secondary | ICD-10-CM

## 2022-08-11 DIAGNOSIS — R3589 Other polyuria: Secondary | ICD-10-CM | POA: Diagnosis not present

## 2022-08-11 DIAGNOSIS — R8290 Unspecified abnormal findings in urine: Secondary | ICD-10-CM

## 2022-08-11 DIAGNOSIS — N410 Acute prostatitis: Secondary | ICD-10-CM

## 2022-08-11 HISTORY — DX: Gross hematuria: R31.0

## 2022-08-11 HISTORY — DX: Frequency of micturition: R35.0

## 2022-08-11 HISTORY — DX: Acute prostatitis: N41.0

## 2022-08-11 LAB — PSA, MEDICARE: PSA: 7.42 ng/ml — ABNORMAL HIGH (ref 0.10–4.00)

## 2022-08-11 LAB — POC URINALSYSI DIPSTICK (AUTOMATED)
Bilirubin, UA: NEGATIVE
Glucose, UA: NEGATIVE
Ketones, UA: NEGATIVE
Leukocytes, UA: NEGATIVE
Nitrite, UA: NEGATIVE
Protein, UA: POSITIVE — AB
Spec Grav, UA: 1.015 (ref 1.010–1.025)
Urobilinogen, UA: 0.2 E.U./dL
pH, UA: 5 (ref 5.0–8.0)

## 2022-08-11 LAB — BASIC METABOLIC PANEL
BUN: 19 mg/dL (ref 6–23)
CO2: 26 mEq/L (ref 19–32)
Calcium: 9.6 mg/dL (ref 8.4–10.5)
Chloride: 104 mEq/L (ref 96–112)
Creatinine, Ser: 1.09 mg/dL (ref 0.40–1.50)
GFR: 64.48 mL/min (ref >60.00)
Glucose, Bld: 95 mg/dL (ref 70–99)
Potassium: 4.5 mEq/L (ref 3.5–5.1)
Sodium: 139 mEq/L (ref 135–145)

## 2022-08-11 LAB — CBC
HCT: 47.9 % (ref 39.0–52.0)
Hemoglobin: 16.8 g/dL (ref 13.0–17.0)
MCHC: 35.1 g/dL (ref 30.0–36.0)
MCV: 90.6 fl (ref 78.0–100.0)
Platelets: 175 10*3/uL (ref 150.0–400.0)
RBC: 5.29 Mil/uL (ref 4.22–5.81)
RDW: 13.2 % (ref 11.5–15.5)
WBC: 6.7 10*3/uL (ref 4.0–10.5)

## 2022-08-11 MED ORDER — SULFAMETHOXAZOLE-TRIMETHOPRIM 800-160 MG PO TABS: 1.0000 | ORAL_TABLET | Freq: Two times a day (BID) | ORAL | 0 refills | Status: AC

## 2022-08-11 NOTE — Assessment & Plan Note (Signed)
UA in office.  Patient does have a history of BPH

## 2022-08-11 NOTE — Patient Instructions (Signed)
Nice to see you today I will be in touch with the labs and urine results when I have them Keep drinking plenty of water in the meantime

## 2022-08-11 NOTE — Progress Notes (Signed)
Acute Office Visit  Subjective:     Patient ID: Cory Parker, male    DOB: 03-15-1941, 82 y.o.   MRN: [id number]  Chief Complaint  Patient presents with   Urinary Frequency    X 1 day with blood in it last night. Large amount of blood in urine. Drank lots of water and now just light pink color in urein      Patient is in today for urinary complaint with a history of elevated PSA, BPH, venous insuffiencey   Symptoms started yesterday States that every 1-1.5 he had to urinate. States that he was drinking water per normal. States that it was clear/a little yellow. States that last night after dinner. He had a urgency and he peed blood. States he called a friend that has a history of UTI and was instructed to drink water. States it chagned from a red to a light pink. States that he did get up twice through the night  Has a hx of BPH and then did a TURP in 2021 through [email or web address]   Review of Systems  Constitutional:  Negative for chills and fever.  Gastrointestinal:  Negative for abdominal pain, nausea and vomiting.  Genitourinary:  Positive for frequency and hematuria. Negative for dysuria.       Nocturia is intermittently urine        Objective:    BP 136/78   Pulse 60   Temp 98.6 F (37 C) (Oral)   Ht '5\' 10"'$  (1.778 m)   Wt 233 lb (105.7 kg)   SpO2 98%   BMI 33.43 kg/m    Physical Exam Vitals and nursing note reviewed. Exam conducted with a chaperone present Claiborne Billings [place], CMA).  Constitutional:      Appearance: Normal appearance.  Cardiovascular:     Rate and Rhythm: Normal rate and regular rhythm.     Heart sounds: Normal heart sounds.  Pulmonary:     Effort: Pulmonary effort is normal.     Breath sounds: Normal breath sounds.  Genitourinary:    Prostate: Normal. Not tender.     Comments: Somewhat boggy on exam  Neurological:     Mental Status: He is alert.     Results for orders placed or performed in visit on 08/11/22  POCT Urinalysis  Dipstick (Automated)  Result Value Ref Range   Color, UA dark orange    Clarity, UA cloudy    Glucose, UA Negative Negative   Bilirubin, UA neg    Ketones, UA neg    Spec Grav, UA 1.015 1.010 - 1.025   Blood, UA 3+    pH, UA 5.0 5.0 - 8.0   Protein, UA Positive (A) Negative   Urobilinogen, UA 0.2 0.2 or 1.0 E.U./dL   Nitrite, UA neg    Leukocytes, UA Negative Negative        Assessment & Plan:   Problem List Items Addressed This Visit       Genitourinary   BPH (benign prostatic hyperplasia)   Relevant Orders   PSA, Medicare   Gross hematuria    No history of kidney stones.  Patient has had TURP in the past along with cystoscopy.  No history of renal cancers or bladder cancers in the family either.  Patient has not smoked in his lifetime.  3+ on UA we will send out for urine culture suspect prostatitis      Relevant Orders   CBC   Basic metabolic panel   Acute prostatitis  Prostate boggy on exam today.  Will like to treat with Bactrim 1 tab twice daily for 10 days pending PSA and urine culture      Relevant Medications   sulfamethoxazole-trimethoprim (BACTRIM DS) 800-160 MG tablet     Other   Abnormal urinalysis    3+ red blood cells in UA, protein pending urine culture      Relevant Orders   Urine Culture   Urinary frequency - Primary    UA in office.  Patient does have a history of BPH      Relevant Orders   POCT Urinalysis Dipstick (Automated) (Completed)    Meds ordered this encounter  Medications   sulfamethoxazole-trimethoprim (BACTRIM DS) 800-160 MG tablet    Sig: Take 1 tablet by mouth 2 (two) times daily for 10 days.    Dispense:  20 tablet    Refill:  0    Order Specific Question:   Supervising Provider    Answer:   TOWER, MARNE A [[id number]    Return if symptoms worsen or fail to improve.  Romilda Garret, NP

## 2022-08-11 NOTE — Assessment & Plan Note (Signed)
Prostate boggy on exam today.  Will like to treat with Bactrim 1 tab twice daily for 10 days pending PSA and urine culture

## 2022-08-11 NOTE — Assessment & Plan Note (Signed)
No history of kidney stones.  Patient has had TURP in the past along with cystoscopy.  No history of renal cancers or bladder cancers in the family either.  Patient has not smoked in his lifetime.  3+ on UA we will send out for urine culture suspect prostatitis

## 2022-08-11 NOTE — Assessment & Plan Note (Signed)
3+ red blood cells in UA, protein pending urine culture

## 2022-08-12 LAB — URINE CULTURE
MICRO NUMBER:: 14532542
Result:: NO GROWTH
SPECIMEN QUALITY:: ADEQUATE

## 2022-08-13 ENCOUNTER — Encounter: Payer: Self-pay | Admitting: Nurse Practitioner

## 2022-08-16 NOTE — Telephone Encounter (Signed)
FYI

## 2022-09-06 DIAGNOSIS — K5730 Diverticulosis of large intestine without perforation or abscess without bleeding: Secondary | ICD-10-CM | POA: Diagnosis not present

## 2022-09-06 DIAGNOSIS — N281 Cyst of kidney, acquired: Secondary | ICD-10-CM | POA: Diagnosis not present

## 2022-09-06 DIAGNOSIS — R310 Gross hematuria: Secondary | ICD-10-CM | POA: Diagnosis not present

## 2022-09-08 DIAGNOSIS — R3914 Feeling of incomplete bladder emptying: Secondary | ICD-10-CM | POA: Diagnosis not present

## 2022-09-08 DIAGNOSIS — R310 Gross hematuria: Secondary | ICD-10-CM | POA: Diagnosis not present

## 2022-09-24 ENCOUNTER — Encounter: Payer: Self-pay | Admitting: Primary Care

## 2022-09-24 ENCOUNTER — Ambulatory Visit (INDEPENDENT_AMBULATORY_CARE_PROVIDER_SITE_OTHER): Payer: Medicare Other | Admitting: Primary Care

## 2022-09-24 VITALS — BP 136/78 | HR 70 | Temp 97.9°F | Ht 70.0 in | Wt 232.0 lb

## 2022-09-24 DIAGNOSIS — M25561 Pain in right knee: Secondary | ICD-10-CM

## 2022-09-24 HISTORY — DX: Pain in right knee: M25.561

## 2022-09-24 MED ORDER — PREDNISONE 20 MG PO TABS: ORAL_TABLET | ORAL | 0 refills | Status: DC

## 2022-09-24 MED ORDER — KETOROLAC TROMETHAMINE 60 MG/2ML IM SOLN
60.0000 mg | Freq: Once | INTRAMUSCULAR | Status: AC
  Administered 2022-09-24: 60 mg via INTRAMUSCULAR

## 2022-09-24 NOTE — Patient Instructions (Signed)
Stop by the lab prior to leaving today. I will notify you of your results once received.   Start prednisone for knee swelling/inflammation. Take three tablets my mouth once daily in the morning for three days, then two tablets for three days, then one tablet for three days.  Start Tylenol at bedtime.  Consider using a knee sleeve for support.   It was a pleasure to see you today!

## 2022-09-24 NOTE — Assessment & Plan Note (Signed)
Exam today representative of arthritis flare.  Will rule out septic joint and gout.  Start prednisone course for inflammation and swelling. IM Toradol 60 mg provided today.  Discussed use of Tylenol HS. Discussed use of knee sleeve, stretching.

## 2022-09-24 NOTE — Progress Notes (Signed)
Subjective:    Patient ID: Cory Parker, male    DOB: 02-Dec-1940, 82 y.o.   MRN: [id number]  Knee Pain  Pertinent negatives include no numbness.    Cory Parker is a very pleasant 82 y.o. male with a history of right calf muscle strain, left lower extremity pain, venous insufficiency who presents today to discuss knee pain.   His pain is located to the right medial knee which began about 4 days ago. The day prior to symptom onset he went for a walk on a local trail for about 2 miles. His pain is worse at night when laying in bed and with extension. He denies falling, tripping.   Since symptom onset his pain has progressed and he's developed swelling to the anterior knee. He's taken Tylenol on occasion with a slight improvement.    Review of Systems  Musculoskeletal:  Positive for arthralgias and joint swelling.  Neurological:  Negative for weakness and numbness.         Past Medical History:  Diagnosis Date   History of bronchitis    Varicose veins     Social History   Socioeconomic History   Marital status: Married    Spouse name: Not on file   Number of children: Not on file   Years of education: Not on file   Highest education level: Not on file  Occupational History   Not on file  Tobacco Use   Smoking status: Never   Smokeless tobacco: Never  Vaping Use   Vaping Use: Never used  Substance and Sexual Activity   Alcohol use: Yes    Alcohol/week: 1.0 - 2.0 standard drink of alcohol    Types: 1 - 2 Cans of beer per week   Drug use: No   Sexual activity: Not on file  Other Topics Concern   Not on file  Social History Narrative   Not on file   Social Determinants of Health   Financial Resource Strain: Low Risk  (04/29/2022)   Overall Financial Resource Strain (CARDIA)    Difficulty of Paying Living Expenses: Not hard at all  Food Insecurity: No Food Insecurity (04/29/2022)   Hunger Vital Sign    Worried About Running Out of Food in the Last Year:  Never true    Ran Out of Food in the Last Year: Never true  Transportation Needs: No Transportation Needs (04/29/2022)   PRAPARE - Hydrologist (Medical): No    Lack of Transportation (Non-Medical): No  Physical Activity: Insufficiently Active (04/29/2022)   Exercise Vital Sign    Days of Exercise per Week: 3 days    Minutes of Exercise per Session: 30 min  Stress: No Stress Concern Present (04/29/2022)   [place]    Feeling of Stress : Not at all  Social Connections: Moderately Integrated (04/29/2022)   Social Connection and Isolation Panel [NHANES]    Frequency of Communication with Friends and Family: Three times a week    Frequency of Social Gatherings with Friends and Family: Three times a week    Attends Religious Services: Never    Active Member of Clubs or Organizations: Yes    Attends Archivist Meetings: Not on file    Marital Status: Married  Intimate Partner Violence: Not At Risk (04/29/2022)   Humiliation, Afraid, Rape, and Kick questionnaire    Fear of Current or Ex-Partner: No    Emotionally Abused:  No    Physically Abused: No    Sexually Abused: No    Past Surgical History:  Procedure Laterality Date   APPENDECTOMY     CYSTOSCOPY N/A 04/22/2020   Procedure: CYSTOSCOPY;  Surgeon: [name], MD;  Location: [facility];  Service: Urology;  Laterality: N/A;   finger torn tendon laceration     TONSILLECTOMY     TRANSURETHRAL RESECTION OF PROSTATE N/A 04/22/2020   Procedure: TRANSURETHRAL RESECTION OF THE PROSTATE (TURP);  Surgeon: [name], MD;  Location: [facility];  Service: Urology;  Laterality: N/A;  1 HR    Family History  Problem Relation Age of Onset   Heart attack Father    Kidney disease Mother    Heart disease Mother     Allergies  Allergen Reactions   Codeine Other (See Comments)    unknown    Current Outpatient Medications on  File Prior to Visit  Medication Sig Dispense Refill   Cholecalciferol 25 MCG (1000 UT) tablet Take 1,000 Units by mouth daily.      finasteride (PROSCAR) 5 MG tablet      Multiple Vitamins-Minerals (MULTIVITAMIN WITH MINERALS) tablet Take 1 tablet by mouth daily.     vitamin C (ASCORBIC ACID) 500 MG tablet Take 1,000 mg by mouth daily.      No current facility-administered medications on file prior to visit.    BP 136/78   Pulse 70   Temp 97.9 F (36.6 C) (Temporal)   Ht 5\' 10"  (1.778 m)   Wt 232 lb (105.2 kg)   SpO2 99%   BMI 33.29 kg/m  Objective:   Physical Exam Constitutional:      General: He is not in acute distress. Musculoskeletal:     Right knee: Swelling present. No erythema. Decreased range of motion.     Left knee: No swelling or erythema.     Comments: Ambulates well overall without assistance  Skin:    General: Skin is warm and dry.     Findings: No erythema.           Assessment & Plan:  Acute pain of right knee Assessment & Plan: Exam today representative of arthritis flare.  Will rule out septic joint and gout.  Start prednisone course for inflammation and swelling. IM Toradol 60 mg provided today.  Discussed use of Tylenol HS. Discussed use of knee sleeve, stretching.   Orders: -     predniSONE; Take three tablets my mouth once daily in the morning for three days, then two tablets for three days, then one tablet for three days.  Dispense: 18 tablet; Refill: 0 -     CBC with Differential/Platelet -     Uric acid        [name], NP

## 2022-09-24 NOTE — Addendum Note (Signed)
Addended by: Pat Kocher on: 09/24/2022 03:32 PM   Modules accepted: Orders

## 2022-09-25 LAB — CBC WITH DIFFERENTIAL/PLATELET
Absolute Monocytes: 611 cells/uL (ref 200–950)
Basophils Absolute: 71 cells/uL (ref 0–200)
Basophils Relative: 1 %
Eosinophils Absolute: 170 cells/uL (ref 15–500)
Eosinophils Relative: 2.4 %
HCT: 45.2 % (ref 38.5–50.0)
Hemoglobin: 16 g/dL (ref 13.2–17.1)
Lymphs Abs: 2244 cells/uL (ref 850–3900)
MCH: 31.6 pg (ref 27.0–33.0)
MCHC: 35.4 g/dL (ref 32.0–36.0)
MCV: 89.2 fL (ref 80.0–100.0)
MPV: 11 fL (ref 7.5–12.5)
Monocytes Relative: 8.6 %
Neutro Abs: 4004 cells/uL (ref 1500–7800)
Neutrophils Relative %: 56.4 %
Platelets: 207 10*3/uL (ref 140–400)
RBC: 5.07 10*6/uL (ref 4.20–5.80)
RDW: 12.7 % (ref 11.0–15.0)
Total Lymphocyte: 31.6 %
WBC: 7.1 10*3/uL (ref 3.8–10.8)

## 2022-09-25 LAB — URIC ACID: Uric Acid, Serum: 5.6 mg/dL (ref 4.0–8.0)

## 2022-10-31 DIAGNOSIS — R059 Cough, unspecified: Secondary | ICD-10-CM | POA: Diagnosis not present

## 2022-10-31 DIAGNOSIS — Z8709 Personal history of other diseases of the respiratory system: Secondary | ICD-10-CM | POA: Diagnosis not present

## 2022-10-31 DIAGNOSIS — R030 Elevated blood-pressure reading, without diagnosis of hypertension: Secondary | ICD-10-CM | POA: Diagnosis not present

## 2022-10-31 DIAGNOSIS — J329 Chronic sinusitis, unspecified: Secondary | ICD-10-CM | POA: Diagnosis not present

## 2023-02-08 DIAGNOSIS — H25813 Combined forms of age-related cataract, bilateral: Secondary | ICD-10-CM | POA: Diagnosis not present

## 2023-03-25 DIAGNOSIS — R350 Frequency of micturition: Secondary | ICD-10-CM | POA: Diagnosis not present

## 2023-03-25 DIAGNOSIS — R310 Gross hematuria: Secondary | ICD-10-CM | POA: Diagnosis not present

## 2023-05-11 ENCOUNTER — Ambulatory Visit: Payer: Medicare Other

## 2023-05-11 VITALS — Ht 70.0 in | Wt 232.0 lb

## 2023-05-11 DIAGNOSIS — R8290 Unspecified abnormal findings in urine: Secondary | ICD-10-CM | POA: Diagnosis not present

## 2023-05-11 DIAGNOSIS — Z0000 Encounter for general adult medical examination without abnormal findings: Secondary | ICD-10-CM

## 2023-05-11 NOTE — Patient Instructions (Signed)
Cory Parker , Thank you for taking time to come for your Medicare Wellness Visit. I appreciate your ongoing commitment to your health goals. Please review the following plan we discussed and let me know if I can assist you in the future.   Referrals/Orders/Follow-Ups/Clinician Recommendations: none  This is a list of the screening recommended for you and due dates:  Health Maintenance  Topic Date Due   Zoster (Shingles) Vaccine (1 of 2) 12/14/1992   Flu Shot  02/03/2023   COVID-19 Vaccine (4 - 2023-24 season) 03/06/2023   DTaP/Tdap/Td vaccine (3 - Tdap) 03/28/2024   Medicare Annual Wellness Visit  05/10/2024   Pneumonia Vaccine  Completed   HPV Vaccine  Aged Out   Colon Cancer Screening  Discontinued    Advanced directives: (Copy Requested) Please bring a copy of your health care power of attorney and living will to the office to be added to your chart at your convenience.  Next Medicare Annual Wellness Visit scheduled for next year: Yes 05/11/24 @ 3:40pm telephone

## 2023-05-11 NOTE — Progress Notes (Signed)
Subjective:   Cory Parker is a 82 y.o. male who presents for Medicare Annual/Subsequent preventive examination.  Visit Complete: Virtual I connected with  Cory Parker on 05/11/23 by a audio enabled telemedicine application and verified that I am speaking with the correct person using two identifiers.  Patient Location: Home  Provider Location: Office/Clinic  I discussed the limitations of evaluation and management by telemedicine. The patient expressed understanding and agreed to proceed.  Vital Signs: Because this visit was a virtual/telehealth visit, some criteria may be missing or patient reported. Any vitals not documented were not able to be obtained and vitals that have been documented are patient reported.  Patient Medicare AWV questionnaire was completed by the patient on 05/10/23; I have confirmed that all information answered by patient is correct and no changes since this date. Cardiac Risk Factors include: advanced age (>16men, >5 women);obesity (BMI >30kg/m2)    Objective:    Today's Vitals   05/11/23 1431  Weight: 232 lb (105.2 kg)  Height: 5\' 10"  (1.778 m)   Body mass index is 33.29 kg/m.     05/11/2023    2:38 PM 02/02/2022    1:58 PM 04/22/2020    9:51 AM 04/16/2020    8:13 AM 02/16/2020    1:15 PM 02/09/2020    2:58 PM 05/09/2019    9:12 AM  Advanced Directives  Does Patient Have a Medical Advance Directive? Yes Yes Yes Yes Yes Yes Yes  Type of Estate agent of [place];Living will Healthcare Power of [employer] of [place];Living will Healthcare Power of [place];Living will Healthcare Power of [employer] Power of [employer] Power of [place];Living will  Does patient want to make changes to medical advance directive?   No - Patient declined No - Patient declined No - Patient declined    Copy of Healthcare Power of Attorney in Chart? No - copy requested Yes - validated most recent copy scanned in  chart (See row information)  No - copy requested   No - copy requested    Current Medications (verified) Outpatient Encounter Medications as of 05/11/2023  Medication Sig   Cholecalciferol 25 MCG (1000 UT) tablet Take 1,000 Units by mouth daily.    finasteride (PROSCAR) 5 MG tablet    Multiple Vitamins-Minerals (MULTIVITAMIN WITH MINERALS) tablet Take 1 tablet by mouth daily.   vitamin C (ASCORBIC ACID) 500 MG tablet Take 1,000 mg by mouth daily.    predniSONE (DELTASONE) 20 MG tablet Take three tablets my mouth once daily in the morning for three days, then two tablets for three days, then one tablet for three days. (Patient not taking: Reported on 05/11/2023)   No facility-administered encounter medications on file as of 05/11/2023.    Allergies (verified) Codeine   History: Past Medical History:  Diagnosis Date   History of bronchitis    Varicose veins    Past Surgical History:  Procedure Laterality Date   APPENDECTOMY     CYSTOSCOPY N/A 04/22/2020   Procedure: CYSTOSCOPY;  Surgeon: Belva Agee, MD;  Location: [facility];  Service: Urology;  Laterality: N/A;   finger torn tendon laceration     TONSILLECTOMY     TRANSURETHRAL RESECTION OF PROSTATE N/A 04/22/2020   Procedure: TRANSURETHRAL RESECTION OF THE PROSTATE (TURP);  Surgeon: Belva Agee, MD;  Location: [facility];  Service: Urology;  Laterality: N/A;  1 HR   Family History  Problem Relation Age of Onset   Heart attack Father  Kidney disease Mother    Heart disease Mother    Social History   Socioeconomic History   Marital status: Married    Spouse name: Not on file   Number of children: Not on file   Years of education: Not on file   Highest education level: Not on file  Occupational History   Not on file  Tobacco Use   Smoking status: Never   Smokeless tobacco: Never  Vaping Use   Vaping status: Never Used  Substance and Sexual Activity   Alcohol use: Yes    Alcohol/week: 1.0 - 2.0 standard drink  of alcohol    Types: 1 - 2 Cans of beer per week   Drug use: No   Sexual activity: Not on file  Other Topics Concern   Not on file  Social History Narrative   Not on file   Social Determinants of Health   Financial Resource Strain: Patient Declined (05/10/2023)   Overall Financial Resource Strain (CARDIA)    Difficulty of Paying Living Expenses: Patient declined  Food Insecurity: Patient Declined (05/10/2023)   Hunger Vital Sign    Worried About Running Out of Food in the Last Year: Patient declined    Ran Out of Food in the Last Year: Patient declined  Transportation Needs: No Transportation Needs (05/10/2023)   PRAPARE - Administrator, Civil Service (Medical): No    Lack of Transportation (Non-Medical): No  Physical Activity: Unknown (05/10/2023)   Exercise Vital Sign    Days of Exercise per Week: 3 days    Minutes of Exercise per Session: Patient declined  Stress: Patient Declined (05/10/2023)   [employer] of Occupational Health - Occupational Stress Questionnaire    Feeling of Stress : Patient declined  Social Connections: Unknown (05/10/2023)   Social Connection and Isolation Panel [NHANES]    Frequency of Communication with Friends and Family: Patient declined    Frequency of Social Gatherings with Friends and Family: Patient declined    Attends Religious Services: Not on Marketing executive or Organizations: Patient declined    Attends Banker Meetings: Patient declined    Marital Status: Married    Tobacco Counseling Counseling given: Not Answered   Clinical Intake:  Pre-visit preparation completed: No  Pain : No/denies pain     BMI - recorded: 33.29 Nutritional Status: BMI > 30  Obese Nutritional Risks: None Diabetes: No  How often do you need to have someone help you when you read instructions, pamphlets, or other written materials from your doctor or pharmacy?: 2 - Rarely  Interpreter Needed?: No  Comments:  lives with wife and dog Information entered by :: B.Alexys Lobello,LPN   Activities of Daily Living    05/10/2023    3:37 PM  In your present state of health, do you have any difficulty performing the following activities:  Hearing? 0  Vision? 0  Difficulty concentrating or making decisions? 0  Walking or climbing stairs? 0  Dressing or bathing? 0  Doing errands, shopping? 0  Preparing Food and eating ? N  Using the Toilet? N  In the past six months, have you accidently leaked urine? N  Do you have problems with loss of bowel control? N  Managing your Medications? N  Managing your Finances? N  Housekeeping or managing your Housekeeping? N    Patient Care Team: Doreene Nest, NP as PCP - General (Internal Medicine)  Indicate any recent Medical Services you may  have received from other than [facility] providers in the past year (date may be approximate).     Assessment:   This is a routine wellness examination for Cory Parker.  Hearing/Vision screen Hearing Screening - Comments:: Pt says his hearing is good with hearing aids; will change out soon Vision Screening - Comments:: Pt says is eye sight is good [facility]-seen 8/23   Goals Addressed             This Visit's Progress    COMPLETED: Increase physical activity   On track    Starting 05/03/2018, I will continue to walk 60 minutes 5 days per week.      COMPLETED: Increase physical activity   On track    Continue lifestyle     Patient Stated   On track    05/09/2019, I will try to increase my exercise level as soon as the pandemic gets under control.        Depression Screen    05/11/2023    2:36 PM 04/29/2022    1:08 PM 05/09/2019    9:12 AM 05/03/2018    1:21 PM 03/28/2014    2:50 PM  PHQ 2/9 Scores  PHQ - 2 Score 0 0 0 0 0  PHQ- 9 Score  1 0 0     Fall Risk    05/10/2023    3:37 PM 09/24/2022    2:51 PM 04/28/2022    4:41 PM 04/16/2021   10:50 AM 05/09/2019    9:12 AM  Fall Risk   Falls in the past  year? 0 0 0 0 0  Number falls in past yr: 0 0 0 0 0  Injury with Fall? 0 0 0 0 0  Risk for fall due to : No Fall Risks No Fall Risks   Medication side effect  Follow up Education provided;Falls prevention discussed Falls evaluation completed   Falls evaluation completed;Falls prevention discussed    MEDICARE RISK AT HOME: Medicare Risk at Home Any stairs in or around the home?: Yes If so, are there any without handrails?: No Home free of loose throw rugs in walkways, pet beds, electrical cords, etc?: No Adequate lighting in your home to reduce risk of falls?: Yes Life alert?: No Use of a cane, walker or w/c?: No Grab bars in the bathroom?: No Shower chair or bench in shower?: No Elevated toilet seat or a handicapped toilet?: Yes  TIMED UP AND GO:  Was the test performed?  No    Cognitive Function:    05/09/2019    9:14 AM 05/03/2018   12:36 PM  MMSE - Mini Mental State Exam  Orientation to time 5 5  Orientation to Place 5 5  Registration 3 3  Attention/ Calculation 5 0  Recall 3 3  Language- name 2 objects  0  Language- repeat 1 1  Language- follow 3 step command  3  Language- read & follow direction  0  Write a sentence  0  Copy design  0  Total score  20        05/11/2023    2:44 PM 04/29/2022    1:06 PM  6CIT Screen  What Year? 0 points 0 points  What month? 0 points 0 points  What time? 0 points 0 points  Count back from 20 0 points 0 points  Months in reverse 0 points 0 points  Repeat phrase 0 points 0 points  Total Score 0 points 0 points    Immunizations  Immunization History  Administered Date(s) Administered   Fluad Quad(high Dose 65+) 04/15/2021, 03/09/2022   Influenza Split 04/15/2011   Influenza, High Dose Seasonal PF 05/09/2015   Influenza,inj,Quad PF,6+ Mos 03/30/2013, 03/28/2014, 04/12/2016, 04/29/2017, 05/09/2018, 05/15/2020   Influenza-Unspecified 03/06/2019   PFIZER(Purple Top)SARS-COV-2 Vaccination 09/01/2019, 09/23/2019   Pfizer  Covid-19 Vaccine Bivalent Booster 42yrs & up 07/10/2021   Pneumococcal Conjugate-13 03/28/2014   Pneumococcal Polysaccharide-23 02/18/2010, 03/30/2013   Td 07/05/2001   Tetanus 03/28/2014   Zoster, Live 04/01/2012    TDAP status: Up to date  Flu Vaccine status: Up to date Sept 2024 at pharmacy  Pneumococcal vaccine status: Up to date  Covid-19 vaccine status: Completed vaccines  Qualifies for Shingles Vaccine? Yes   Zostavax completed No   Shingrix Completed?: No.    Education has been provided regarding the importance of this vaccine. Patient has been advised to call insurance company to determine out of pocket expense if they have not yet received this vaccine. Advised may also receive vaccine at local pharmacy or Health Dept. Verbalized acceptance and understanding.  Screening Tests Health Maintenance  Topic Date Due   Zoster Vaccines- Shingrix (1 of 2) 12/14/1992   INFLUENZA VACCINE  02/03/2023   COVID-19 Vaccine (4 - 2023-24 season) 03/06/2023   DTaP/Tdap/Td (3 - Tdap) 03/28/2024   Medicare Annual Wellness (AWV)  05/10/2024   Pneumonia Vaccine 74+ Years old  Completed   HPV VACCINES  Aged Out   Colonoscopy  Discontinued    Health Maintenance  Health Maintenance Due  Topic Date Due   Zoster Vaccines- Shingrix (1 of 2) 12/14/1992   INFLUENZA VACCINE  02/03/2023   COVID-19 Vaccine (4 - 2023-24 season) 03/06/2023    Colorectal cancer screening: No longer required.   Lung Cancer Screening: (Low Dose CT Chest recommended if Age 21-80 years, 20 pack-year currently smoking OR have quit w/in 15years.) does not qualify.   Lung Cancer Screening Referral: no  Additional Screening:  Hepatitis C Screening: does not qualify; Completed no  Vision Screening: Recommended annual ophthalmology exams for early detection of glaucoma and other disorders of the eye. Is the patient up to date with their annual eye exam?  Yes  Who is the provider or what is the name of the office in  which the patient attends annual eye exams? [facility] If pt is not established with a provider, would they like to be referred to a provider to establish care? No .   Dental Screening: Recommended annual dental exams for proper oral hygiene  Diabetic Foot Exam: n/a  Community Resource Referral / Chronic Care Management: CRR required this visit?  No   CCM required this visit?  No    Plan:     I have personally reviewed and noted the following in the patient's chart:   Medical and social history Use of alcohol, tobacco or illicit drugs  Current medications and supplements including opioid prescriptions. Patient is not currently taking opioid prescriptions. Functional ability and status Nutritional status Physical activity Advanced directives List of other physicians Hospitalizations, surgeries, and ER visits in previous 12 months Vitals Screenings to include cognitive, depression, and falls Referrals and appointments  In addition, I have reviewed and discussed with patient certain preventive protocols, quality metrics, and best practice recommendations. A written personalized care plan for preventive services as well as general preventive health recommendations were provided to patient.    Sue Lush, LPN   16/07/958   After Visit Summary: (MyChart) Due to this being a  telephonic visit, the after visit summary with patients personalized plan was offered to patient via MyChart   Nurse Notes: The patient states he is doing well and has no concerns or questions at this time.Pt is due for AWV with PCP. Pt declined to schedule says he will call back or make online.

## 2023-06-24 ENCOUNTER — Ambulatory Visit (INDEPENDENT_AMBULATORY_CARE_PROVIDER_SITE_OTHER): Payer: Medicare Other | Admitting: Nurse Practitioner

## 2023-06-24 ENCOUNTER — Encounter: Payer: Self-pay | Admitting: Nurse Practitioner

## 2023-06-24 VITALS — BP 124/80 | HR 59 | Temp 98.3°F | Ht 70.0 in | Wt 232.6 lb

## 2023-06-24 DIAGNOSIS — R0982 Postnasal drip: Secondary | ICD-10-CM | POA: Insufficient documentation

## 2023-06-24 DIAGNOSIS — U071 COVID-19: Secondary | ICD-10-CM

## 2023-06-24 DIAGNOSIS — R52 Pain, unspecified: Secondary | ICD-10-CM | POA: Insufficient documentation

## 2023-06-24 DIAGNOSIS — R051 Acute cough: Secondary | ICD-10-CM | POA: Insufficient documentation

## 2023-06-24 HISTORY — DX: COVID-19: U07.1

## 2023-06-24 LAB — POC COVID19 BINAXNOW: SARS Coronavirus 2 Ag: POSITIVE — AB

## 2023-06-24 MED ORDER — NIRMATRELVIR/RITONAVIR (PAXLOVID)TABLET: 3.0000 | ORAL_TABLET | Freq: Two times a day (BID) | ORAL | 0 refills | Status: AC

## 2023-06-24 MED ORDER — BENZONATATE 100 MG PO CAPS: 100.0000 mg | ORAL_CAPSULE | Freq: Three times a day (TID) | ORAL | 0 refills | Status: DC | PRN

## 2023-06-24 MED ORDER — MOMETASONE FUROATE 50 MCG/ACT NA SUSP: 2.0000 | Freq: Every day | NASAL | 0 refills | Status: AC

## 2023-06-24 NOTE — Assessment & Plan Note (Signed)
COVID test in office.  Patient can use over-the-counter analgesics such as Tylenol as directed

## 2023-06-24 NOTE — Patient Instructions (Signed)
Nice to see you today Your covid test came back positive I have sent several medications to the pharmacy  If you do not start improving by early next week follow up with the office It is ok to take tylenol as needed Rest and drink plenty of water

## 2023-06-24 NOTE — Assessment & Plan Note (Signed)
COVID test in office.  Patient use Tessalon Perles 100 mg 3 times daily as needed

## 2023-06-24 NOTE — Assessment & Plan Note (Addendum)
Will do Nasonex 50 mcg as directed.  Encourage patient to sleep with an elevated head of bed

## 2023-06-24 NOTE — Assessment & Plan Note (Signed)
COVID test positive in office.  Will write patient Paxlovid twice daily for 5 days.  Most recent renal function reviewed.  Did review CDC guidelines/recommendations in regards to self-isolation/quarantine.  Patient drink plenty of fluid.  Signs symptoms reviewed when to seek urgent or emergent health care

## 2023-06-24 NOTE — Progress Notes (Signed)
Acute Office Visit  Subjective:     Patient ID: Cory Parker, male    DOB: Mar 14, 1941, 82 y.o.   MRN: [id number]  Chief Complaint  Patient presents with   Cough    Pt complains of cough that started yesterday. Scratchy throat and sinus pressure. States that he is coughing up clear mucus      Patient is in today for cough with a history of venous insufficiency, BPH.  No history of smoking  Symptoms started yesterday 06/23/2023 COVID-vaccine: Original with booster Flu vaccine: Up-to-date States that  he still works and around folks in the office   States he took some tylenol that seemed to help   Review of Systems  Constitutional:  Positive for malaise/fatigue. Negative for chills and fever.  HENT:  Positive for congestion. Negative for ear discharge, ear pain, sinus pain and sore throat (scratchy).   Respiratory:  Positive for cough and sputum production (clear).   Gastrointestinal:  Negative for abdominal pain, diarrhea, nausea and vomiting.  Musculoskeletal:  Positive for myalgias. Negative for joint pain.  Neurological:  Negative for headaches.        Objective:    BP 124/80   Pulse (!) 59   Temp 98.3 F (36.8 C) (Oral)   Ht 5\' 10"  (1.778 m)   Wt 232 lb 9.6 oz (105.5 kg)   SpO2 99%   BMI 33.37 kg/m  BP Readings from Last 3 Encounters:  06/24/23 124/80  09/24/22 136/78  08/11/22 136/78   Wt Readings from Last 3 Encounters:  06/24/23 232 lb 9.6 oz (105.5 kg)  05/11/23 232 lb (105.2 kg)  09/24/22 232 lb (105.2 kg)   SpO2 Readings from Last 3 Encounters:  06/24/23 99%  09/24/22 99%  08/11/22 98%      Physical Exam Vitals and nursing note reviewed.  Constitutional:      Appearance: Normal appearance.  HENT:     Right Ear: Tympanic membrane, ear canal and external ear normal.     Left Ear: Tympanic membrane, ear canal and external ear normal.     Nose:     Right Sinus: No maxillary sinus tenderness or frontal sinus tenderness.     Left Sinus:  No maxillary sinus tenderness or frontal sinus tenderness.     Mouth/Throat:     Mouth: Mucous membranes are moist.     Pharynx: Oropharynx is clear.  Cardiovascular:     Rate and Rhythm: Normal rate and regular rhythm.     Heart sounds: Normal heart sounds.  Pulmonary:     Effort: Pulmonary effort is normal.     Breath sounds: Normal breath sounds.  Lymphadenopathy:     Cervical: No cervical adenopathy.  Neurological:     Mental Status: He is alert.     Results for orders placed or performed in visit on 06/24/23  POC COVID-19  Result Value Ref Range   SARS Coronavirus 2 Ag Positive (A) Negative        Assessment & Plan:   Problem List Items Addressed This Visit       Other   Acute cough - Primary   COVID test in office.  Patient use Tessalon Perles 100 mg 3 times daily as needed      Relevant Medications   benzonatate (TESSALON) 100 MG capsule   Other Relevant Orders   POC COVID-19 (Completed)   PND (post-nasal drip)   Will do Nasonex 50 mcg as directed.  Encourage patient to sleep with an  elevated head of bed      Relevant Medications   mometasone (NASONEX) 50 MCG/ACT nasal spray   Body aches   COVID test in office.  Patient can use over-the-counter analgesics such as Tylenol as directed      Relevant Orders   POC COVID-19 (Completed)   COVID-19   COVID test positive in office.  Will write patient Paxlovid twice daily for 5 days.  Most recent renal function reviewed.  Did review CDC guidelines/recommendations in regards to self-isolation/quarantine.  Patient drink plenty of fluid.  Signs symptoms reviewed when to seek urgent or emergent health care      Relevant Medications   nirmatrelvir/ritonavir (PAXLOVID) 20 x 150 MG & 10 x 100MG  TABS    Meds ordered this encounter  Medications   nirmatrelvir/ritonavir (PAXLOVID) 20 x 150 MG & 10 x 100MG  TABS    Sig: Take 3 tablets by mouth 2 (two) times daily for 5 days. (Take nirmatrelvir 150 mg two tablets twice  daily for 5 days and ritonavir 100 mg one tablet twice daily for 5 days) Patient GFR is 64    Dispense:  30 tablet    Refill:  0    Supervising Provider:   Roxy Manns A [[id number]]   mometasone (NASONEX) 50 MCG/ACT nasal spray    Sig: Place 2 sprays into the nose daily.    Dispense:  1 each    Refill:  0    Supervising Provider:   Roxy Manns A [[id number]]   benzonatate (TESSALON) 100 MG capsule    Sig: Take 1 capsule (100 mg total) by mouth 3 (three) times daily as needed.    Dispense:  21 capsule    Refill:  0    Supervising Provider:   Roxy Manns A [[id number]]    Return if symptoms worsen or fail to improve.  Audria Nine, NP

## 2023-07-03 ENCOUNTER — Encounter: Payer: Self-pay | Admitting: Nurse Practitioner

## 2023-07-04 MED ORDER — HYDROCODONE BIT-HOMATROP MBR 5-1.5 MG/5ML PO SOLN: 5.0000 mL | Freq: Two times a day (BID) | ORAL | 0 refills | Status: DC | PRN

## 2023-07-04 NOTE — Telephone Encounter (Signed)
Copied from CRM [id number]. Topic: Clinical - Medical Advice >> Jul 04, 2023  8:12 AM Cory Parker wrote: Reason for CRM: Patient was seen by provider Cory Parker on 06/24/2023 and was diagnosed with Covid. The patient called in today requesting to speak with the provider because his cough is getting worse and he needs to know if he should be re seen at the clinic or if the provider can prescribe another medication for his symptoms. Please follow up with patient [phone]

## 2023-07-04 NOTE — Telephone Encounter (Signed)
Has he been taking the tesslon perales? Have they helped at all? Is he having trouble sleeping because of the cough?

## 2023-08-15 ENCOUNTER — Other Ambulatory Visit: Payer: Self-pay | Admitting: Nurse Practitioner

## 2023-08-15 DIAGNOSIS — R0982 Postnasal drip: Secondary | ICD-10-CM

## 2023-10-10 IMAGING — DX DG KNEE COMPLETE 4+V*R*
5 series · 5 of 5 positions shown · non-contrast
Comparison: None.

CLINICAL DATA: Acute right knee pain with swelling, initial
encounter

EXAM:
RIGHT KNEE - COMPLETE 4+ VIEW

[knee ap]
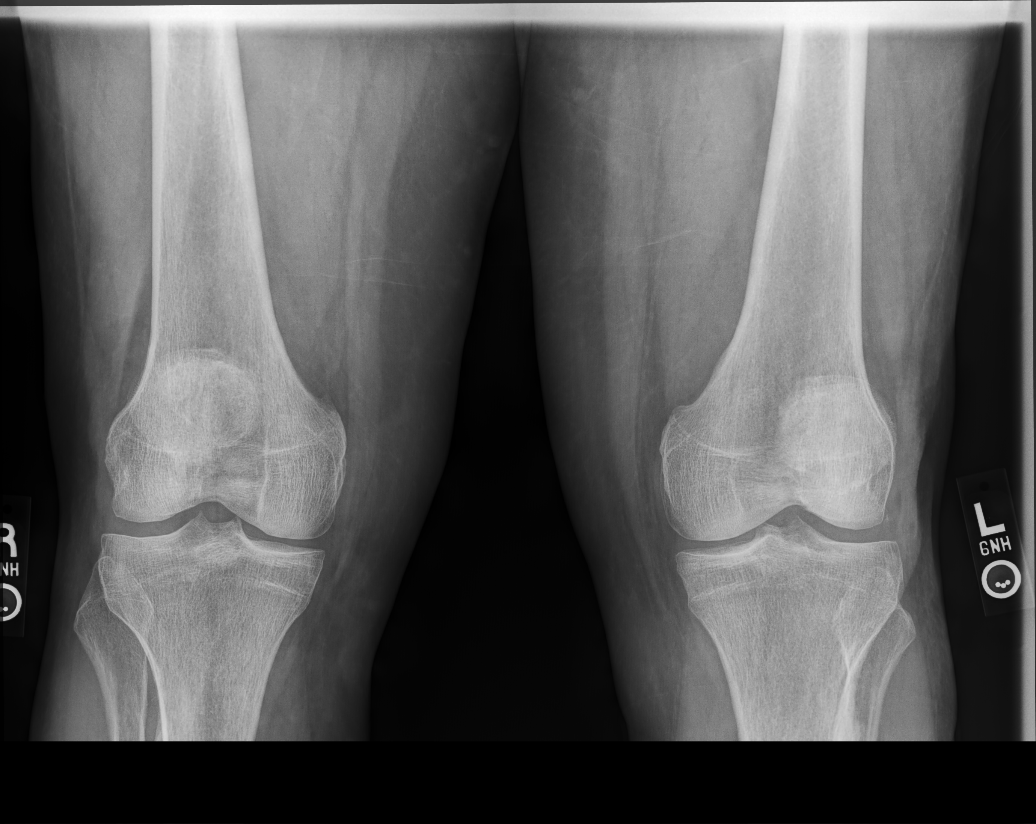

[knee [person_name] view pa]
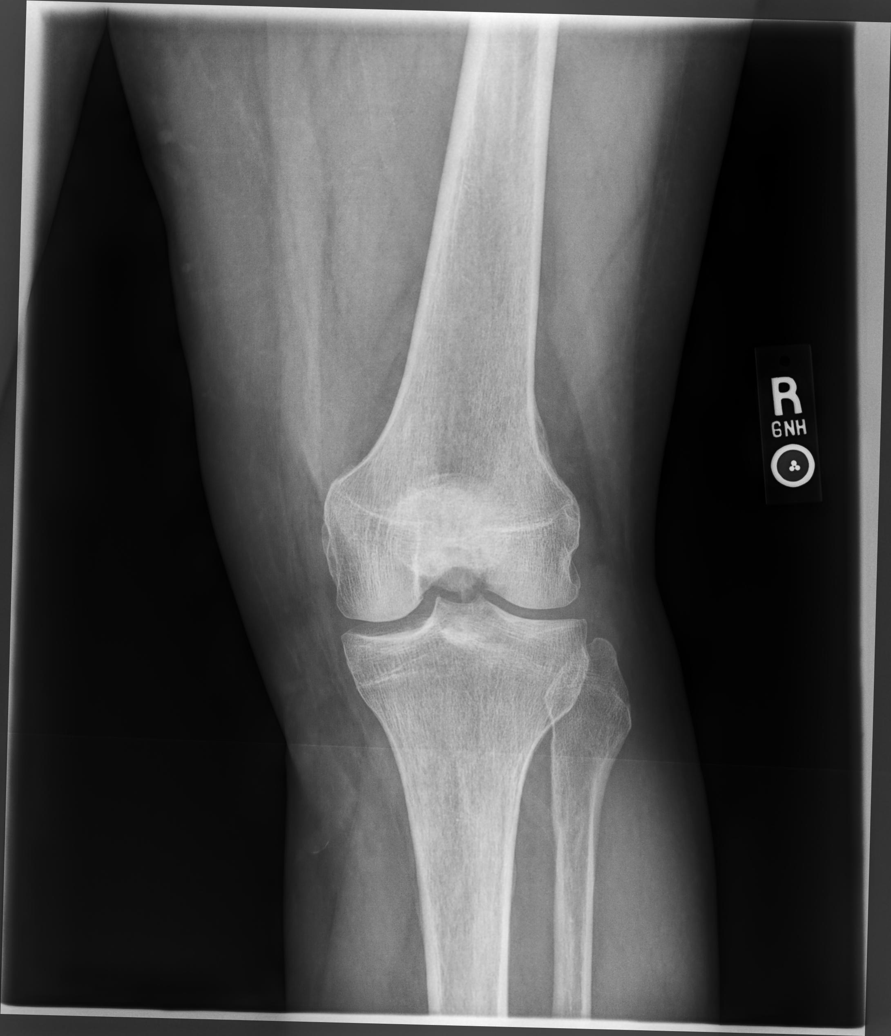

[knee lat (1 of 2)]
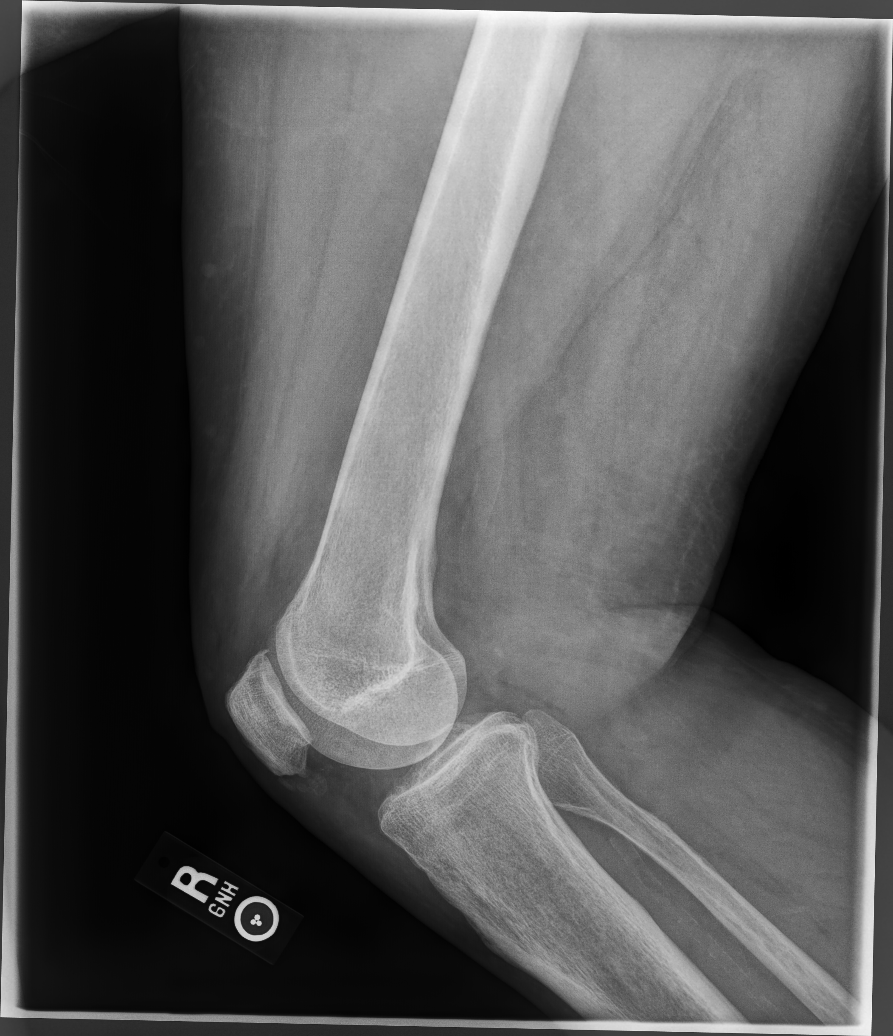

[knee lat (2 of 2)]
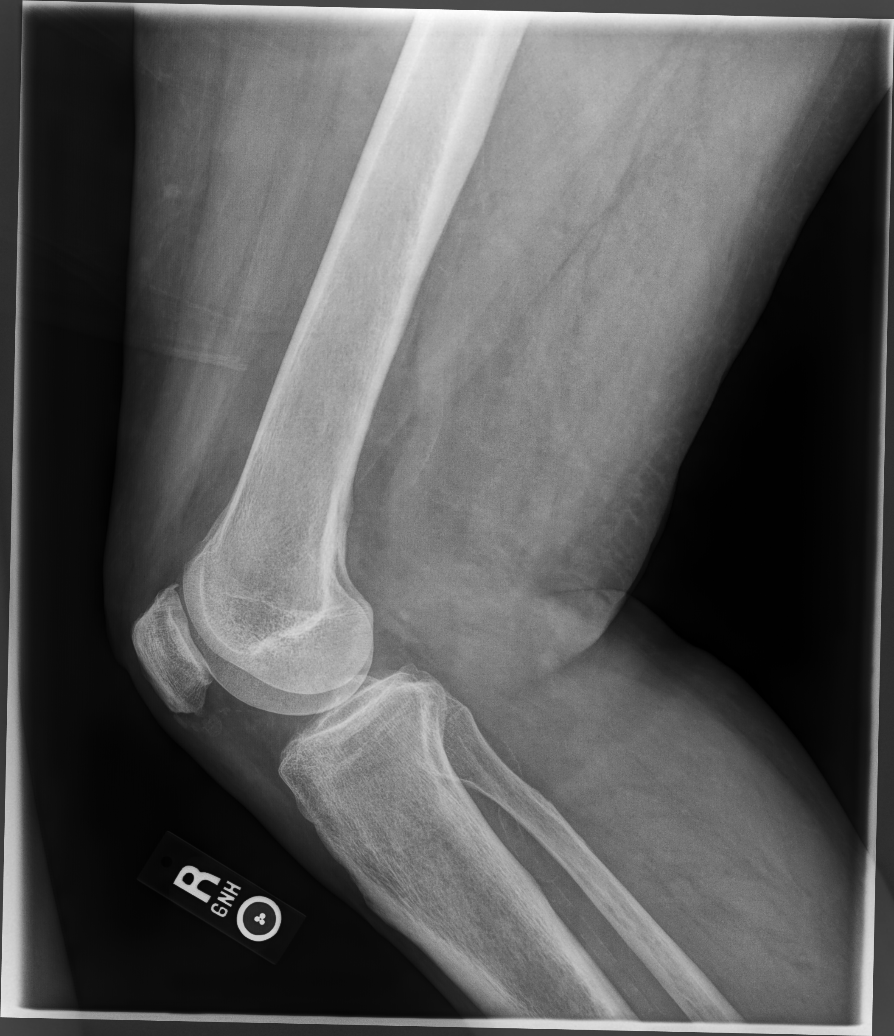

[patella (sunrise)]
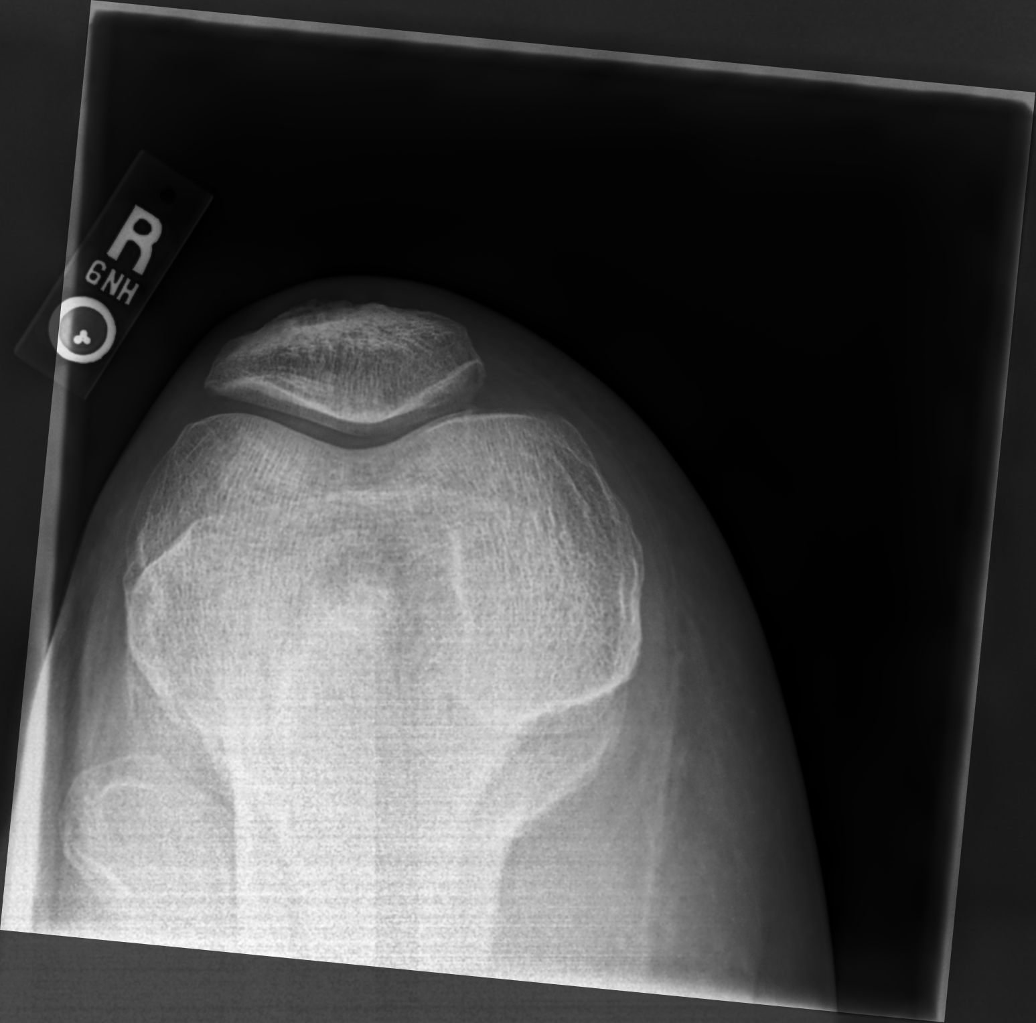

[5 of 5 positions shown; findings below may reference images not displayed]

FINDINGS: No evidence of fracture, dislocation, or joint effusion. No evidence
of arthropathy or other focal bone abnormality. Soft tissues are
unremarkable.
IMPRESSION: No acute abnormality noted.

## 2023-10-24 ENCOUNTER — Ambulatory Visit (INDEPENDENT_AMBULATORY_CARE_PROVIDER_SITE_OTHER): Admitting: Family Medicine

## 2023-10-24 ENCOUNTER — Ambulatory Visit: Payer: Self-pay

## 2023-10-24 VITALS — BP 128/66 | HR 56 | Temp 97.7°F | Ht 70.0 in | Wt 233.0 lb

## 2023-10-24 DIAGNOSIS — S76311A Strain of muscle, fascia and tendon of the posterior muscle group at thigh level, right thigh, initial encounter: Secondary | ICD-10-CM

## 2023-10-24 NOTE — Telephone Encounter (Signed)
 Patient seeing Dr. Vallarie Gauze today at 10 am.

## 2023-10-24 NOTE — Telephone Encounter (Signed)
 See OV note.  Thanks.

## 2023-10-24 NOTE — Progress Notes (Unsigned)
 He was playing golf, was chipping at the practice green.  Then walked to the driving range.  Hit some wedges and then some irons.  Then he felt a pop in the R hamstring.  Pain at the time.  Walked to the car and then drove home.  Iced in the meantime.  Taking tylenol  for pain.  No L leg pain.  No anterior R leg pain.    R hamstring pain is better then prior but not resolved.  No bruising.  No fevers.  Normal sensation R leg.  No back pain.    Meds, vitals, and allergies reviewed.   ROS: Per HPI unless specifically indicated in ROS section   No pain at rest but R proximal medial hamstring sore with extension of the R leg.  S/S grossly wnl No mass or bruising.

## 2023-10-24 NOTE — Telephone Encounter (Signed)
 Copied from CRM [id number]. Topic: Clinical - Red Word Triage >> Oct 24, 2023  8:03 AM Juluis Ok wrote: [facility] that prompted transfer to Nurse Triage: Rt hamstring pain, possible tear/pull  Chief Complaint: right hamstring pain; states pulled muscle golfing on Friday Symptoms: pain Frequency: constant Pertinent Negatives: Patient denies fever, cp, sob, numbness Disposition: [] ED /[] Urgent Care (no appt availability in office) / [x] Appointment(In office/virtual)/ []  [facility]/ [] Home Care/ [] Refused Recommended Disposition /[] [place] [facility]/ []  Follow-up with PCP Additional Notes: per protocol apt made for today; care advice given, denies questions; instructed to go to ER if becomes worse.   Reason for Disposition  [1] MODERATE pain (e.g., interferes with normal activities, limping) AND [2] present > 3 days  Answer Assessment - Initial Assessment Questions 1. ONSET: "When did the pain start?"      friday 2. LOCATION: "Where is the pain located?"      Right hamstring pain 3. PAIN: "How bad is the pain?"    (Scale 1-10; or mild, moderate, severe)   -  MILD (1-3): doesn't interfere with normal activities    -  MODERATE (4-7): interferes with normal activities (e.g., work or school) or awakens from sleep, limping    -  SEVERE (8-10): excruciating pain, unable to do any normal activities, unable to walk     5-6/10 4. WORK OR EXERCISE: "Has there been any recent work or exercise that involved this part of the body?"      Was playing golf 5. CAUSE: "What do you think is causing the leg pain?"     Pulled hamstring 6. OTHER SYMPTOMS: "Do you have any other symptoms?" (e.g., chest pain, back pain, breathing difficulty, swelling, rash, fever, numbness, weakness)     denies 7. PREGNANCY: "Is there any chance you are pregnant?" "When was your last menstrual period?"     na  Protocols used: Leg Pain-A-AH

## 2023-10-24 NOTE — Patient Instructions (Addendum)
 Keep icing for 5 minutes at a time.  Try a compression wrap on the thigh.  Refer to PT.  I would limit any activity that causes pain.  Gently stretch prior to getting out of bed.

## 2023-10-25 DIAGNOSIS — H905 Unspecified sensorineural hearing loss: Secondary | ICD-10-CM | POA: Diagnosis not present

## 2023-10-26 DIAGNOSIS — S76311A Strain of muscle, fascia and tendon of the posterior muscle group at thigh level, right thigh, initial encounter: Secondary | ICD-10-CM | POA: Insufficient documentation

## 2023-10-26 NOTE — Assessment & Plan Note (Signed)
 Likely hamstring strain.  Discussed options.  No sign of full rupture.  Okay for outpatient follow-up.  Keep icing for 5 minutes at a time.  Try a compression wrap on the thigh.  Refer to PT.  I would limit any activity that causes pain.  Gently stretch prior to getting out of bed.

## 2023-10-31 NOTE — Therapy (Incomplete)
 OUTPATIENT PHYSICAL THERAPY LOWER EXTREMITY EVALUATION   Patient Name: Cory Parker MRN: [id number] DOB:[birth year], 83 y.o., male Today's Date: 10/31/2023  END OF SESSION:   Past Medical History:  Diagnosis Date   History of bronchitis    Varicose veins    Past Surgical History:  Procedure Laterality Date   APPENDECTOMY     CYSTOSCOPY N/A 04/22/2020   Procedure: CYSTOSCOPY;  Surgeon: Sherlyn Ditto, MD;  Location: [facility];  Service: Urology;  Laterality: N/A;   finger torn tendon laceration     TONSILLECTOMY     TRANSURETHRAL RESECTION OF PROSTATE N/A 04/22/2020   Procedure: TRANSURETHRAL RESECTION OF THE PROSTATE (TURP);  Surgeon: Sherlyn Ditto, MD;  Location: [facility];  Service: Urology;  Laterality: N/A;  1 HR   Patient Active Problem List   Diagnosis Date Noted   Strain of right hamstring 10/26/2023   Acute cough 06/24/2023   PND (post-nasal drip) 06/24/2023   Body aches 06/24/2023   COVID-19 06/24/2023   Acute pain of right knee 09/24/2022   Abnormal urinalysis 08/11/2022   Urinary frequency 08/11/2022   Gross hematuria 08/11/2022   Acute prostatitis 08/11/2022   Right calf pain 01/13/2022   Tick bite of left thigh 01/13/2022   Strain of right calf muscle 10/26/2021   Family history of heart disease 08/07/2019   Erythema of lower extremity 07/30/2019   Elevated PSA 05/09/2018   Left leg pain 06/17/2017   BPH (benign prostatic hyperplasia) 04/29/2017   Encounter for annual general medical examination with abnormal findings in adult 04/12/2016   Varicose veins of bilateral lower extremities with other complications 07/31/2013   Venous (peripheral) insufficiency 08/30/2008    PCP: Gabriel John NP  REFERRING PROVIDER: Richrd Char MD   REFERRING DIAG: R hamstring strain  THERAPY DIAG:  No diagnosis found.  Rationale for Evaluation and Treatment: {HABREHAB:27488}  ONSET DATE: ***  SUBJECTIVE:   SUBJECTIVE STATEMENT: ***  PERTINENT  HISTORY: Patient was playing golf and felt a pop in the R hamstring with pain at the time. Walked to car and drove home. PMH includes vericose veins,  PAIN:  Are you having pain? {OPRCPAIN:27236}  PRECAUTIONS: {Therapy precautions:24002}  RED FLAGS: {PT Red Flags:29287}   WEIGHT BEARING RESTRICTIONS: {Yes ***/No:24003}  FALLS:  Has patient fallen in last 6 months? {fallsyesno:27318}  LIVING ENVIRONMENT: Lives with: {[facility] lives with:25569::"lives with their family"} Lives in: {Lives in:25570} Stairs: {opstairs:27293} Has following equipment at home: {Assistive devices:23999}  OCCUPATION: ***  PLOF: {PLOF:24004}  PATIENT GOALS: ***  NEXT MD VISIT: ***  OBJECTIVE:  Note: Objective measures were completed at Evaluation unless otherwise noted.  DIAGNOSTIC FINDINGS: ***  PATIENT SURVEYS:  {rehab surveys:24030}  COGNITION: Overall cognitive status: {cognition:24006}     SENSATION: {sensation:27233}  EDEMA:  {edema:24020}  MUSCLE LENGTH: Hamstrings: Right *** deg; Left *** deg Andy Bannister test: Right *** deg; Left *** deg  POSTURE: {posture:25561}  PALPATION: ***  LOWER EXTREMITY ROM:  {AROM/PROM:27142} ROM Right eval Left eval  Hip flexion    Hip extension    Hip abduction    Hip adduction    Hip internal rotation    Hip external rotation    Knee flexion    Knee extension    Ankle dorsiflexion    Ankle plantarflexion    Ankle inversion    Ankle eversion     (Blank rows = not tested)  LOWER EXTREMITY MMT:  MMT Right eval Left eval  Hip flexion    Hip extension  Hip abduction    Hip adduction    Hip internal rotation    Hip external rotation    Knee flexion    Knee extension    Ankle dorsiflexion    Ankle plantarflexion    Ankle inversion    Ankle eversion     (Blank rows = not tested)  LOWER EXTREMITY SPECIAL TESTS:  {LEspecialtests:26242}  FUNCTIONAL TESTS:  {Functional tests:24029}  GAIT: Distance walked: *** Assistive device  utilized: {Assistive devices:23999} Level of assistance: {Levels of assistance:24026} Comments: ***                                                                                                                                TREATMENT DATE: ***    PATIENT EDUCATION:  Education details: *** Person educated: {Person educated:25204} Education method: {Education Method:25205} Education comprehension: {Education Comprehension:25206}  HOME EXERCISE PROGRAM: ***  ASSESSMENT:  CLINICAL IMPRESSION: Patient is a *** y.o. *** who was seen today for physical therapy evaluation and treatment for ***.   OBJECTIVE IMPAIRMENTS: {opptimpairments:25111}.   ACTIVITY LIMITATIONS: {activitylimitations:27494}  PARTICIPATION LIMITATIONS: {participationrestrictions:25113}  PERSONAL FACTORS: {Personal factors:25162} are also affecting patient's functional outcome.   REHAB POTENTIAL: {rehabpotential:25112}  CLINICAL DECISION MAKING: {clinical decision making:25114}  EVALUATION COMPLEXITY: {Evaluation complexity:25115}   GOALS: Goals reviewed with patient? {yes/no:20286}  SHORT TERM GOALS: Target date: *** *** Baseline: Goal status: INITIAL  2.  *** Baseline:  Goal status: INITIAL  3.  *** Baseline:  Goal status: INITIAL  4.  *** Baseline:  Goal status: INITIAL  5.  *** Baseline:  Goal status: INITIAL  6.  *** Baseline:  Goal status: INITIAL  LONG TERM GOALS: Target date: ***  *** Baseline:  Goal status: INITIAL  2.  *** Baseline:  Goal status: INITIAL  3.  *** Baseline:  Goal status: INITIAL  4.  *** Baseline:  Goal status: INITIAL  5.  *** Baseline:  Goal status: INITIAL  6.  *** Baseline:  Goal status: INITIAL   PLAN:  PT FREQUENCY: {rehab frequency:25116}  PT DURATION: {rehab duration:25117}  PLANNED INTERVENTIONS: {rehab planned interventions:25118::"97110-Therapeutic exercises","97530- Therapeutic [phone]- Neuromuscular  re-education","97535- Self JXBJ","47829- Manual therapy"}  PLAN FOR NEXT SESSION: ***   Gladies Sofranko, PT 10/31/2023, 5:12 PM

## 2023-11-01 ENCOUNTER — Ambulatory Visit: Attending: Family Medicine

## 2023-11-01 DIAGNOSIS — M79604 Pain in right leg: Secondary | ICD-10-CM | POA: Insufficient documentation

## 2023-11-01 DIAGNOSIS — M6281 Muscle weakness (generalized): Secondary | ICD-10-CM | POA: Insufficient documentation

## 2023-11-01 DIAGNOSIS — R262 Difficulty in walking, not elsewhere classified: Secondary | ICD-10-CM | POA: Insufficient documentation

## 2023-11-01 DIAGNOSIS — S76311A Strain of muscle, fascia and tendon of the posterior muscle group at thigh level, right thigh, initial encounter: Secondary | ICD-10-CM | POA: Insufficient documentation

## 2023-11-01 NOTE — Therapy (Signed)
 OUTPATIENT PHYSICAL THERAPY LOWER EXTREMITY EVALUATION   Patient Name: Cory Parker MRN: [id number] DOB:02-25-1941, 83 y.o., male Today's Date: 11/01/2023  END OF SESSION:  PT End of Session - 11/01/23 0930     Visit Number 1    Number of Visits 17    Date for PT Re-Evaluation 12/27/23    PT Start Time 0846    PT Stop Time 0930    PT Time Calculation (min) 44 min             Past Medical History:  Diagnosis Date   History of bronchitis    Varicose veins    Past Surgical History:  Procedure Laterality Date   APPENDECTOMY     CYSTOSCOPY N/A 04/22/2020   Procedure: CYSTOSCOPY;  Surgeon: Sherlyn Ditto, MD;  Location: [facility];  Service: Urology;  Laterality: N/A;   finger torn tendon laceration     TONSILLECTOMY     TRANSURETHRAL RESECTION OF PROSTATE N/A 04/22/2020   Procedure: TRANSURETHRAL RESECTION OF THE PROSTATE (TURP);  Surgeon: Sherlyn Ditto, MD;  Location: [facility];  Service: Urology;  Laterality: N/A;  1 HR   Patient Active Problem List   Diagnosis Date Noted   Strain of right hamstring 10/26/2023   Acute cough 06/24/2023   PND (post-nasal drip) 06/24/2023   Body aches 06/24/2023   COVID-19 06/24/2023   Acute pain of right knee 09/24/2022   Abnormal urinalysis 08/11/2022   Urinary frequency 08/11/2022   Gross hematuria 08/11/2022   Acute prostatitis 08/11/2022   Right calf pain 01/13/2022   Tick bite of left thigh 01/13/2022   Strain of right calf muscle 10/26/2021   Family history of heart disease 08/07/2019   Erythema of lower extremity 07/30/2019   Elevated PSA 05/09/2018   Left leg pain 06/17/2017   BPH (benign prostatic hyperplasia) 04/29/2017   Encounter for annual general medical examination with abnormal findings in adult 04/12/2016   Varicose veins of bilateral lower extremities with other complications 07/31/2013   Venous (peripheral) insufficiency 08/30/2008    PCP: Tretha Fu NP  REFERRING PROVIDER: Richrd Char  MD  REFERRING DIAG: [phone] (ICD-10-CM) - Strain of right hamstring, initial encounter  THERAPY DIAG:  Pain in right leg  Muscle weakness (generalized)  Difficulty in walking, not elsewhere classified  Rationale for Evaluation and Treatment: Rehabilitation  ONSET DATE: 10/21/23  SUBJECTIVE:   SUBJECTIVE STATEMENT: Pt is a pleasant 83 y.o. male referred to [facility] for R hamstring strain.   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 Initially started chipping to begin with then moved to the driving range working his way up to his irons. Felt a pop in his fore swing. Reports pain proximally near his ischial tuberosity and pain radiating down his hamstring. Reports difficulty walking and icing it. Initially very sharp pain. Has had some relief with icing it and can move a little better. Has no specific areas where he has pain, but reports in general with transfers and gait.  Pain relieved with ice and Tylenol . No bruising, no swelling reported. Has grabbed a compression sleeve/brace for his thigh. Denies N/T or other radicular symptoms.   PAIN:  Are you having pain? Yes: NPRS scale: 5-6/10 NPS Pain location: R ischial tuberosity and hamstring down to medial/lateral knee Pain description: dull achey. Sharp initially  Aggravating factors: STS and gait Relieving factors: Tylenol , Ice  PRECAUTIONS: None  RED FLAGS: None   WEIGHT BEARING RESTRICTIONS: No  FALLS:  Has patient fallen in last 6 months? No  LIVING ENVIRONMENT: Lives with: lives with their spouse   OCCUPATION: Warehouse work?  PLOF: Independent  PATIENT GOALS: Pain relief and return to pain free golf  NEXT MD VISIT: N/A  OBJECTIVE:  Note: Objective measures were completed at Evaluation unless otherwise noted.  DIAGNOSTIC FINDINGS: N/A  PATIENT SURVEYS:  LEFS 60/80  COGNITION: Overall cognitive status: Within functional limits for tasks  assessed     SENSATION: WFL   MUSCLE LENGTH: Hamstrings SLR: Right 75 deg; Left 72 deg Hamstrings 90/90: Right: 20* deg; Left: 31* deg  POSTURE: No Significant postural limitations  PALPATION: TTP at R ischial tuberosity along hamstring tendon origin.   LOWER EXTREMITY ROM:  Active ROM Right eval Left eval  Hip flexion    Hip extension 5* 12  Hip abduction    Hip adduction    Hip internal rotation    Hip external rotation    Knee flexion    Knee extension    Ankle dorsiflexion    Ankle plantarflexion    Ankle inversion    Ankle eversion     (Blank rows = not tested)  LOWER EXTREMITY MMT:  MMT Right eval Left eval  Hip flexion 5 5  Hip extension 4* 5  Hip abduction    Hip adduction    Hip internal rotation 5 5  Hip external rotation 5 5  Knee flexion 4* 5  Knee extension 5 5  Ankle dorsiflexion 5 5  Ankle plantarflexion    Ankle inversion    Ankle eversion     (Blank rows = not tested)  LOWER EXTREMITY SPECIAL TESTS:  Hip special tests: Portia Brittle (FABER) test: negative and Trendelenburg test: positive   Hip FADDIR: Negative   FUNCTIONAL TESTS:  5 times sit to stand: 11.36 seconds  10 meter gait speed: .86 m/s GAIT: Distance walked: 10 meters Assistive device utilized: None Level of assistance: Complete Independence Comments: R/L trendelenburg gait. Antalgic on RLE.                                                                                                                                 TREATMENT DATE: 11/01/23   Eval only  PATIENT EDUCATION:  Education details: HEP, POC Person educated: Patient Education method: Explanation, Demonstration, and Handouts Education comprehension: verbalized understanding and needs further education  HOME EXERCISE PROGRAM: Access Code: [email or web address] URL: [email or web address] Date: 11/01/2023 Prepared by: Lyda Samples  Exercises - Supine Bridge  - 1 x daily - 7 x weekly - 3 sets - 6 reps -  Sidelying Hip Abduction  - 1 x daily - 7 x weekly - 3 sets - 8 reps - Supine Lower Trunk Rotation  - 1 x daily - 7 x weekly - 2 sets - 12 reps - Seated Hamstring Stretch  - 1 x daily - 7 x weekly - 1 sets - 2 reps - 30 hold  ASSESSMENT:  CLINICAL IMPRESSION: Patient is a 83 y.o. male who was seen today for physical therapy evaluation and treatment for R hamstring strain. Pt does present with diminished R hamstring mobility with SLR and 90/90 positions with concordant pain. Pt also with expected knee flexion and hip extension weakness leading to gait abnormality and slowed gait speed for current activity level. These deficits are increasing pt's falls risk and limiting participation with work related walking tasks and recreational sport with playing golf. Pt will benefit from skilled PT services to address these deficits and maximize return to PLOF.    OBJECTIVE IMPAIRMENTS: Abnormal gait, decreased mobility, difficulty walking, decreased ROM, decreased strength, impaired flexibility, and pain.   ACTIVITY LIMITATIONS: standing, squatting, and locomotion level  PARTICIPATION LIMITATIONS: community activity and occupation  PERSONAL FACTORS: Age, Fitness, Past/current experiences, Profession, and Time since onset of injury/illness/exacerbation are also affecting patient's functional outcome.   REHAB POTENTIAL: Good  CLINICAL DECISION MAKING: Stable/uncomplicated  EVALUATION COMPLEXITY: Low   GOALS: Goals reviewed with patient? No  SHORT TERM GOALS: Target date: 11/29/23 Pt will be independent with HEP to improve RLE strength and flexibility to return to PLOF.  Baseline: 11/01/23: Provided HEP  Goal status: INITIAL   LONG TERM GOALS: Target date: 12/27/23  Pt will improve LEFS by at least 9 points to demonstrate clinically significant improvement with ADL's.  Baseline: 60/80 Goal status: INITIAL  2.  Pt will improve pain free R hamstring flexibility equal to LLE to demonstrate return  to symmetric mobility for gait and golfing tasks.  Baseline: 11/01/23: Hamstrings SLR: Right 75 deg; Left 72 deg Hamstrings 90/90: Right: 20* deg; Left: 31* deg  Goal status: INITIAL  3.  Pt will report worst pain as a 2/10 NPS or less to demonstrate clinically significant reduction in pain with ADL's. Baseline: 11/01/23: 5-6/10 NPS Goal status: INITIAL    PLAN:  PT FREQUENCY: 1-2x/week  PT DURATION: 8 weeks  PLANNED INTERVENTIONS: 97164- PT Re-evaluation, 97110-Therapeutic exercises, 97530- Therapeutic activity, 97112- Neuromuscular re-education, 97535- Self Care, 91478- Manual therapy, [phone]- Gait training, Patient/Family education, Balance training, Stair training, Dry Needling, Joint mobilization, Joint manipulation, Cryotherapy, and Moist heat  PLAN FOR NEXT SESSION: review HEP, proximal hip strength, gentle R hamstring strength/mobility    Marc Senior. Fairly IV, PT, DPT Physical Therapist-   [facility]  11/01/2023, 12:42 PM

## 2023-11-02 NOTE — Therapy (Signed)
 OUTPATIENT PHYSICAL THERAPY LOWER EXTREMITY TREATMENT   Patient Name: Cory Parker MRN: [id number] DOB:1940-10-05, 83 y.o., male Today's Date: 11/03/2023  END OF SESSION:  PT End of Session - 11/03/23 0831     Visit Number 2    Number of Visits 17    Date for PT Re-Evaluation 12/27/23    PT Start Time 0837    PT Stop Time 0920    PT Time Calculation (min) 43 min    Equipment Utilized During Treatment Gait belt    Activity Tolerance Patient tolerated treatment well    Behavior During Therapy WFL for tasks assessed/performed              Past Medical History:  Diagnosis Date   History of bronchitis    Varicose veins    Past Surgical History:  Procedure Laterality Date   APPENDECTOMY     CYSTOSCOPY N/A 04/22/2020   Procedure: CYSTOSCOPY;  Surgeon: Sherlyn Ditto, MD;  Location: [facility];  Service: Urology;  Laterality: N/A;   finger torn tendon laceration     TONSILLECTOMY     TRANSURETHRAL RESECTION OF PROSTATE N/A 04/22/2020   Procedure: TRANSURETHRAL RESECTION OF THE PROSTATE (TURP);  Surgeon: Sherlyn Ditto, MD;  Location: [facility];  Service: Urology;  Laterality: N/A;  1 HR   Patient Active Problem List   Diagnosis Date Noted   Strain of right hamstring 10/26/2023   Acute cough 06/24/2023   PND (post-nasal drip) 06/24/2023   Body aches 06/24/2023   COVID-19 06/24/2023   Acute pain of right knee 09/24/2022   Abnormal urinalysis 08/11/2022   Urinary frequency 08/11/2022   Gross hematuria 08/11/2022   Acute prostatitis 08/11/2022   Right calf pain 01/13/2022   Tick bite of left thigh 01/13/2022   Strain of right calf muscle 10/26/2021   Family history of heart disease 08/07/2019   Erythema of lower extremity 07/30/2019   Elevated PSA 05/09/2018   Left leg pain 06/17/2017   BPH (benign prostatic hyperplasia) 04/29/2017   Encounter for annual general medical examination with abnormal findings in adult 04/12/2016   Varicose veins of bilateral lower  extremities with other complications 07/31/2013   Venous (peripheral) insufficiency 08/30/2008    PCP: Tretha Fu NP  REFERRING PROVIDER: Richrd Char MD  REFERRING DIAG: [phone] (ICD-10-CM) - Strain of right hamstring, initial encounter  THERAPY DIAG:  Pain in right leg  Muscle weakness (generalized)  Difficulty in walking, not elsewhere classified  Pain in right lower leg  Rationale for Evaluation and Treatment: Rehabilitation  ONSET DATE: 10/21/23  SUBJECTIVE:   SUBJECTIVE STATEMENT: Patient reports feeling okay today. Reports compliant with HEP- states able to go up steps reciprocal for the first time since injury today.   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 Initially started chipping to begin with then moved to the driving range working his way up to his irons. Felt a pop in his fore swing. Reports pain proximally near his ischial tuberosity and pain radiating down his hamstring. Reports difficulty walking and icing it. Initially very sharp pain. Has had some relief with icing it and can move a little better. Has no specific areas where he has pain, but reports in general with transfers and gait.  Pain relieved with ice and Tylenol . No bruising, no swelling reported. Has grabbed a compression sleeve/brace for his thigh. Denies N/T or other radicular symptoms.   PAIN:  Are you having pain? Yes: NPRS scale: 5-6/10  NPS Pain location: R ischial tuberosity and hamstring down to medial/lateral knee Pain description: dull achey. Sharp initially  Aggravating factors: STS and gait Relieving factors: Tylenol , Ice  PRECAUTIONS: None  RED FLAGS: None   WEIGHT BEARING RESTRICTIONS: No  FALLS:  Has patient fallen in last 6 months? No  LIVING ENVIRONMENT: Lives with: lives with their spouse   OCCUPATION: Warehouse work?  PLOF: Independent  PATIENT GOALS: Pain relief and return to pain free golf  NEXT MD VISIT:  N/A  OBJECTIVE:  Note: Objective measures were completed at Evaluation unless otherwise noted.  DIAGNOSTIC FINDINGS: N/A  PATIENT SURVEYS:  LEFS 60/80  COGNITION: Overall cognitive status: Within functional limits for tasks assessed     SENSATION: WFL   MUSCLE LENGTH: Hamstrings SLR: Right 75 deg; Left 72 deg Hamstrings 90/90: Right: 20* deg; Left: 31* deg  POSTURE: No Significant postural limitations  PALPATION: TTP at R ischial tuberosity along hamstring tendon origin.   LOWER EXTREMITY ROM:  Active ROM Right eval Left eval  Hip flexion    Hip extension 5* 12  Hip abduction    Hip adduction    Hip internal rotation    Hip external rotation    Knee flexion    Knee extension    Ankle dorsiflexion    Ankle plantarflexion    Ankle inversion    Ankle eversion     (Blank rows = not tested)  LOWER EXTREMITY MMT:  MMT Right eval Left eval  Hip flexion 5 5  Hip extension 4* 5  Hip abduction    Hip adduction    Hip internal rotation 5 5  Hip external rotation 5 5  Knee flexion 4* 5  Knee extension 5 5  Ankle dorsiflexion 5 5  Ankle plantarflexion    Ankle inversion    Ankle eversion     (Blank rows = not tested)  LOWER EXTREMITY SPECIAL TESTS:  Hip special tests: Portia Brittle (FABER) test: negative and Trendelenburg test: positive   Hip FADDIR: Negative   FUNCTIONAL TESTS:  5 times sit to stand: 11.36 seconds  10 meter gait speed: .86 m/s GAIT: Distance walked: 10 meters Assistive device utilized: None Level of assistance: Complete Independence Comments: R/L trendelenburg gait. Antalgic on RLE.                                                                                                                                 TREATMENT DATE: 11/03/23   THEREX:   Ham set (hold 5 sec) x 10 RLE Bridge (hold 2 sec) 2 x 10 reps Supine HS stretch- hold 30 sec  x 4 RLE Supine Hip abd/add 2 x 10 reps RLE Prone HS curl RLE 2 x 10 reps  Standing Hip ext  (straight LE) RLE 2 x 10 reps  Standing calf raises BLE 2 x 10 reps   Self care/Home management:  Reviewed use of ice, compression and elevation as needed.  Discussed previously provided HEP and added above exercises to HEP today.  Reviewed being careful with mobility to avoid re-straining hamstring.   PATIENT EDUCATION:  Education details: HEP, POC Person educated: Patient Education method: Explanation, Demonstration, and Handouts Education comprehension: verbalized understanding and needs further education  HOME EXERCISE PROGRAM:  Access Code: [email or web address] URL: [email or web address] Date: 11/03/2023 Prepared by: Ferrell Hu  Exercises - Supine Bridge  - 1 x daily - 7 x weekly - 3 sets - 6 reps - Sidelying Hip Abduction  - 1 x daily - 7 x weekly - 3 sets - 8 reps - Supine Lower Trunk Rotation  - 1 x daily - 7 x weekly - 2 sets - 12 reps - Seated Hamstring Stretch  - 1 x daily - 7 x weekly - 1 sets - 2 reps - 30 hold - Supine Isometric Hamstring Set  - 3 x weekly - 3 sets - 10 reps - Prone Knee Flexion  - 3 x weekly - 3 sets - 10 reps - Standing Hip Extension with Counter Support  - 3 x weekly - 3 sets - 10 reps - Standing Heel Raises  - 1 x daily - 3 x weekly - 3 sets - 10 reps        Access Code: [email or web address] URL: [email or web address] Date: 11/01/2023 Prepared by: Lyda Samples  Exercises - Supine Bridge  - 1 x daily - 7 x weekly - 3 sets - 6 reps - Sidelying Hip Abduction  - 1 x daily - 7 x weekly - 3 sets - 8 reps - Supine Lower Trunk Rotation  - 1 x daily - 7 x weekly - 2 sets - 12 reps - Seated Hamstring Stretch  - 1 x daily - 7 x weekly - 1 sets - 2 reps - 30 hold  ASSESSMENT:  CLINICAL IMPRESSION: Patient is a 83 y.o. male who was seen today for physical therapy treatment for R hamstring strain. Treatment consisted of reassessing pain of which patient denied today then progress with gentle HS stretching then able to progress RLE  Strengthening well today without complications. He is responsive to all instruction and cues for slow safe movement and to avoid any pain. He was issued an addition to HEP today and will need to reassess his ability to complete next visit.Pt will benefit from skilled PT services to address these deficits and maximize return to PLOF.    OBJECTIVE IMPAIRMENTS: Abnormal gait, decreased mobility, difficulty walking, decreased ROM, decreased strength, impaired flexibility, and pain.   ACTIVITY LIMITATIONS: standing, squatting, and locomotion level  PARTICIPATION LIMITATIONS: community activity and occupation  PERSONAL FACTORS: Age, Fitness, Past/current experiences, Profession, and Time since onset of injury/illness/exacerbation are also affecting patient's functional outcome.   REHAB POTENTIAL: Good  CLINICAL DECISION MAKING: Stable/uncomplicated  EVALUATION COMPLEXITY: Low   GOALS: Goals reviewed with patient? No  SHORT TERM GOALS: Target date: 11/29/23 Pt will be independent with HEP to improve RLE strength and flexibility to return to PLOF.  Baseline: 11/01/23: Provided HEP  Goal status: INITIAL   LONG TERM GOALS: Target date: 12/27/23  Pt will improve LEFS by at least 9 points to demonstrate clinically significant improvement with ADL's.  Baseline: 60/80 Goal status: INITIAL  2.  Pt will improve pain free R hamstring flexibility equal to LLE to demonstrate return to symmetric mobility for gait and golfing tasks.  Baseline: 11/01/23: Hamstrings SLR: Right 75 deg; Left 72 deg Hamstrings 90/90: Right: 20* deg; Left: 31* deg  Goal status: INITIAL  3.  Pt will report worst pain as a 2/10 NPS or less to demonstrate clinically significant reduction in pain with ADL's. Baseline: 11/01/23: 5-6/10 NPS Goal status: INITIAL    PLAN:  PT FREQUENCY: 1-2x/week  PT DURATION: 8 weeks  PLANNED INTERVENTIONS: 97164- PT Re-evaluation, 97110-Therapeutic exercises, 97530- Therapeutic  activity, 97112- Neuromuscular re-education, 97535- Self Care, [id number]- Manual therapy, [phone]- Gait training, Patient/Family education, Balance training, Stair training, Dry Needling, Joint mobilization, Joint manipulation, Cryotherapy, and Moist heat  PLAN FOR NEXT SESSION: review HEP, proximal hip strength, gentle R hamstring strength/mobility    Ossie Blend, PT Physical Therapist- [facility]  [facility]  11/03/2023, 9:34 AM

## 2023-11-02 NOTE — Progress Notes (Signed)
 Agree.  Thanks.  Gwynda Leriche Vallarie Gauze, MD 11/02/23  11:51 AM

## 2023-11-03 ENCOUNTER — Ambulatory Visit: Attending: Family Medicine

## 2023-11-03 DIAGNOSIS — R262 Difficulty in walking, not elsewhere classified: Secondary | ICD-10-CM | POA: Insufficient documentation

## 2023-11-03 DIAGNOSIS — M79661 Pain in right lower leg: Secondary | ICD-10-CM | POA: Insufficient documentation

## 2023-11-03 DIAGNOSIS — M6281 Muscle weakness (generalized): Secondary | ICD-10-CM | POA: Diagnosis not present

## 2023-11-03 DIAGNOSIS — M79604 Pain in right leg: Secondary | ICD-10-CM | POA: Diagnosis not present

## 2023-11-08 ENCOUNTER — Ambulatory Visit

## 2023-11-08 DIAGNOSIS — R262 Difficulty in walking, not elsewhere classified: Secondary | ICD-10-CM | POA: Diagnosis not present

## 2023-11-08 DIAGNOSIS — M79604 Pain in right leg: Secondary | ICD-10-CM

## 2023-11-08 DIAGNOSIS — M6281 Muscle weakness (generalized): Secondary | ICD-10-CM | POA: Diagnosis not present

## 2023-11-08 DIAGNOSIS — M79661 Pain in right lower leg: Secondary | ICD-10-CM

## 2023-11-08 NOTE — Therapy (Signed)
 OUTPATIENT PHYSICAL THERAPY LOWER EXTREMITY TREATMENT   Patient Name: Cory Parker MRN: [id number] DOB:[birth year], 83 y.o., male Today's Date: 11/08/2023  END OF SESSION:  PT End of Session - 11/08/23 0933     Visit Number 3    Number of Visits 17    Date for PT Re-Evaluation 12/27/23    PT Start Time 0933    PT Stop Time 1003    PT Time Calculation (min) 30 min    Equipment Utilized During Treatment Gait belt    Activity Tolerance Patient tolerated treatment well    Behavior During Therapy WFL for tasks assessed/performed              Past Medical History:  Diagnosis Date   History of bronchitis    Varicose veins    Past Surgical History:  Procedure Laterality Date   APPENDECTOMY     CYSTOSCOPY N/A 04/22/2020   Procedure: CYSTOSCOPY;  Surgeon: Sherlyn Ditto, MD;  Location: [facility];  Service: Urology;  Laterality: N/A;   finger torn tendon laceration     TONSILLECTOMY     TRANSURETHRAL RESECTION OF PROSTATE N/A 04/22/2020   Procedure: TRANSURETHRAL RESECTION OF THE PROSTATE (TURP);  Surgeon: Sherlyn Ditto, MD;  Location: [facility];  Service: Urology;  Laterality: N/A;  1 HR   Patient Active Problem List   Diagnosis Date Noted   Strain of right hamstring 10/26/2023   Acute cough 06/24/2023   PND (post-nasal drip) 06/24/2023   Body aches 06/24/2023   COVID-19 06/24/2023   Acute pain of right knee 09/24/2022   Abnormal urinalysis 08/11/2022   Urinary frequency 08/11/2022   Gross hematuria 08/11/2022   Acute prostatitis 08/11/2022   Right calf pain 01/13/2022   Tick bite of left thigh 01/13/2022   Strain of right calf muscle 10/26/2021   Family history of heart disease 08/07/2019   Erythema of lower extremity 07/30/2019   Elevated PSA 05/09/2018   Left leg pain 06/17/2017   BPH (benign prostatic hyperplasia) 04/29/2017   Encounter for annual general medical examination with abnormal findings in adult 04/12/2016   Varicose veins of bilateral lower  extremities with other complications 07/31/2013   Venous (peripheral) insufficiency 08/30/2008    PCP: Tretha Fu NP  REFERRING PROVIDER: Richrd Char MD  REFERRING DIAG: [phone] (ICD-10-CM) - Strain of right hamstring, initial encounter  THERAPY DIAG:  Pain in right leg  Muscle weakness (generalized)  Difficulty in walking, not elsewhere classified  Pain in right lower leg  Rationale for Evaluation and Treatment: Rehabilitation  ONSET DATE: 10/21/23  SUBJECTIVE:   SUBJECTIVE STATEMENT: Patient reports feeling okay today. Reports compliant with HEP- states able to go up steps reciprocal for the first time since injury today.   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 Initially started chipping to begin with then moved to the driving range working his way up to his irons. Felt a pop in his fore swing. Reports pain proximally near his ischial tuberosity and pain radiating down his hamstring. Reports difficulty walking and icing it. Initially very sharp pain. Has had some relief with icing it and can move a little better. Has no specific areas where he has pain, but reports in general with transfers and gait.  Pain relieved with ice and Tylenol . No bruising, no swelling reported. Has grabbed a compression sleeve/brace for his thigh. Denies N/T or other radicular symptoms.   PAIN:  Are you having pain? Yes: NPRS scale: 5-6/10  NPS Pain location: R ischial tuberosity and hamstring down to medial/lateral knee Pain description: dull achey. Sharp initially  Aggravating factors: STS and gait Relieving factors: Tylenol , Ice  PRECAUTIONS: None  RED FLAGS: None   WEIGHT BEARING RESTRICTIONS: No  FALLS:  Has patient fallen in last 6 months? No  LIVING ENVIRONMENT: Lives with: lives with their spouse   OCCUPATION: Warehouse work?  PLOF: Independent  PATIENT GOALS: Pain relief and return to pain free golf  NEXT MD VISIT:  N/A  OBJECTIVE:  Note: Objective measures were completed at Evaluation unless otherwise noted.  DIAGNOSTIC FINDINGS: N/A  PATIENT SURVEYS:  LEFS 60/80  COGNITION: Overall cognitive status: Within functional limits for tasks assessed     SENSATION: WFL   MUSCLE LENGTH: Hamstrings SLR: Right 75 deg; Left 72 deg Hamstrings 90/90: Right: 20* deg; Left: 31* deg  POSTURE: No Significant postural limitations  PALPATION: TTP at R ischial tuberosity along hamstring tendon origin.   LOWER EXTREMITY ROM:  Active ROM Right eval Left eval  Hip flexion    Hip extension 5* 12  Hip abduction    Hip adduction    Hip internal rotation    Hip external rotation    Knee flexion    Knee extension    Ankle dorsiflexion    Ankle plantarflexion    Ankle inversion    Ankle eversion     (Blank rows = not tested)  LOWER EXTREMITY MMT:  MMT Right eval Left eval  Hip flexion 5 5  Hip extension 4* 5  Hip abduction    Hip adduction    Hip internal rotation 5 5  Hip external rotation 5 5  Knee flexion 4* 5  Knee extension 5 5  Ankle dorsiflexion 5 5  Ankle plantarflexion    Ankle inversion    Ankle eversion     (Blank rows = not tested)  LOWER EXTREMITY SPECIAL TESTS:  Hip special tests: Portia Brittle (FABER) test: negative and Trendelenburg test: positive   Hip FADDIR: Negative   FUNCTIONAL TESTS:  5 times sit to stand: 11.36 seconds  10 meter gait speed: .86 m/s GAIT: Distance walked: 10 meters Assistive device utilized: None Level of assistance: Complete Independence Comments: R/L trendelenburg gait. Antalgic on RLE.                                                                                                                                 TREATMENT DATE: 11/03/23   THEREX:    Supine Passive HS stretch- hold 30 sec  x 4 RLE Ham set (hold 5 sec) x 10 RLE Bridge (hold 2 sec) 2 x 10 reps Supine Hip abd/add 2 x 10 reps RLE Prone HS curl RLE 2 x 10 reps  Standing Hip  ext (straight LE) RLE 2 x 10 reps  Standing calf raises BLE 2 x 10 reps   NMR:  SLS- RLE- hold up to 10 sec x 4 Anterior  trunk lean with SLS (in // bars- leaning forward to touch cone sitting on top of 6" block)  x 10 reps   .   PATIENT EDUCATION:  Education details: HEP, POC Person educated: Patient Education method: Explanation, Demonstration, and Handouts Education comprehension: verbalized understanding and needs further education  HOME EXERCISE PROGRAM:  Access Code: [email or web address] URL: [email or web address] Date: 11/03/2023 Prepared by: Ferrell Hu  Exercises - Supine Bridge  - 1 x daily - 7 x weekly - 3 sets - 6 reps - Sidelying Hip Abduction  - 1 x daily - 7 x weekly - 3 sets - 8 reps - Supine Lower Trunk Rotation  - 1 x daily - 7 x weekly - 2 sets - 12 reps - Seated Hamstring Stretch  - 1 x daily - 7 x weekly - 1 sets - 2 reps - 30 hold - Supine Isometric Hamstring Set  - 3 x weekly - 3 sets - 10 reps - Prone Knee Flexion  - 3 x weekly - 3 sets - 10 reps - Standing Hip Extension with Counter Support  - 3 x weekly - 3 sets - 10 reps - Standing Heel Raises  - 1 x daily - 3 x weekly - 3 sets - 10 reps        Access Code: [email or web address] URL: [email or web address] Date: 11/01/2023 Prepared by: Lyda Samples  Exercises - Supine Bridge  - 1 x daily - 7 x weekly - 3 sets - 6 reps - Sidelying Hip Abduction  - 1 x daily - 7 x weekly - 3 sets - 8 reps - Supine Lower Trunk Rotation  - 1 x daily - 7 x weekly - 2 sets - 12 reps - Seated Hamstring Stretch  - 1 x daily - 7 x weekly - 1 sets - 2 reps - 30 hold  ASSESSMENT:  CLINICAL IMPRESSION: Patient is a 83 y.o. male who was seen today for physical therapy treatment for R hamstring strain. He reports consistent with HEP and good verbalization of all exercises. He continues to be pain free so able to progress some functional activities such as SLS and reaching today without difficulty. Pt will  benefit from skilled PT services to address these deficits and maximize return to PLOF.    OBJECTIVE IMPAIRMENTS: Abnormal gait, decreased mobility, difficulty walking, decreased ROM, decreased strength, impaired flexibility, and pain.   ACTIVITY LIMITATIONS: standing, squatting, and locomotion level  PARTICIPATION LIMITATIONS: community activity and occupation  PERSONAL FACTORS: Age, Fitness, Past/current experiences, Profession, and Time since onset of injury/illness/exacerbation are also affecting patient's functional outcome.   REHAB POTENTIAL: Good  CLINICAL DECISION MAKING: Stable/uncomplicated  EVALUATION COMPLEXITY: Low   GOALS: Goals reviewed with patient? No  SHORT TERM GOALS: Target date: 11/29/23 Pt will be independent with HEP to improve RLE strength and flexibility to return to PLOF.  Baseline: 11/01/23: Provided HEP  Goal status: INITIAL   LONG TERM GOALS: Target date: 12/27/23  Pt will improve LEFS by at least 9 points to demonstrate clinically significant improvement with ADL's.  Baseline: 60/80 Goal status: INITIAL  2.  Pt will improve pain free R hamstring flexibility equal to LLE to demonstrate return to symmetric mobility for gait and golfing tasks.  Baseline: 11/01/23: Hamstrings SLR: Right 75 deg; Left 72 deg Hamstrings 90/90: Right: 20* deg; Left: 31* deg  Goal status: INITIAL  3.  Pt will report worst pain as a 2/10 NPS or less to demonstrate clinically significant reduction in pain with ADL's. Baseline: 11/01/23:  5-6/10 NPS Goal status: INITIAL    PLAN:  PT FREQUENCY: 1-2x/week  PT DURATION: 8 weeks  PLANNED INTERVENTIONS: 97164- PT Re-evaluation, 97110-Therapeutic exercises, 97530- Therapeutic activity, 97112- Neuromuscular re-education, 97535- Self Care, [id number]- Manual therapy, [phone]- Gait training, Patient/Family education, Balance training, Stair training, Dry Needling, Joint mobilization, Joint manipulation, Cryotherapy, and Moist  heat  PLAN FOR NEXT SESSION: review HEP, proximal hip strength, gentle R hamstring strength/mobility    Ossie Blend, PT Physical Therapist- [facility]  [facility]  11/08/2023, 10:17 AM

## 2023-11-10 ENCOUNTER — Ambulatory Visit

## 2023-11-15 ENCOUNTER — Ambulatory Visit

## 2023-11-15 DIAGNOSIS — M79604 Pain in right leg: Secondary | ICD-10-CM

## 2023-11-15 DIAGNOSIS — R262 Difficulty in walking, not elsewhere classified: Secondary | ICD-10-CM

## 2023-11-15 DIAGNOSIS — M79661 Pain in right lower leg: Secondary | ICD-10-CM | POA: Diagnosis not present

## 2023-11-15 DIAGNOSIS — M6281 Muscle weakness (generalized): Secondary | ICD-10-CM | POA: Diagnosis not present

## 2023-11-15 NOTE — Therapy (Signed)
 OUTPATIENT PHYSICAL THERAPY LOWER EXTREMITY TREATMENT/PT DISCHARGE SUMMARY   Patient Name: Cory Parker MRN: [id number] DOB:Jun 08, 1941, 83 y.o., male Today's Date: 11/15/2023  END OF SESSION:  PT End of Session - 11/15/23 0850     Visit Number 4    Number of Visits 17    Date for PT Re-Evaluation 12/27/23    PT Start Time 0847    PT Stop Time 0915    PT Time Calculation (min) 28 min    Equipment Utilized During Treatment Gait belt    Activity Tolerance Patient tolerated treatment well    Behavior During Therapy WFL for tasks assessed/performed              Past Medical History:  Diagnosis Date   History of bronchitis    Varicose veins    Past Surgical History:  Procedure Laterality Date   APPENDECTOMY     CYSTOSCOPY N/A 04/22/2020   Procedure: CYSTOSCOPY;  Surgeon: Sherlyn Ditto, MD;  Location: [facility];  Service: Urology;  Laterality: N/A;   finger torn tendon laceration     TONSILLECTOMY     TRANSURETHRAL RESECTION OF PROSTATE N/A 04/22/2020   Procedure: TRANSURETHRAL RESECTION OF THE PROSTATE (TURP);  Surgeon: Sherlyn Ditto, MD;  Location: [facility];  Service: Urology;  Laterality: N/A;  1 HR   Patient Active Problem List   Diagnosis Date Noted   Strain of right hamstring 10/26/2023   Acute cough 06/24/2023   PND (post-nasal drip) 06/24/2023   Body aches 06/24/2023   COVID-19 06/24/2023   Acute pain of right knee 09/24/2022   Abnormal urinalysis 08/11/2022   Urinary frequency 08/11/2022   Gross hematuria 08/11/2022   Acute prostatitis 08/11/2022   Right calf pain 01/13/2022   Tick bite of left thigh 01/13/2022   Strain of right calf muscle 10/26/2021   Family history of heart disease 08/07/2019   Erythema of lower extremity 07/30/2019   Elevated PSA 05/09/2018   Left leg pain 06/17/2017   BPH (benign prostatic hyperplasia) 04/29/2017   Encounter for annual general medical examination with abnormal findings in adult 04/12/2016   Varicose veins  of bilateral lower extremities with other complications 07/31/2013   Venous (peripheral) insufficiency 08/30/2008    PCP: Tretha Fu NP  REFERRING PROVIDER: Richrd Char MD  REFERRING DIAG: [phone] (ICD-10-CM) - Strain of right hamstring, initial encounter  THERAPY DIAG:  Pain in right leg  Muscle weakness (generalized)  Difficulty in walking, not elsewhere classified  Pain in right lower leg  Rationale for Evaluation and Treatment: Rehabilitation  ONSET DATE: 10/21/23  SUBJECTIVE:   SUBJECTIVE STATEMENT: Patient reports no further pain- able to return to everyday activities- walking, ADL's, transfers, moving around the home. 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 Initially started chipping to begin with then moved to the driving range working his way up to his irons. Felt a pop in his fore swing. Reports pain proximally near his ischial tuberosity and pain radiating down his hamstring. Reports difficulty walking and icing it. Initially very sharp pain. Has had some relief with icing it and can move a little better. Has no specific areas where he has pain, but reports in general with transfers and gait.  Pain relieved with ice and Tylenol . No bruising, no swelling reported. Has grabbed a compression sleeve/brace for his thigh. Denies N/T or other radicular symptoms.   PAIN:  Are  you having pain? Yes: NPRS scale: 5-6/10 NPS Pain location: R ischial tuberosity and hamstring down to medial/lateral knee Pain description: dull achey. Sharp initially  Aggravating factors: STS and gait Relieving factors: Tylenol , Ice  PRECAUTIONS: None  RED FLAGS: None   WEIGHT BEARING RESTRICTIONS: No  FALLS:  Has patient fallen in last 6 months? No  LIVING ENVIRONMENT: Lives with: lives with their spouse   OCCUPATION: Warehouse work?  PLOF: Independent  PATIENT GOALS:  Pain relief and return to pain free golf  NEXT MD VISIT: N/A  OBJECTIVE:  Note: Objective measures were completed at Evaluation unless otherwise noted.  DIAGNOSTIC FINDINGS: N/A  PATIENT SURVEYS:  LEFS 60/80  COGNITION: Overall cognitive status: Within functional limits for tasks assessed     SENSATION: WFL   MUSCLE LENGTH: Hamstrings SLR: Right 75 deg; Left 72 deg Hamstrings 90/90: Right: 20* deg; Left: 31* deg  POSTURE: No Significant postural limitations  PALPATION: TTP at R ischial tuberosity along hamstring tendon origin.   LOWER EXTREMITY ROM:  Active ROM Right eval Left eval  Hip flexion    Hip extension 5* 12  Hip abduction    Hip adduction    Hip internal rotation    Hip external rotation    Knee flexion    Knee extension    Ankle dorsiflexion    Ankle plantarflexion    Ankle inversion    Ankle eversion     (Blank rows = not tested)  LOWER EXTREMITY MMT:  MMT Right eval Left eval  Hip flexion 5 5  Hip extension 4* 5  Hip abduction    Hip adduction    Hip internal rotation 5 5  Hip external rotation 5 5  Knee flexion 4* 5  Knee extension 5 5  Ankle dorsiflexion 5 5  Ankle plantarflexion    Ankle inversion    Ankle eversion     (Blank rows = not tested)  LOWER EXTREMITY SPECIAL TESTS:  Hip special tests: Portia Brittle (FABER) test: negative and Trendelenburg test: positive   Hip FADDIR: Negative   FUNCTIONAL TESTS:  5 times sit to stand: 11.36 seconds  10 meter gait speed: .86 m/s GAIT: Distance walked: 10 meters Assistive device utilized: None Level of assistance: Complete Independence Comments: R/L trendelenburg gait. Antalgic on RLE.                                                                                                                                 TREATMENT DATE: 11/03/23   Physical therapy treatment session today consisted of completing assessment of goals and administration of testing as demonstrated and documented in  flow sheet, treatment, and goals section of this note. Addition treatments may be found below.   Self care/Home management:  Review of entire HEP at length- Patient with good understanding and able to demo SLS, hamstring stretching and prone/supine/standing activities.  Discussion of how hamstring heals and the fact that the scar tissue  with be less flexible and weaker than original skeletal muscle tissue.  Reviewed importance of maintaining flexibility and hamstring strength to prevent another strain.  PATIENT EDUCATION:  Education details: HEP, POC Person educated: Patient Education method: Explanation, Demonstration, and Handouts Education comprehension: verbalized understanding and needs further education  HOME EXERCISE PROGRAM:  Access Code: [email or web address] URL: [email or web address] Date: 11/03/2023 Prepared by: Ferrell Hu  Exercises - Supine Bridge  - 1 x daily - 7 x weekly - 3 sets - 6 reps - Sidelying Hip Abduction  - 1 x daily - 7 x weekly - 3 sets - 8 reps - Supine Lower Trunk Rotation  - 1 x daily - 7 x weekly - 2 sets - 12 reps - Seated Hamstring Stretch  - 1 x daily - 7 x weekly - 1 sets - 2 reps - 30 hold - Supine Isometric Hamstring Set  - 3 x weekly - 3 sets - 10 reps - Prone Knee Flexion  - 3 x weekly - 3 sets - 10 reps - Standing Hip Extension with Counter Support  - 3 x weekly - 3 sets - 10 reps - Standing Heel Raises  - 1 x daily - 3 x weekly - 3 sets - 10 reps        Access Code: [email or web address] URL: [email or web address] Date: 11/01/2023 Prepared by: Lyda Samples  Exercises - Supine Bridge  - 1 x daily - 7 x weekly - 3 sets - 6 reps - Sidelying Hip Abduction  - 1 x daily - 7 x weekly - 3 sets - 8 reps - Supine Lower Trunk Rotation  - 1 x daily - 7 x weekly - 2 sets - 12 reps - Seated Hamstring Stretch  - 1 x daily - 7 x weekly - 1 sets - 2 reps - 30 hold  ASSESSMENT:  CLINICAL IMPRESSION: Patient is a 83 y.o. male who was  seen today for physical therapy treatment for R hamstring strain. He presents with 1+ week of pain free symptoms and reports resumed normal daily activities. He presents today with perfect score on LEFS indicating a significant improvement in self perceived function as well as improved R Hamstring flexibility. He presents with good working knowledge of HEP and no questions or concerns. He is appropriate for discharge today with goals met.    OBJECTIVE IMPAIRMENTS: Abnormal gait, decreased mobility, difficulty walking, decreased ROM, decreased strength, impaired flexibility, and pain.   ACTIVITY LIMITATIONS: standing, squatting, and locomotion level  PARTICIPATION LIMITATIONS: community activity and occupation  PERSONAL FACTORS: Age, Fitness, Past/current experiences, Profession, and Time since onset of injury/illness/exacerbation are also affecting patient's functional outcome.   REHAB POTENTIAL: Good  CLINICAL DECISION MAKING: Stable/uncomplicated  EVALUATION COMPLEXITY: Low   GOALS: Goals reviewed with patient? No  SHORT TERM GOALS: Target date: 11/29/23 Pt will be independent with HEP to improve RLE strength and flexibility to return to PLOF.  Baseline: 11/01/23: Provided HEP; 11/15/2023= Patient verbalized and demonstrated independence with HEP Goal status: MET   LONG TERM GOALS: Target date: 12/27/23  Pt will improve LEFS by at least 9 points to demonstrate clinically significant improvement with ADL's.  Baseline: 60/80 Goal status: INITIAL  2.  Pt will improve pain free R hamstring flexibility equal to LLE to demonstrate return to symmetric mobility for gait and golfing tasks.  Baseline: 11/01/23:Hamstrings SLR: Right 75 deg; Left 72 deg Hamstrings 90/90: Right: 20* deg; Left: 31* deg 11/15/2023-Hamstrings SLR: Right 82 deg; Left 90 deg Hamstrings 90/90: Right: 18* deg;  Left: 21* deg:   Goal status: PARTIALLY MET- Improved   3.  Pt will report worst pain as a 2/10 NPS or less  to demonstrate clinically significant reduction in pain with ADL's. Baseline: 11/01/23: 5-6/10 NPS; 11/15/2023= Patient denies any pain over past week. Goal status: MET    PLAN:  PT FREQUENCY: 1-2x/week  PT DURATION: 8 weeks  PLANNED INTERVENTIONS: 97164- PT Re-evaluation, 97110-Therapeutic exercises, 97530- Therapeutic activity, 97112- Neuromuscular re-education, 97535- Self Care, 09811- Manual therapy, [id number]- Gait training, Patient/Family education, Balance training, Stair training, Dry Needling, Joint mobilization, Joint manipulation, Cryotherapy, and Moist heat  PLAN FOR NEXT SESSION: DISCHARGE TODAY  Ossie Blend, PT Physical Therapist- [facility]  [facility]  11/15/2023, 11:28 AM

## 2023-11-16 ENCOUNTER — Encounter

## 2023-11-18 ENCOUNTER — Ambulatory Visit

## 2023-11-22 ENCOUNTER — Ambulatory Visit

## 2023-11-25 ENCOUNTER — Ambulatory Visit: Admitting: Physical Therapy

## 2023-11-29 ENCOUNTER — Ambulatory Visit

## 2023-12-02 ENCOUNTER — Ambulatory Visit: Admitting: Physical Therapy

## 2023-12-05 ENCOUNTER — Ambulatory Visit: Admitting: Physical Therapy

## 2023-12-07 ENCOUNTER — Encounter

## 2023-12-09 ENCOUNTER — Encounter: Admitting: Physical Therapy

## 2023-12-10 ENCOUNTER — Ambulatory Visit
Admission: EM | Admit: 2023-12-10 | Discharge: 2023-12-10 | Disposition: A | Discharge status: Home / Self Care | Attending: Emergency Medicine | Admitting: Emergency Medicine

## 2023-12-10 DIAGNOSIS — M545 Low back pain, unspecified: Secondary | ICD-10-CM

## 2023-12-10 MED ORDER — BACLOFEN 5 MG PO TABS: 5.0000 mg | ORAL_TABLET | Freq: Every evening | ORAL | 0 refills | Status: DC | PRN

## 2023-12-10 MED ORDER — CELECOXIB 100 MG PO CAPS: 100.0000 mg | ORAL_CAPSULE | Freq: Two times a day (BID) | ORAL | 0 refills | Status: DC

## 2023-12-10 NOTE — ED Provider Notes (Signed)
 Cory Parker    CSN: [id number] Arrival date & time: 12/10/23  5621      History   Chief Complaint Chief Complaint  Patient presents with   Back Pain    HPI Cory Parker is a 83 y.o. male.   Patient presents for evaluation of left mid to lower back pain beginning 1 day ago without precipitating event, injury or trauma.  Pain is exacerbated by moving and can be felt with range of motion but able to do so.  Denies numbness or tingling, radiating pain, urinary or bowel incontinence.  Denies all urinary symptoms, shortness of breath, wheezing or URI symptoms.  Past Medical History:  Diagnosis Date   History of bronchitis    Varicose veins     Patient Active Problem List   Diagnosis Date Noted   Strain of right hamstring 10/26/2023   Acute cough 06/24/2023   PND (post-nasal drip) 06/24/2023   Body aches 06/24/2023   COVID-19 06/24/2023   Acute pain of right knee 09/24/2022   Abnormal urinalysis 08/11/2022   Urinary frequency 08/11/2022   Gross hematuria 08/11/2022   Acute prostatitis 08/11/2022   Right calf pain 01/13/2022   Tick bite of left thigh 01/13/2022   Strain of right calf muscle 10/26/2021   Family history of heart disease 08/07/2019   Erythema of lower extremity 07/30/2019   Elevated PSA 05/09/2018   Left leg pain 06/17/2017   BPH (benign prostatic hyperplasia) 04/29/2017   Encounter for annual general medical examination with abnormal findings in adult 04/12/2016   Varicose veins of bilateral lower extremities with other complications 07/31/2013   Venous (peripheral) insufficiency 08/30/2008    Past Surgical History:  Procedure Laterality Date   APPENDECTOMY     CYSTOSCOPY N/A 04/22/2020   Procedure: CYSTOSCOPY;  Surgeon: Sherlyn Ditto, MD;  Location: [facility];  Service: Urology;  Laterality: N/A;   finger torn tendon laceration     TONSILLECTOMY     TRANSURETHRAL RESECTION OF PROSTATE N/A 04/22/2020   Procedure: TRANSURETHRAL RESECTION  OF THE PROSTATE (TURP);  Surgeon: Sherlyn Ditto, MD;  Location: [facility];  Service: Urology;  Laterality: N/A;  1 HR       Home Medications    Prior to Admission medications   Medication Sig Start Date End Date Taking? Authorizing Provider  Baclofen 5 MG TABS Take 1 tablet (5 mg total) by mouth at bedtime as needed. 12/10/23  Yes Adham Johnson R, NP  celecoxib (CELEBREX) 100 MG capsule Take 1 capsule (100 mg total) by mouth 2 (two) times daily. 12/10/23  Yes Reena Canning, NP  Cholecalciferol 25 MCG (1000 UT) tablet Take 1,000 Units by mouth daily.     [provider]  finasteride  (PROSCAR ) 5 MG tablet  06/18/22   [provider]  mometasone  (NASONEX ) 50 MCG/ACT nasal spray Place 2 sprays into the nose daily. 06/24/23   Dorothe Gaster, NP  Multiple Vitamins-Minerals (MULTIVITAMIN WITH MINERALS) tablet Take 1 tablet by mouth daily.    [provider]  vitamin C (ASCORBIC ACID) 500 MG tablet Take 1,000 mg by mouth daily.     [provider]    Family History Family History  Problem Relation Age of Onset   Heart attack Father    Kidney disease Mother    Heart disease Mother     Social History Social History   Tobacco Use   Smoking status: Never   Smokeless tobacco: Never  Vaping Use  Vaping status: Never Used  Substance Use Topics   Alcohol use: Yes    Alcohol/week: 1.0 - 2.0 standard drink of alcohol    Types: 1 - 2 Cans of beer per week   Drug use: No     Allergies   Codeine    Review of Systems Review of Systems  Musculoskeletal:  Positive for back pain.     Physical Exam Triage Vital Signs ED Triage Vitals [12/10/23 0917]  Encounter Vitals Group     BP 135/75     Systolic BP Percentile      Diastolic BP Percentile      Pulse Rate 67     Resp 16     Temp (!) 97.5 F (36.4 C)     Temp Source Temporal     SpO2 97 %     Weight      Height      Head Circumference      Peak Flow      Pain Score 5     Pain Loc       Pain Education      Exclude from Growth Chart    No data found.  Updated Vital Signs BP 135/75 (BP Location: Left Arm)   Pulse 67   Temp (!) 97.5 F (36.4 C) (Temporal)   Resp 16   SpO2 97%   Visual Acuity Right Eye Distance:   Left Eye Distance:   Bilateral Distance:    Right Eye Near:   Left Eye Near:    Bilateral Near:     Physical Exam Constitutional:      Appearance: Normal appearance.  Eyes:     Extraocular Movements: Extraocular movements intact.  Pulmonary:     Effort: Pulmonary effort is normal.  Musculoskeletal:     Comments: Tenderness present to the thoracic and lumbar region of the left side of back, no spinal tenderness noted, able to sit erect without complication, able to twist turn and bend but pain is elicited with rotation, negative straight leg test  Neurological:     Mental Status: He is alert and oriented to person, place, and time.      UC Treatments / Results  Labs (all labs ordered are listed, but only abnormal results are displayed) Labs Reviewed - No data to display  EKG   Radiology No results found.  Procedures Procedures (including critical care time)  Medications Ordered in UC Medications - No data to display  Initial Impression / Assessment and Plan / UC Course  I have reviewed the triage vital signs and the nursing notes.  Pertinent labs & imaging results that were available during my care of the patient were reviewed by me and considered in my medical decision making (see chart for details).  Acute left-sided low back pain without sciatica  Etiology most likely muscular, denies injury therefore imaging deferred, stable, prescribed Celebrex and baclofen recommended supportive care through [facility], heat massage stretching with activity as tolerated, advise follow-up with primary doctor if symptoms continue to persist or worsen Final Clinical Impressions(s) / UC Diagnoses   Final diagnoses:  Acute left-sided low back  pain without sciatica   Discharge Instructions      Your pain is most likely caused by irritation to the muscles  Take Celebrex twice daily to help reduce inflammation and help with pain, may take Tylenol  additionally  You may take baclofen as a muscle relaxer at bedtime if you sleep  You may use heating pad in 15  minute intervals as needed for additional comfort, within the first 2-3 days you may find comfort in using ice in 10-15 minutes over affected area  Begin stretching affected area daily for 10 minutes as tolerated to further loosen muscles   When lying down place pillow underneath and between knees for support  Can try sleeping without pillow on firm mattress   Practice good posture: head back, shoulders back, chest forward, pelvis back and weight distributed evenly on both legs  If pain persist after recommended treatment or reoccurs if may be beneficial to follow up with orthopedic specialist for evaluation, this doctor specializes in the bones and can manage your symptoms long-term with options such as but not limited to imaging, medications or physical therapy     ED Prescriptions     Medication Sig Dispense Auth. Provider   celecoxib (CELEBREX) 100 MG capsule Take 1 capsule (100 mg total) by mouth 2 (two) times daily. 30 capsule Nicholl Onstott R, NP   Baclofen 5 MG TABS Take 1 tablet (5 mg total) by mouth at bedtime as needed. 10 tablet Edwen Mclester R, NP      PDMP not reviewed this encounter.   Reena Canning, NP 12/10/23 1104

## 2023-12-10 NOTE — Discharge Instructions (Signed)
 Your pain is most likely caused by irritation to the muscles  Take Celebrex twice daily to help reduce inflammation and help with pain, may take Tylenol  additionally  You may take baclofen as a muscle relaxer at bedtime if you sleep  You may use heating pad in 15 minute intervals as needed for additional comfort, within the first 2-3 days you may find comfort in using ice in 10-15 minutes over affected area  Begin stretching affected area daily for 10 minutes as tolerated to further loosen muscles   When lying down place pillow underneath and between knees for support  Can try sleeping without pillow on firm mattress   Practice good posture: head back, shoulders back, chest forward, pelvis back and weight distributed evenly on both legs  If pain persist after recommended treatment or reoccurs if may be beneficial to follow up with orthopedic specialist for evaluation, this doctor specializes in the bones and can manage your symptoms long-term with options such as but not limited to imaging, medications or physical therapy

## 2023-12-10 NOTE — ED Triage Notes (Signed)
 Patient presents to UC for low back pain x 1 day. No injury or urinary symptoms. States taking tylenol  with some relief.

## 2023-12-12 ENCOUNTER — Encounter: Admitting: Physical Therapy

## 2023-12-14 ENCOUNTER — Encounter

## 2023-12-16 ENCOUNTER — Encounter: Admitting: Physical Therapy

## 2023-12-19 ENCOUNTER — Encounter: Admitting: Physical Therapy

## 2023-12-21 ENCOUNTER — Encounter

## 2023-12-23 ENCOUNTER — Encounter: Admitting: Physical Therapy

## 2023-12-27 ENCOUNTER — Encounter

## 2023-12-30 ENCOUNTER — Encounter: Admitting: Physical Therapy

## 2024-01-02 ENCOUNTER — Encounter: Admitting: Physical Therapy

## 2024-01-04 ENCOUNTER — Encounter

## 2024-01-10 ENCOUNTER — Encounter

## 2024-01-11 ENCOUNTER — Ambulatory Visit: Admitting: Primary Care

## 2024-01-11 VITALS — BP 110/60 | HR 62 | Temp 98.1°F | Ht 68.5 in | Wt 233.0 lb

## 2024-01-11 DIAGNOSIS — Z0000 Encounter for general adult medical examination without abnormal findings: Secondary | ICD-10-CM | POA: Diagnosis not present

## 2024-01-11 DIAGNOSIS — S76311S Strain of muscle, fascia and tendon of the posterior muscle group at thigh level, right thigh, sequela: Secondary | ICD-10-CM

## 2024-01-11 DIAGNOSIS — Z6834 Body mass index (BMI) 34.0-34.9, adult: Secondary | ICD-10-CM

## 2024-01-11 DIAGNOSIS — I872 Venous insufficiency (chronic) (peripheral): Secondary | ICD-10-CM

## 2024-01-11 DIAGNOSIS — N400 Enlarged prostate without lower urinary tract symptoms: Secondary | ICD-10-CM | POA: Diagnosis not present

## 2024-01-11 DIAGNOSIS — E6609 Other obesity due to excess calories: Secondary | ICD-10-CM | POA: Diagnosis not present

## 2024-01-11 DIAGNOSIS — E66811 Obesity, class 1: Secondary | ICD-10-CM

## 2024-01-11 LAB — LIPID PANEL
Cholesterol: 160 mg/dL (ref 0–200)
HDL: 49.5 mg/dL (ref >39.00)
LDL Cholesterol: 87 mg/dL (ref 0–99)
NonHDL: 110.29
Total CHOL/HDL Ratio: 3
Triglycerides: 114 mg/dL (ref 0.0–149.0)
VLDL: 22.8 mg/dL (ref 0.0–40.0)

## 2024-01-11 LAB — COMPREHENSIVE METABOLIC PANEL WITH GFR
ALT: 12 U/L (ref 0–53)
AST: 14 U/L (ref 0–37)
Albumin: 4.2 g/dL (ref 3.5–5.2)
Alkaline Phosphatase: 68 U/L (ref 39–117)
BUN: 19 mg/dL (ref 6–23)
CO2: 29 meq/L (ref 19–32)
Calcium: 9.1 mg/dL (ref 8.4–10.5)
Chloride: 103 meq/L (ref 96–112)
Creatinine, Ser: 1.11 mg/dL (ref 0.40–1.50)
GFR: 62.47 mL/min (ref >60.00)
Glucose, Bld: 80 mg/dL (ref 70–99)
Potassium: 4.5 meq/L (ref 3.5–5.1)
Sodium: 138 meq/L (ref 135–145)
Total Bilirubin: 1 mg/dL (ref 0.2–1.2)
Total Protein: 6.2 g/dL (ref 6.0–8.3)

## 2024-01-11 LAB — CBC
HCT: 45.6 % (ref 39.0–52.0)
Hemoglobin: 15.9 g/dL (ref 13.0–17.0)
MCHC: 34.9 g/dL (ref 30.0–36.0)
MCV: 89.9 fl (ref 78.0–100.0)
Platelets: 163 K/uL (ref 150.0–400.0)
RBC: 5.08 Mil/uL (ref 4.22–5.81)
RDW: 13.2 % (ref 11.5–15.5)
WBC: 6.2 K/uL (ref 4.0–10.5)

## 2024-01-11 NOTE — Assessment & Plan Note (Signed)
 Stable. No concerns today.  Continue compression socks, elevation. No longer following with vascular services.

## 2024-01-11 NOTE — Assessment & Plan Note (Signed)
 Resolved  Completed PT

## 2024-01-11 NOTE — Assessment & Plan Note (Signed)
 Discussed pneumonia, tetanus and shingles vaccines.   Discussed the importance of a healthy diet and regular exercise in order for weight loss, and to reduce the risk of further co-morbidity.  Exam stable. Labs pending.  Follow up in 1 year for repeat physical.

## 2024-01-11 NOTE — Patient Instructions (Signed)
 Stop by the lab prior to leaving today. I will notify you of your results once received.   It was a pleasure to see you today!

## 2024-01-11 NOTE — Assessment & Plan Note (Signed)
 Stable.  Continue finasteride  5 mg daily. Following with urology.

## 2024-01-11 NOTE — Progress Notes (Signed)
 Subjective:    Patient ID: Cory Parker, male    DOB: 07-19-1940, 83 y.o.   MRN: [id number]  HPI  Cory Parker is a very pleasant 83 y.o. male who presents today for complete physical and follow up of chronic conditions.  Immunizations: -Tetanus: Completed in 2015 -Shingles: Completed Zostavax -Pneumonia: Completed Prevnar 13 in 2015, Pneumovax 23 2014  Diet: Fair diet.  Exercise: No regular exercise.  Eye exam: Completes annually  Dental exam: Completes semi-annually    Colonoscopy: Completed in 2010, N/A now given age.  PSA: Due  BP Readings from Last 3 Encounters:  01/11/24 110/60  12/10/23 135/75  10/24/23 128/66         Review of Systems  Constitutional:  Negative for unexpected weight change.  HENT:  Negative for rhinorrhea.   Respiratory:  Negative for cough and shortness of breath.   Cardiovascular:  Negative for chest pain.  Gastrointestinal:  Negative for constipation and diarrhea.  Genitourinary:  Negative for difficulty urinating.  Musculoskeletal:  Positive for arthralgias.  Skin:  Negative for rash.  Allergic/Immunologic: Negative for environmental allergies.  Neurological:  Negative for dizziness and headaches.  Psychiatric/Behavioral:  The patient is not nervous/anxious.          Past Medical History:  Diagnosis Date   Acute pain of right knee 09/24/2022   Acute prostatitis 08/11/2022   COVID-19 06/24/2023   Erythema of lower extremity 07/30/2019   Gross hematuria 08/11/2022   History of bronchitis    Left leg pain 06/17/2017   Urinary frequency 08/11/2022   Varicose veins     Social History   Socioeconomic History   Marital status: Married    Spouse name: Not on file   Number of children: Not on file   Years of education: Not on file   Highest education level: Bachelor's degree (e.g., BA, AB, BS)  Occupational History   Not on file  Tobacco Use   Smoking status: Never   Smokeless tobacco: Never  Vaping Use    Vaping status: Never Used  Substance and Sexual Activity   Alcohol use: Yes    Alcohol/week: 1.0 - 2.0 standard drink of alcohol    Types: 1 - 2 Cans of beer per week   Drug use: No   Sexual activity: Not Currently    Birth control/protection: None    Comment: none of your business  Other Topics Concern   Not on file  Social History Narrative   Not on file   Social Drivers of Health   Financial Resource Strain: Low Risk  (01/11/2024)   Overall Financial Resource Strain (CARDIA)    Difficulty of Paying Living Expenses: Not hard at all  Food Insecurity: No Food Insecurity (01/11/2024)   Hunger Vital Sign    Worried About Running Out of Food in the Last Year: Never true    Ran Out of Food in the Last Year: Never true  Transportation Needs: No Transportation Needs (01/11/2024)   PRAPARE - Administrator, Civil Service (Medical): No    Lack of Transportation (Non-Medical): No  Physical Activity: Sufficiently Active (01/11/2024)   Exercise Vital Sign    Days of Exercise per Week: 5 days    Minutes of Exercise per Session: 50 min  Stress: No Stress Concern Present (01/11/2024)   [employer] of Occupational Health - Occupational Stress Questionnaire    Feeling of Stress: Only a little  Social Connections: Unknown (01/11/2024)   Social Connection and Isolation  Panel    Frequency of Communication with Friends and Family: Twice a week    Frequency of Social Gatherings with Friends and Family: Patient declined    Attends Religious Services: Patient declined    Active Member of Clubs or Organizations: Patient declined    Attends Banker Meetings: Not on file    Marital Status: Married  Intimate Partner Violence: Not At Risk (05/11/2023)   Humiliation, Afraid, Rape, and Kick questionnaire    Fear of Current or Ex-Partner: No    Emotionally Abused: No    Physically Abused: No    Sexually Abused: No    Past Surgical History:  Procedure Laterality Date    APPENDECTOMY  1956   CYSTOSCOPY N/A 04/22/2020   Procedure: CYSTOSCOPY;  Surgeon: Rosalind Zachary NOVAK, MD;  Location: [facility];  Service: Urology;  Laterality: N/A;   finger torn tendon laceration     TONSILLECTOMY     TRANSURETHRAL RESECTION OF PROSTATE N/A 04/22/2020   Procedure: TRANSURETHRAL RESECTION OF THE PROSTATE (TURP);  Surgeon: Rosalind Zachary NOVAK, MD;  Location: [facility];  Service: Urology;  Laterality: N/A;  1 HR    Family History  Problem Relation Age of Onset   Heart attack Father    Heart disease Father    Early death Father    Kidney disease Mother    Heart disease Mother     Allergies  Allergen Reactions   Codeine  Other (See Comments)    unknown    Current Outpatient Medications on File Prior to Visit  Medication Sig Dispense Refill   Cholecalciferol 25 MCG (1000 UT) tablet Take 1,000 Units by mouth daily.      finasteride  (PROSCAR ) 5 MG tablet      mometasone  (NASONEX ) 50 MCG/ACT nasal spray Place 2 sprays into the nose daily. (Patient taking differently: Place 2 sprays into the nose as needed.) 1 each 0   Multiple Vitamins-Minerals (MULTIVITAMIN WITH MINERALS) tablet Take 1 tablet by mouth daily.     vitamin C (ASCORBIC ACID) 500 MG tablet Take 1,000 mg by mouth daily.      No current facility-administered medications on file prior to visit.    BP 110/60   Pulse 62   Temp 98.1 F (36.7 C) (Oral)   Ht 5' 8.5 (1.74 m)   Wt 233 lb (105.7 kg)   SpO2 95%   BMI 34.91 kg/m  Objective:   Physical Exam HENT:     Right Ear: Tympanic membrane and ear canal normal.     Left Ear: Tympanic membrane and ear canal normal.  Eyes:     Pupils: Pupils are equal, round, and reactive to light.  Cardiovascular:     Rate and Rhythm: Normal rate and regular rhythm.  Pulmonary:     Effort: Pulmonary effort is normal.     Breath sounds: Normal breath sounds.  Abdominal:     General: Bowel sounds are normal.     Palpations: Abdomen is soft.     Tenderness: There is no  abdominal tenderness.  Musculoskeletal:        General: Normal range of motion.     Cervical back: Neck supple.  Skin:    General: Skin is warm and dry.  Neurological:     Mental Status: He is alert and oriented to person, place, and time.     Cranial Nerves: No cranial nerve deficit.     Deep Tendon Reflexes:     Reflex Scores:  Patellar reflexes are 2+ on the right side and 2+ on the left side. Psychiatric:        Mood and Affect: Mood normal.           Assessment & Plan:  Preventative health care Assessment & Plan: Discussed pneumonia, tetanus and shingles vaccines.   Discussed the importance of a healthy diet and regular exercise in order for weight loss, and to reduce the risk of further co-morbidity.  Exam stable. Labs pending.  Follow up in 1 year for repeat physical.    Benign prostatic hyperplasia without lower urinary tract symptoms Assessment & Plan: Stable.  Continue finasteride  5 mg daily. Following with urology.   Orders: -     Comprehensive metabolic panel with GFR -     CBC  Strain of right hamstring, sequela Assessment & Plan: Resolved.   Completed PT   Venous (peripheral) insufficiency Assessment & Plan: Stable. No concerns today.  Continue compression socks, elevation. No longer following with vascular services.    Class 1 obesity due to excess calories without serious comorbidity with body mass index (BMI) of 34.0 to 34.9 in adult -     Lipid panel        Comer MARLA Gaskins, NP

## 2024-01-12 ENCOUNTER — Ambulatory Visit: Payer: Self-pay | Admitting: Primary Care

## 2024-01-12 ENCOUNTER — Encounter

## 2024-01-17 ENCOUNTER — Encounter

## 2024-01-20 ENCOUNTER — Encounter

## 2024-01-23 ENCOUNTER — Encounter

## 2024-01-26 ENCOUNTER — Encounter

## 2024-01-30 ENCOUNTER — Encounter

## 2024-02-01 ENCOUNTER — Encounter

## 2024-02-02 ENCOUNTER — Encounter: Admitting: Physical Therapy

## 2024-02-06 ENCOUNTER — Encounter

## 2024-02-08 ENCOUNTER — Encounter

## 2024-02-09 ENCOUNTER — Encounter: Admitting: Physical Therapy

## 2024-02-14 DIAGNOSIS — H2513 Age-related nuclear cataract, bilateral: Secondary | ICD-10-CM | POA: Diagnosis not present

## 2024-03-09 DIAGNOSIS — R310 Gross hematuria: Secondary | ICD-10-CM | POA: Diagnosis not present

## 2024-03-09 DIAGNOSIS — R350 Frequency of micturition: Secondary | ICD-10-CM | POA: Diagnosis not present

## 2024-05-11 ENCOUNTER — Ambulatory Visit: Payer: Medicare Other

## 2024-05-11 VITALS — Ht 68.5 in | Wt 233.0 lb

## 2024-05-11 DIAGNOSIS — Z0000 Encounter for general adult medical examination without abnormal findings: Secondary | ICD-10-CM

## 2024-05-11 NOTE — Progress Notes (Addendum)
 Subjective:   Cory Parker is a 83 y.o. male who presents for a Medicare Annual Wellness Visit.  I connected with  Cory Parker on 05/11/24 by a audio enabled telemedicine application and verified that I am speaking with the correct person using two identifiers.  Patient Location: Home  Provider Location: Office/Clinic  Persons Participating in Visit: Patient.  I discussed the limitations of evaluation and management by telemedicine. The patient expressed understanding and agreed to proceed.  Vital Signs: Because this visit was a virtual/telehealth visit, some criteria may be missing or patient reported. Any vitals not documented were not able to be obtained and vitals that have been documented are patient reported.   Allergies (verified) Codeine    History: Past Medical History:  Diagnosis Date   Acute pain of right knee 09/24/2022   Acute prostatitis 08/11/2022   COVID-19 06/24/2023   Erythema of lower extremity 07/30/2019   Gross hematuria 08/11/2022   History of bronchitis    Left leg pain 06/17/2017   Urinary frequency 08/11/2022   Varicose veins    Past Surgical History:  Procedure Laterality Date   APPENDECTOMY  1956   CYSTOSCOPY N/A 04/22/2020   Procedure: CYSTOSCOPY;  Surgeon: Rosalind Zachary NOVAK, MD;  Location: [facility];  Service: Urology;  Laterality: N/A;   finger torn tendon laceration     TONSILLECTOMY     TRANSURETHRAL RESECTION OF PROSTATE N/A 04/22/2020   Procedure: TRANSURETHRAL RESECTION OF THE PROSTATE (TURP);  Surgeon: Rosalind Zachary NOVAK, MD;  Location: [facility];  Service: Urology;  Laterality: N/A;  1 HR   Family History  Problem Relation Age of Onset   Heart attack Father    Heart disease Father    Early death Father    Kidney disease Mother    Heart disease Mother    Social History   Occupational History   Not on file  Tobacco Use   Smoking status: Never   Smokeless tobacco: Never  Vaping Use   Vaping status: Never Used  Substance  and Sexual Activity   Alcohol use: Yes    Alcohol/week: 1.0 - 2.0 standard drink of alcohol    Types: 1 - 2 Cans of beer per week   Drug use: No   Sexual activity: Not Currently    Birth control/protection: None    Comment: none of your business   Tobacco Counseling Counseling given: Not Answered  SDOH Screenings   Food Insecurity: No Food Insecurity (05/11/2024)  Housing: Unknown (05/11/2024)  Transportation Needs: No Transportation Needs (05/11/2024)  Utilities: Not At Risk (05/11/2024)  Alcohol Screen: Low Risk  (01/11/2024)  Depression (PHQ2-9): Low Risk  (05/11/2024)  Financial Resource Strain: Low Risk  (01/11/2024)  Physical Activity: Sufficiently Active (05/11/2024)  Social Connections: Moderately Integrated (05/11/2024)  Stress: No Stress Concern Present (05/11/2024)  Tobacco Use: Low Risk  (05/11/2024)  Health Literacy: Adequate Health Literacy (05/11/2024)   Depression Screen    05/11/2024    3:57 PM 01/11/2024   11:34 AM 10/24/2023    9:49 AM 06/24/2023   12:00 PM 05/11/2023    2:36 PM 04/29/2022    1:08 PM 05/09/2019    9:12 AM  PHQ 2/9 Scores  PHQ - 2 Score 0 0 0 0 0 0 0  PHQ- 9 Score  0   0   1  0      Data saved with a previous flowsheet row definition     Goals Addressed  This Visit's Progress    I would like to keep going and lose some weight       COMPLETED: Patient Stated       05/09/2019, I will try to increase my exercise level as soon as the pandemic gets under control.        Visit info / Clinical Intake: Medicare Wellness Visit Type:: Subsequent Annual Wellness Visit Medicare Wellness Visit Mode:: Telephone If telephone:: video declined If telephone or video:: unable to obtan vitals due to lack of equipment Interpreter Needed?: No Pre-visit prep was completed: yes AWV questionnaire completed by patient prior to visit?: no Living arrangements:: lives with spouse/significant other Patient's Overall Health Status Rating: excellent Typical  amount of pain: none Does pain affect daily life?: no Are you currently prescribed opioids?: no  Dietary Habits and Nutritional Risks How many meals a day?: 3 Eats fruit and vegetables daily?: yes (fruit everyday;vegetables 2-3 times weekly) Most meals are obtained by: preparing own meals; eating out (half and half) In the last 2 weeks, have you had any of the following?: -- (none) Diabetic:: no  Functional Status Activities of Daily Living (to include ambulation/medication): Independent Ambulation: Independent Medication Administration: Independent Home Management: Independent Manage your own finances?: yes Primary transportation is: driving Concerns about vision?: no *vision screening is required for WTM* Concerns about hearing?: (!) yes Uses hearing aids?: (!) yes Hear whispered voice?: (!) no *in-person visit only*  Fall Screening Falls in the past year?: 0 Number of falls in past year: 0 Was there an injury with Fall?: 0 Fall Risk Category Calculator: 0 Patient Fall Risk Level: Low Fall Risk  Fall Risk Patient at Risk for Falls Due to: No Fall Risks Fall risk Follow up: Education provided; Falls prevention discussed  Home and Transportation Safety: All rugs have non-skid backing?: N/A, no rugs All stairs or steps have railings?: yes Grab bars in the bathtub or shower?: (!) no Have non-skid surface in bathtub or shower?: (!) no Good home lighting?: yes Regular seat belt use?: yes Hospital stays in the last year:: no  Cognitive Assessment Difficulty concentrating, remembering, or making decisions? : no Will 6CIT or Mini Cog be Completed: yes What year is it?: 0 points What month is it?: 0 points Give patient an address phrase to remember (5 components): [address] [place]  About what time is it?: 0 points Count backwards from 20 to 1: 0 points Say the months of the year in reverse: 0 points Repeat the address phrase from earlier: 0 points 6 CIT  Score: 0 points  Advance Directives (For Healthcare) Does Patient Have a Medical Advance Directive?: Yes Does patient want to make changes to medical advance directive?: Yes (Inpatient - patient defers changing a medical advance directive and declines information at this time) Type of Advance Directive: Healthcare Power of [place]; Living will Copy of Healthcare Power of Attorney in Chart?: No - copy requested Copy of Living Will in Chart?: No - copy requested  Reviewed/Updated  Reviewed/Updated: All        Objective:    Today's Vitals   05/11/24 1546  Weight: 233 lb (105.7 kg)  Height: 5' 8.5 (1.74 m)   Body mass index is 34.91 kg/m.  Current Medications (verified) Outpatient Encounter Medications as of 05/11/2024  Medication Sig   Cholecalciferol 25 MCG (1000 UT) tablet Take 1,000 Units by mouth daily.    finasteride  (PROSCAR ) 5 MG tablet    mometasone  (NASONEX ) 50 MCG/ACT nasal spray Place 2 sprays  into the nose daily.   Multiple Vitamins-Minerals (MULTIVITAMIN WITH MINERALS) tablet Take 1 tablet by mouth daily.   vitamin C (ASCORBIC ACID) 500 MG tablet Take 1,000 mg by mouth daily.    No facility-administered encounter medications on file as of 05/11/2024.   Hearing/Vision screen Vision Screening - Comments:: UTD w/visits [facility] Immunizations and Health Maintenance Health Maintenance  Topic Date Due   Zoster Vaccines- Shingrix (1 of 2) 12/14/1992   COVID-19 Vaccine (8 - 2025-26 season) 03/05/2024   DTaP/Tdap/Td (3 - Tdap) 03/28/2024   Medicare Annual Wellness (AWV)  05/10/2024   Pneumococcal Vaccine: 50+ Years  Completed   Influenza Vaccine  Completed   Meningococcal B Vaccine  Aged Out   Colonoscopy  Discontinued        Assessment/Plan:  This is a routine wellness examination for Neo.  Patient Care Team: Gretta Comer POUR, NP as PCP - General (Internal Medicine) [facility], [place], [place]  I have personally reviewed and noted the  following in the patient's chart:   Medical and social history Use of alcohol, tobacco or illicit drugs  Current medications and supplements including opioid prescriptions. Functional ability and status Nutritional status Physical activity Advanced directives List of other physicians Hospitalizations, surgeries, and ER visits in previous 12 months Vitals Screenings to include cognitive, depression, and falls Referrals and appointments  No orders of the defined types were placed in this encounter.  In addition, I have reviewed and discussed with patient certain preventive protocols, quality metrics, and best practice recommendations. A written personalized care plan for preventive services as well as general preventive health recommendations were provided to patient.   Erminio LITTIE Saris, LPN   88/08/[id number]   No follow-ups on file.  After Visit Summary: (MyChart) Due to this being a telephonic visit, the after visit summary with patients personalized plan was offered to patient via MyChart   Nurse Notes: Pt has no concerns or questions. AWV made for one year

## 2024-05-11 NOTE — Patient Instructions (Signed)
 Mr. Cory Parker,  Thank you for taking the time for your Medicare Wellness Visit. I appreciate your continued commitment to your health goals. Please review the care plan we discussed, and feel free to reach out if I can assist you further.  Please note that Annual Wellness Visits do not include a physical exam. Some assessments may be limited, especially if the visit was conducted virtually. If needed, we may recommend an in-person follow-up with your provider.  Ongoing Care Seeing your primary care provider every 3 to 6 months helps us  monitor your health and provide consistent, personalized care.   Referrals If a referral was made during today's visit and you haven't received any updates within two weeks, please contact the referred provider directly to check on the status.  Recommended Screenings:  Health Maintenance  Topic Date Due   Zoster (Shingles) Vaccine (1 of 2) 12/14/1992   COVID-19 Vaccine (8 - 2025-26 season) 03/05/2024   DTaP/Tdap/Td vaccine (3 - Tdap) 03/28/2024   Medicare Annual Wellness Visit  05/10/2024   Pneumococcal Vaccine for age over 46  Completed   Flu Shot  Completed   Meningitis B Vaccine  Aged Out   Colon Cancer Screening  Discontinued       05/11/2024    3:48 PM  Advanced Directives  Does Patient Have a Medical Advance Directive? Yes  Type of Estate Agent of [place];Living will  Copy of Healthcare Power of Attorney in Chart? No - copy requested    Vision: Annual vision screenings are recommended for early detection of glaucoma, cataracts, and diabetic retinopathy. These exams can also reveal signs of chronic conditions such as diabetes and high blood pressure.  Dental: Annual dental screenings help detect early signs of oral cancer, gum disease, and other conditions linked to overall health, including heart disease and diabetes.

## 2025-01-11 ENCOUNTER — Encounter: Admitting: Primary Care

## 2025-05-14 ENCOUNTER — Ambulatory Visit
